# Patient Record
Sex: Female | Born: 1950
Health system: Southern US, Community
[De-identification: ages and names within clinical notes are randomized; demographics above are authoritative.]

## PROBLEM LIST (undated history)

## (undated) DIAGNOSIS — Z973 Presence of spectacles and contact lenses: Secondary | ICD-10-CM

## (undated) DIAGNOSIS — R011 Cardiac murmur, unspecified: Secondary | ICD-10-CM

## (undated) DIAGNOSIS — E669 Obesity, unspecified: Secondary | ICD-10-CM

## (undated) DIAGNOSIS — H905 Unspecified sensorineural hearing loss: Secondary | ICD-10-CM

## (undated) DIAGNOSIS — IMO0001 Reserved for inherently not codable concepts without codable children: Secondary | ICD-10-CM

## (undated) DIAGNOSIS — J45909 Unspecified asthma, uncomplicated: Secondary | ICD-10-CM

## (undated) DIAGNOSIS — G43909 Migraine, unspecified, not intractable, without status migrainosus: Secondary | ICD-10-CM

## (undated) DIAGNOSIS — E039 Hypothyroidism, unspecified: Secondary | ICD-10-CM

## (undated) DIAGNOSIS — M1990 Unspecified osteoarthritis, unspecified site: Secondary | ICD-10-CM

## (undated) DIAGNOSIS — Z972 Presence of dental prosthetic device (complete) (partial): Secondary | ICD-10-CM

## (undated) DIAGNOSIS — T753XXA Motion sickness, initial encounter: Secondary | ICD-10-CM

## (undated) DIAGNOSIS — I10 Essential (primary) hypertension: Secondary | ICD-10-CM

## (undated) DIAGNOSIS — K219 Gastro-esophageal reflux disease without esophagitis: Secondary | ICD-10-CM

## (undated) HISTORY — PX: CERVICAL CONE BIOPSY: SUR198

## (undated) HISTORY — DX: Obesity, unspecified: E66.9

---

## 2007-09-03 ENCOUNTER — Ambulatory Visit: Payer: Self-pay | Admitting: Nurse Practitioner

## 2007-09-04 ENCOUNTER — Ambulatory Visit: Payer: Self-pay | Admitting: Internal Medicine

## 2008-12-14 ENCOUNTER — Ambulatory Visit: Payer: Self-pay | Admitting: Internal Medicine

## 2009-12-15 ENCOUNTER — Ambulatory Visit: Payer: Self-pay | Admitting: Internal Medicine

## 2010-09-18 ENCOUNTER — Ambulatory Visit: Payer: Self-pay | Admitting: Gastroenterology

## 2013-10-29 ENCOUNTER — Ambulatory Visit: Payer: Self-pay | Admitting: Gastroenterology

## 2013-10-29 LAB — BASIC METABOLIC PANEL
Anion Gap: 11 (ref 7–16)
BUN: 15 mg/dL (ref 7–18)
Chloride: 108 mmol/L — ABNORMAL HIGH (ref 98–107)
Co2: 21 mmol/L (ref 21–32)
Osmolality: 281 (ref 275–301)
Potassium: 3.6 mmol/L (ref 3.5–5.1)
Sodium: 140 mmol/L (ref 136–145)

## 2013-10-29 LAB — CBC
HCT: 38.9 % (ref 35.0–47.0)
HGB: 13.3 g/dL (ref 12.0–16.0)
MCH: 29.9 pg (ref 26.0–34.0)
MCHC: 34.1 g/dL (ref 32.0–36.0)
MCV: 88 fL (ref 80–100)
RBC: 4.43 10*6/uL (ref 3.80–5.20)
RDW: 13.2 % (ref 11.5–14.5)
WBC: 11.4 10*3/uL — ABNORMAL HIGH (ref 3.6–11.0)

## 2013-10-29 LAB — CK TOTAL AND CKMB (NOT AT ARMC): CK, Total: 193 U/L (ref 21–215)

## 2013-11-11 ENCOUNTER — Ambulatory Visit: Payer: Self-pay | Admitting: Internal Medicine

## 2014-04-02 HISTORY — PX: THROAT SURGERY: SHX803

## 2014-08-26 IMAGING — MG MM DIGITAL SCREENING BILAT W/ CAD
1 series · 4 of 4 positions shown · non-contrast
Comparison: Previous exam(s).

CLINICAL DATA: Screening.

EXAM:
DIGITAL SCREENING BILATERAL MAMMOGRAM WITH CAD

[R CC · right · 4 of 4 slices shown]
[im 1/4]
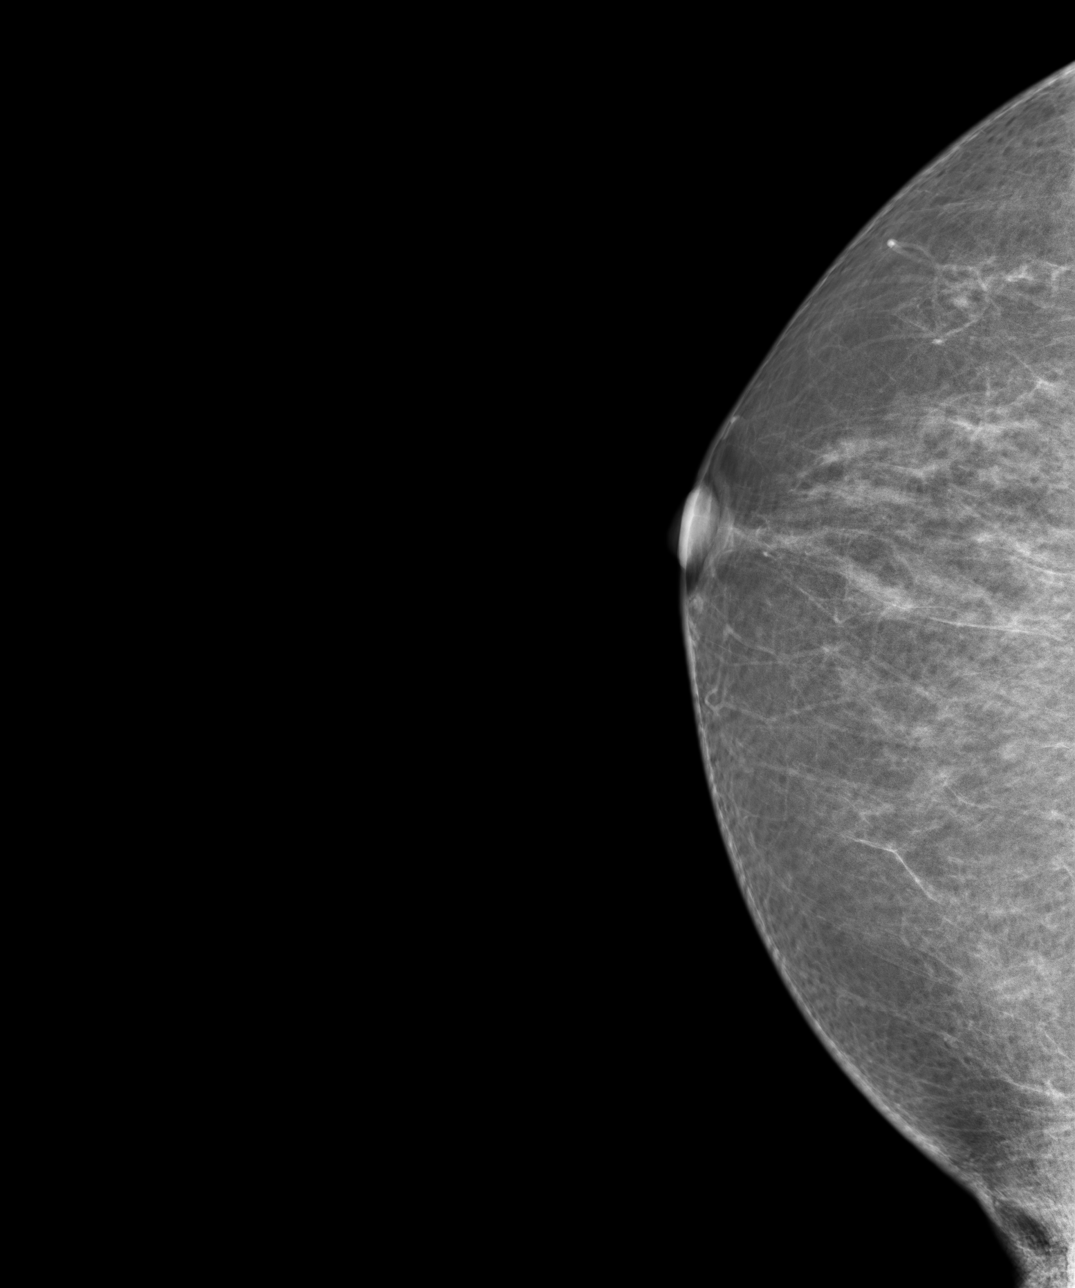
[im 2/4]
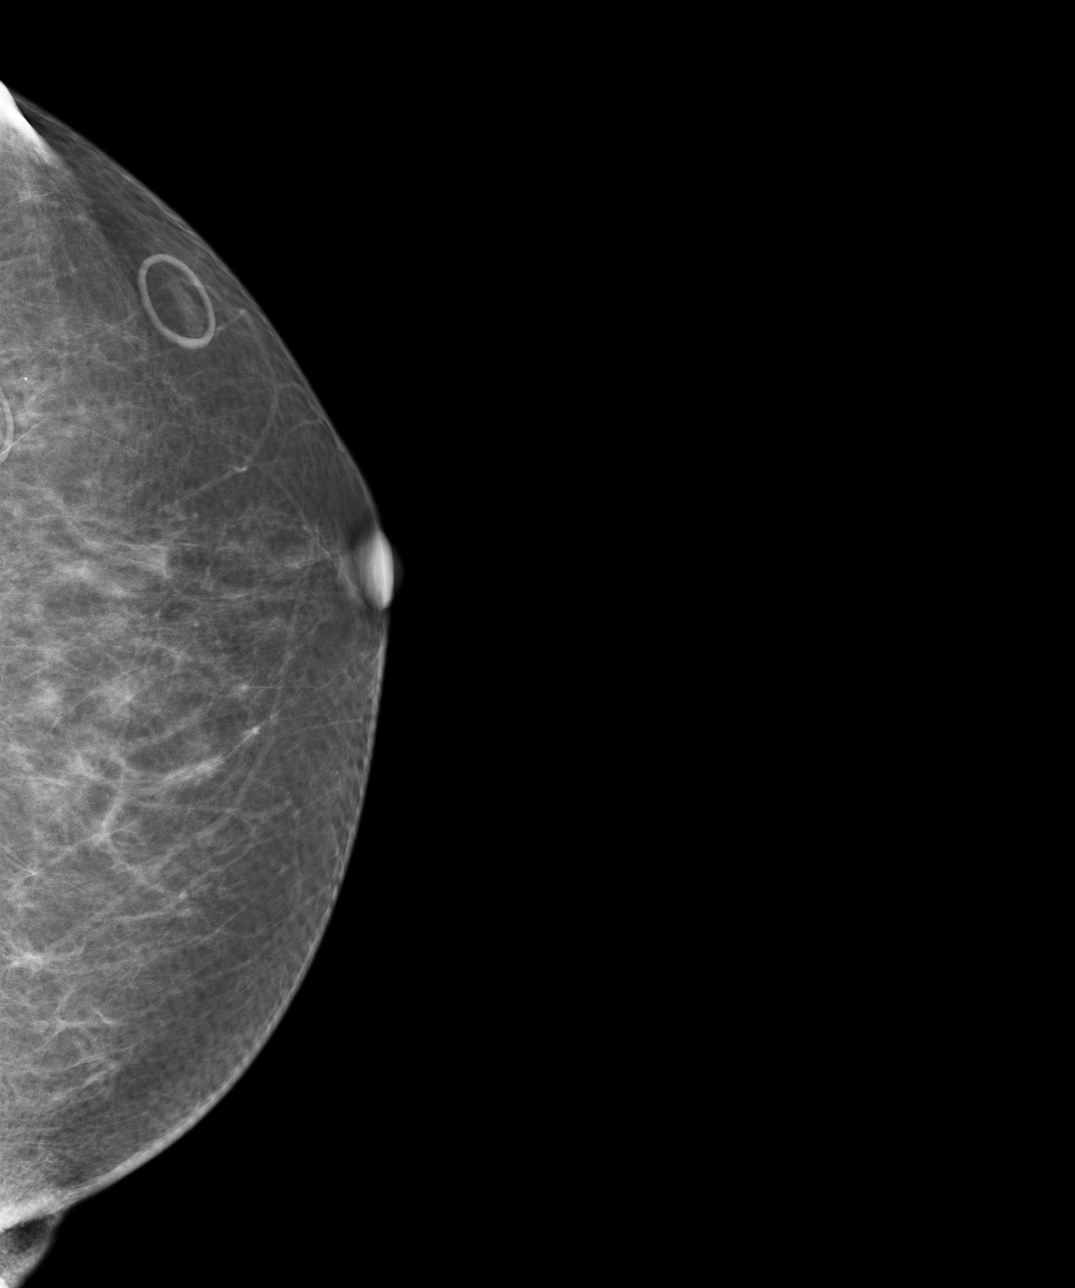
[im 3/4]
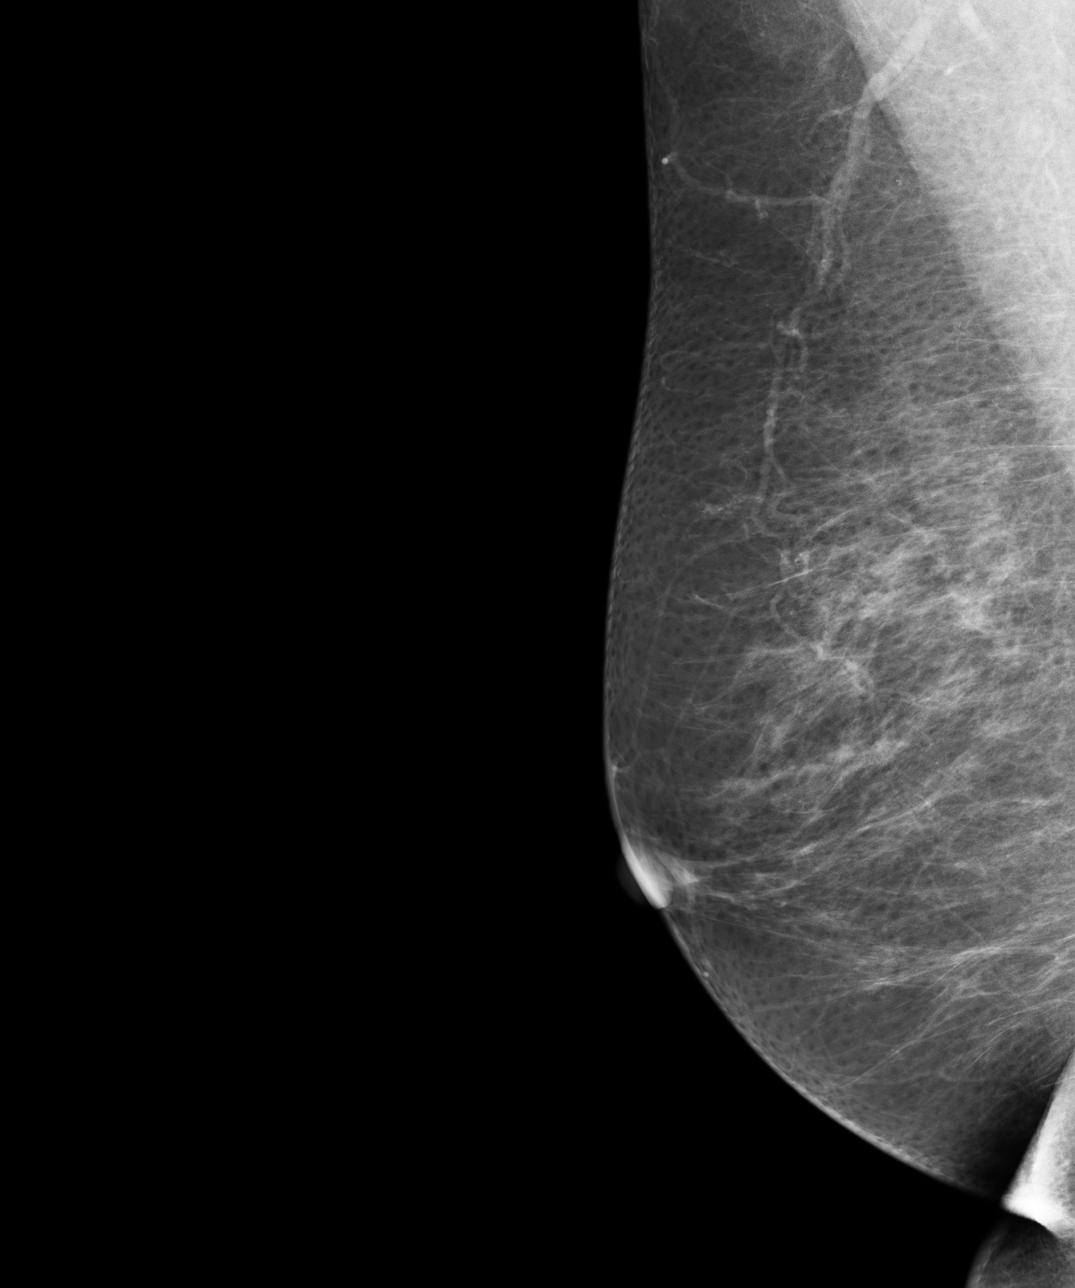
[im 4/4]
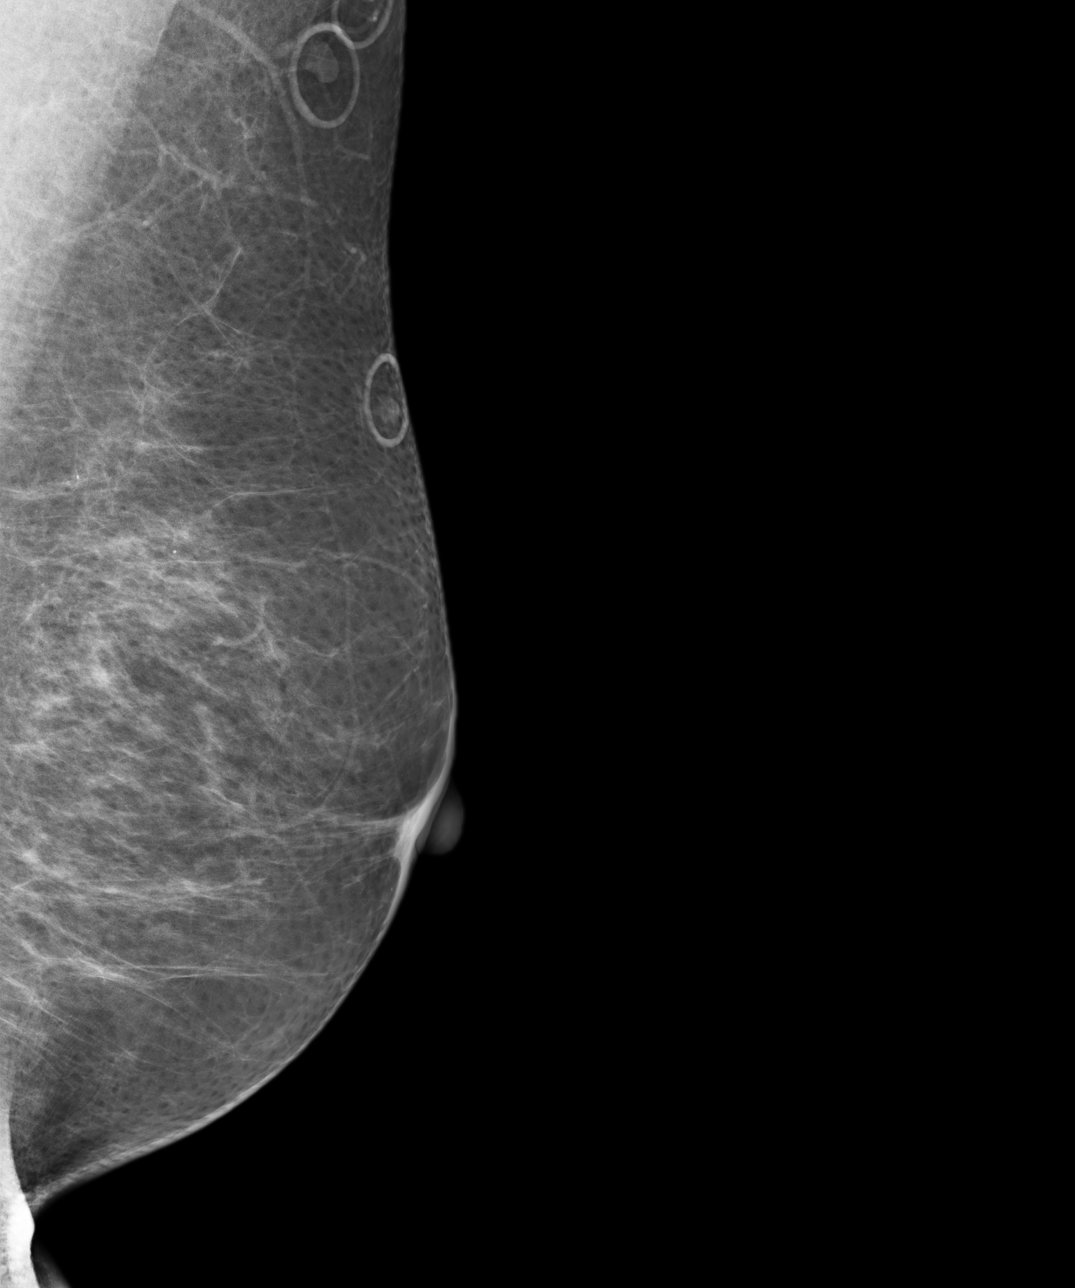

[4 of 4 positions shown; findings below may reference images not displayed]

ACR Breast Density Category b: There are scattered areas of
fibroglandular density.
FINDINGS: There are no findings suspicious for malignancy. Images were
processed with CAD.
IMPRESSION: No mammographic evidence of malignancy. A result letter of this
screening mammogram will be mailed directly to the patient.

RECOMMENDATION:
Screening mammogram in one year. (Code:GW-8-FX7)

BI-RADS CATEGORY  1: Negative

## 2015-03-25 NOTE — Consult Note (Signed)
PATIENT NAME:  Jillian Fritz, Jillian Fritz MR#:  [id number] DATE OF BIRTH:  26-Feb-1951  DATE OF CONSULTATION:  10/29/2013  CONSULTING PHYSICIAN:  Lollie Sails, MD  REPORT OF CONSULTATION: Jillian Fritz is a 64 year old Caucasian female who I had seen several years ago as an outpatient for a screening colonoscopy. At that time, she had a colonoscopy under monitored anesthesia showing diverticulosis and some hyperplastic polyps. She presented to the Emergency Room today with a possible esophageal foreign body/food impaction. She stated that at 1:00 p.m. this afternoon, she was tasting some roast beef to make sure that it was not overcooked, ate this, and it felt like she had an air pocket in her esophagus. She describes it also as a spasm, this being in the retrosternal area. She has had multiple episodes of these over the past 6 months or so. These tend to be fairly painful. She feels like she cannot get a full breath in. She has had only one episode of vomiting associated with it. She has not been having to regurgitate food. Most of the time, the food will slip through within 10 minutes or so. This one is different than others. She continues to have some retrosternal discomfort and a sensation that she describes as an "air pocket." She states that she rarely has heartburn, may take a TUMS once a month. She has not been having to regurgitate food. She has been having no abdominal pain. GI family history is negative for colorectal cancer, liver disease, or ulcers.   PAST MEDICAL HISTORY: History of hypercholesterolemia; however, due to side effects she quit taking the medications a while back. She has a history of migraine headaches. She denies any history of diabetes, thyroid problems, liver or kidney problems, heart problems, lung problems. The patient denies other medical or surgical problems.   The patient has an allergy to PENICILLIN and SULFA DRUGS. She does not take any outpatient medications. She does not  take anything for heartburn, with the exception of the TUMS as indicated.   PHYSICAL EXAMINATION: VITAL SIGNS: Temperature is 98.2, pulse 78, respirations 16, blood pressure 164/77, pulse ox 98%.  GENERAL: She is a well-appearing, 64 year old Caucasian female in no acute distress.  HEENT: Normocephalic, atraumatic. Eyes are anicteric. Nose: Septum midline. No lesions.  Oropharynx: No lesions.  NECK: No JVD. No thyromegaly.  HEART: Regular rate and rhythm.  LUNGS: Bilaterally clear.  ABDOMEN: Soft, nontender, nondistended. Bowel sounds positive, normoactive.  RECTAL: Anorectal exam is deferred.  EXTREMITIES: No clubbing, cyanosis, or edema.  NEUROLOGICAL: Cranial nerves II through XII grossly intact. Muscle strength bilaterally equal and symmetric. DTRs bilaterally equal and symmetric.   LABORATORIES INCLUDE THE FOLLOWING: She had a chemistry panel, including a glucose of 107, BUN 15, creatinine 0.84, sodium 140, potassium 3.6, chloride 108, bicarb 21, osmolality 281, calcium 9.1. Cardiac with a CK total of 193, CPK-MB 1.6, troponin I x 1 less than 0.02. Her hemogram shows a white count of 11.4, Hof 13.3/38.9, platelet count of 188, MCV is 88. There was a PA and lateral chest film done due to complaint of chest pressure, this showing no acute cardiopulmonary disease. She did have an EKG showing ST depression in the inferior leads. A repeat EKG was obtained a couple of hours later that had seemed to improve.   ASSESSMENT:  1. Possible esophageal foreign body versus spasm. The patient currently is spitting up clear fluids. 2.  Abnormal EKG.   RECOMMENDATIONS: The patient was discussed with Dr. Conni Slipper in  the ER. I have recommended that we give her a trial of some glucagon to see if this can be alleviated without sedated procedure. If this is unsuccessful, will need to proceed with EGD under general anesthesia for airway protection. In regards to the abnormal EKG, I have discussed this with  Anesthesia, and we will need opinion from Internal Medicine in regards to this problem. Likely the procedure will need to be done if the apparent blockage is not alleviated by the glucagon injection. I have discussed the risks, benefits, and complications of EGD and sedation and foreign body removal with the patient and her sister, who accompanies her, to include but not limited to bleeding, infection, perforation, and the risk of sedation, and she wishes to proceed.      ____________________________ Lollie Sails, MD mus:mr D: 10/29/2013 18:55:00 ET T: 10/29/2013 19:04:54 ET JOB#: [id number]  cc: Lollie Sails, MD, <Dictator> Lollie Sails MD ELECTRONICALLY SIGNED 11/09/2013 17:19

## 2015-03-25 NOTE — Consult Note (Signed)
Brief Consult Note: Comments: 64 yr old femalew  with no pmh comes in  with piece of meat getting stuck in chest with vomting sensation . pt resports food getting stuck  with associated vomting for the pas 6 months EKG revies had mild stdepresion inlateral leads which resolved on the following EKG at this time she is at low risk to have EGd  needs work up as out pt like stress test.explained to her,no previous EKGS.  Electronic Signatures: Epifanio Lesches (MD)  (Signed [phone] 20:10)  Authored: Brief Consult Note   Last Updated: 27-Nov-14 20:10 by Epifanio Lesches (MD)

## 2015-03-25 NOTE — Consult Note (Signed)
Brief Consult Note: Diagnosis: dysphagia, possible esophageal food bolus impaction, abnormal ekg.   Patient was seen by consultant.   Consult note dictated.   Recommend further assessment or treatment.   Discussed with Attending MD.   Comments: Please see full GI consult [phone].  Patietn presenting with possible esophageal food impaction versus esophageal spasm.  Patient also with abnormal EKG.  Case discussed with Dr Cinda Quest, we will try some glucagon and if ineffective, will proceed with EGD for evaluation and removal of food bolus impaction. I have discussedn the risks benefits and complicatiosn of egd and food bolus removal to include not limited to bleeding infection pperforation and sedation and she wishes to proceed.  Will ask for Internal Medicine opinion on the EKG changes.  Electronic Signatures: Loistine Simas (MD)  (Signed [phone] 19:00)  Authored: Brief Consult Note   Last Updated: 27-Nov-14 19:00 by Loistine Simas (MD)

## 2015-03-25 NOTE — Consult Note (Signed)
PATIENT NAME:  Jillian Fritz, COHENOUR MR#:  [id number] DATE OF BIRTH:  Mar 25, 1951  DATE OF CONSULTATION:  10/29/2013  PRIMARY CARE PHYSICIAN:  None. CONSULTING PHYSICIAN:  Epifanio Lesches, MD EMERGENCY ROOM PHYSICIAN: Conni Slipper, MD Consult REQUESTING PHYSICIAN: Loistine Simas, MD  REASON FOR CONSULT: EKG abnormality. The patient needing endoscopy.   HISTORY OF PRESENT ILLNESS: A 64 year old female with no past medical history came to the ER after she had an episode of choking after she ate a piece of meat. The patient was evaluated in the Emergency Room and referred to EGD for further evaluation of the problem.   The patient says that she feels like she has something stuck in her chest and feels like getting it out and not able to do it. This happens only after she eats food. According to her, it is going on for 6 months, and sometimes she has projectile vomiting because of the food getting stuck in her chest. The patient tried a piece of beef today after she prepared a whole Thanksgiving dinner and tried to see if the beef is cooked or not. She tried a piece. Immediately after that, she  had chest  pain, with a gripping type of pain and felt like her meat is stuck in her chest. The patient still had discomfort and fullness until she came into the Emergency Room. Denies any abdominal pain. No vomiting. No cough. No sweating. No shortness of breath.   PAST MEDICAL HISTORY: No hypertension. No diabetes.   MEDICATIONS: No medications.   ALLERGIES: SULFA AND PENICILLIN. PENICILLIN GIVES ANGIOEDEMA AND SHORTNESS OF BREATH. SULFA GIVES HIVES.   SOCIAL HISTORY: No smoking, no drinking.   FAMILY HISTORY: Significant for aunt had esophageal stricture which was dilated and also aunt has hypertension.   REVIEW OF SYSTEMS: CONSTITUTIONAL: No fever. No fatigue. No weakness.  EYES: No blurred vision. No inflammation.  ENT: No tinnitus. No epistaxis. The patient does have trouble with her food getting  stuck in her chest.  RESPIRATORY: No cough.  CARDIOVASCULAR: Feels like the food getting stuck in the chest and feels heavy in there.  GASTROINTESTINAL: No nausea. The patient feels vomiting sensation and feels like air bubble is stuck in her chest and trying to get it out with feeling of dry heaving. No abdominal pain. No hematemesis.  GENITOURINARY: No dysuria.  HEMATOLOGIC: No anemia.  INTEGUMENTARY: No skin rashes.  MUSCULOSKELETAL: No joint pains.  NEUROLOGIC: No numbness or weakness. No dysarthria.  PSYCHIATRIC: No anxiety or insomnia.   PHYSICAL EXAMINATION:  VITAL SIGNS: Temperature 98.2. Heart rate is 78 and blood pressure 164/77, sats 98%.  GENERAL: A well-appearing 64 year old Caucasian female not in distress, answering questions appropriately.  HEENT: Normocephalic, atraumatic.  EYES: Pupils are equally reacting to light. No conjunctival icterus, no scleral icterus.  NOSE: No turbinate hypertrophy. No drainage.  EARS: No tympanic membrane congestion. No external lesions.  MOUTH: No lesions. No exudates.  NECK: Supple. No JVD. No carotid bruit. Normal range of motion.  RESPIRATORY: Clear to auscultation. No wheeze. No rales. CARDIOVASCULAR: Regular rate and rhythm. No murmurs. Cardiac palpation is within normal limits. Pulses are equal in carotid, femoral and dorsalis pedis regions. No peripheral edema.  ABDOMEN: Nontender, nondistended. Bowel sounds present.  MUSCULOSKELETAL: Normal gait and station.  EXTREMITIES: Moves x4. No tenderness or effusion.  NEUROLOGIC: Cranial nerves II through XII intact. Power 5/5 in upper and lower extremities. Deep tendon reflexes 2+ bilaterally. Sensations are intact.  PSYCHIATRIC: Motor and affect are within normal  limits.   LABORATORY DATA: Electrolytes: Sodium 140, potassium 3.6, chloride 108, bicarb 21, BUN 15, creatinine 0.87, glucose 107. CPK-MB 1.6. Troponin less than 0.2.   Chest x-ray shows no acute cardiopulmonary abnormality.    WBC 7.4, hemoglobin 13.3, hematocrit 38.9, platelets 188.   The patient's fast EKG that was done around 4:00 p.m. shows some ST depression in V4, V5 and V6, which has normalized on following EKG that was done at 5:00 p.m. The patient's rate is 70 and normal sinus rhythm.   ASSESSMENT AND PLAN: The patient is a 64 year old female patient with no past medical history, not seen a primary doctor for years, came in because of feeling of chest discomfort after eating meat. The patient possibly had a foreign body versus esophageal spasm versus esophageal stricture. The patient says that it is going on for 6 months, so the patient will benefit from esophagogastroduodenoscopy and Dr. Gustavo Lah is already on the case, but the patient needed clearance because of her abdominal EKG initially, but the patient's further EKGs are normal. Denies any chest pain. No previous heart problems.   The patient advised that she needs cardiac work-up after esophagogastroduodenoscopy after she goes home, but that she is at low risk to have an esophagogastroduodenoscopy at this time and condition at this time is stable. The patient can definitely have an esophagogastroduodenoscopy, at low risk for esophagogastroduodenoscopy, and discussed this with an endoscopy nurse and discussed with the patient that she needs a cardiac work-up once she is discharged, probably like a stress test, and she needs to establish with a primary doctor.   TIME SPENT: About 60 minutes on this consult.   ____________________________ Epifanio Lesches, MD sk:np D: 10/29/2013 20:16:56 ET T: 10/29/2013 21:30:12 ET JOB#: [id number]  cc: Epifanio Lesches, MD, <Dictator> Epifanio Lesches MD ELECTRONICALLY SIGNED 11/16/2013 13:45

## 2015-12-05 ENCOUNTER — Ambulatory Visit
Admission: EM | Admit: 2015-12-05 | Discharge: 2015-12-05 | Disposition: A | Discharge status: Home / Self Care | Payer: 59 | Attending: Family Medicine | Admitting: Family Medicine

## 2015-12-05 DIAGNOSIS — J0100 Acute maxillary sinusitis, unspecified: Secondary | ICD-10-CM | POA: Diagnosis not present

## 2015-12-05 HISTORY — DX: Unspecified asthma, uncomplicated: J45.909

## 2015-12-05 HISTORY — DX: Cardiac murmur, unspecified: R01.1

## 2015-12-05 LAB — RAPID STREP SCREEN (MED CTR MEBANE ONLY): STREPTOCOCCUS, GROUP A SCREEN (DIRECT): NEGATIVE

## 2015-12-05 MED ORDER — HYDROCOD POLST-CPM POLST ER 10-8 MG/5ML PO SUER: 5.0000 mL | Freq: Two times a day (BID) | ORAL | Status: DC

## 2015-12-05 MED ORDER — FLUTICASONE PROPIONATE 50 MCG/ACT NA SUSP: 2.0000 | Freq: Every day | NASAL | Status: DC

## 2015-12-05 MED ORDER — AZITHROMYCIN 250 MG PO TABS: ORAL_TABLET | ORAL | Status: DC

## 2015-12-05 NOTE — ED Provider Notes (Signed)
CSN: [id number]     Arrival date & time 12/05/15  1126 History   First MD Initiated Contact with Patient 12/05/15 1352     Chief Complaint  Patient presents with  . Sore Throat   (Consider location/radiation/quality/duration/timing/severity/associated sxs/prior Treatment) HPI   65 year old female who presents with a sore throat that she's had for 6 days. States it is difficult to swallow fluids and solids. She was around a niece who also had a sore throat but rapid strep a was negative and she is awaiting the culture results. In addition she's had some green nasal congestion and also coughing green mucus. Her symptoms have definitely been worsening. She denies any fever or chills. She has had interruption of her rest due to the coughing at nighttime.  Past Medical History  Diagnosis Date  . Asthma   . Heart murmur    Past Surgical History  Procedure Laterality Date  . Cervical cone biopsy    . Throat surgery  04/2014   Family History  Problem Relation Age of Onset  . Alzheimer's disease Father   . Hypertension Mother    Social History  Substance Use Topics  . Smoking status: Never Smoker   . Smokeless tobacco: None  . Alcohol Use: No   OB History    No data available     Review of Systems  Constitutional: Positive for activity change. Negative for fever, chills, diaphoresis and fatigue.  HENT: Positive for congestion, ear pain, postnasal drip, rhinorrhea, sinus pressure, sneezing, sore throat and trouble swallowing.   Respiratory: Positive for cough. Negative for choking, shortness of breath, wheezing and stridor.   All other systems reviewed and are negative.   Allergies  Penicillins and Sulfa antibiotics  Home Medications   Prior to Admission medications   Medication Sig Start Date End Date Taking? Authorizing Provider  azithromycin (ZITHROMAX Z-PAK) 250 MG tablet Use as per package instructions 12/05/15   Lorin Picket, PA-C  chlorpheniramine-HYDROcodone  [facility]) 10-8 MG/5ML SUER Take 5 mLs by mouth 2 (two) times daily. 12/05/15   Lorin Picket, PA-C  fluticasone (FLONASE) 50 MCG/ACT nasal spray Place 2 sprays into both nostrils daily. 12/05/15   Lorin Picket, PA-C   Meds Ordered and Administered this Visit  Medications - No data to display  BP 170/85 mmHg  Pulse 79  Temp(Src) 98 F (36.7 C) (Tympanic)  Resp 17  Ht 5' 5.5" (1.664 m)  Wt 180 lb (81.647 kg)  BMI 29.49 kg/m2  SpO2 100% No data found.   Physical Exam  Constitutional: She is oriented to person, place, and time. She appears well-developed and well-nourished. No distress.  HENT:  Head: Normocephalic and atraumatic.  Right TM is very dull. Left appears normal. Tenderness in the throat but there is no adenopathy palpable. The oropharynx does not look very erythematous swollen and there is no petechiae and no exudate seen.  Neck: Normal range of motion. Neck supple. No thyromegaly present.  Pulmonary/Chest: Effort normal and breath sounds normal. No stridor. No respiratory distress. She has no wheezes. She has no rales.  Musculoskeletal: Normal range of motion. She exhibits no edema or tenderness.  Lymphadenopathy:    She has no cervical adenopathy.  Neurological: She is alert and oriented to person, place, and time.  Skin: Skin is warm and dry. No rash noted. She is not diaphoretic. No erythema.  Psychiatric: She has a normal mood and affect. Her behavior is normal. Judgment and thought content normal.  Nursing note and vitals reviewed.   ED Course  Procedures (including critical care time)  Labs Review Labs Reviewed  RAPID STREP SCREEN (NOT AT [facility])  CULTURE, GROUP A STREP ([facility] ONLY)    Imaging Review No results found.   Visual Acuity Review  Right Eye Distance:   Left Eye Distance:   Bilateral Distance:    Right Eye Near:   Left Eye Near:    Bilateral Near:         MDM   1. Acute maxillary sinusitis, recurrence not  specified    Discharge Medication List as of 12/05/2015  2:06 PM    START taking these medications   Details  azithromycin (ZITHROMAX Z-PAK) 250 MG tablet Use as per package instructions, Normal    chlorpheniramine-HYDROcodone (TUSSIONEX PENNKINETIC ER) 10-8 MG/5ML SUER Take 5 mLs by mouth 2 (two) times daily., Starting 12/05/2015, Until Discontinued, Print    fluticasone (FLONASE) 50 MCG/ACT nasal spray Place 2 sprays into both nostrils daily., Starting 12/05/2015, Until Discontinued, Normal      Plan: 1. Test/x-ray results and diagnosis reviewed with patient 2. rx as per orders; risks, benefits, potential side effects reviewed with patient 3. Recommend supportive treatment with fluids and rest. May take Tylenol or ibuprofen for body aches and pain. Recommend use cool mist vaporizer at nighttime. She was cautioned regarding the use of the Tussionex with activities that required judgment or concentration or while driving 4. F/u prn if symptoms worsen or don't improve      Lorin Picket, PA-C 12/05/15 1414

## 2015-12-05 NOTE — Discharge Instructions (Signed)
Sinus Rinse WHAT IS A SINUS RINSE? A sinus rinse is a simple home treatment that is used to rinse your sinuses with a sterile mixture of salt and water (saline solution). Sinuses are air-filled spaces in your skull behind the bones of your face and forehead that open into your nasal cavity. You will use the following:  Saline solution.  Neti pot or spray bottle. This releases the saline solution into your nose and through your sinuses. Neti pots and spray bottles can be purchased at [employer], a health food store, or online. WHEN WOULD I DO A SINUS RINSE? A sinus rinse can help to clear mucus, dirt, dust, or pollen from the nasal cavity. You may do a sinus rinse when you have a cold, a virus, nasal allergy symptoms, a sinus infection, or stuffiness in the nose or sinuses. If you are considering a sinus rinse:  Ask your child's health care provider before performing a sinus rinse on your child.  Do not do a sinus rinse if you have had ear or nasal surgery, ear infection, or blocked ears. HOW DO I DO A SINUS RINSE?  Wash your hands.  Disinfect your device according to the directions provided and then dry it.  Use the solution that comes with your device or one that is sold separately in stores. Follow the mixing directions on the package.  Fill your device with the amount of saline solution as directed by the device instructions.  Stand over a sink and tilt your head sideways over the sink.  Place the spout of the device in your upper nostril (the one closer to the ceiling).  Gently pour or squeeze the saline solution into the nasal cavity. The liquid should drain to the lower nostril if you are not overly congested.  Gently blow your nose. Blowing too hard may cause ear pain.  Repeat in the other nostril.  Clean and rinse your device with clean water and then air-dry it. ARE THERE RISKS OF A SINUS RINSE?  Sinus rinse is generally very safe and effective. However, there  are a few risks, which include:   A burning sensation in the sinuses. This may happen if you do not make the saline solution as directed. Make sure to follow all directions when making the saline solution.  Infection from contaminated water. This is rare, but possible.  Nasal irritation.   This information is not intended to replace advice given to you by your health care provider. Make sure you discuss any questions you have with your health care provider.   Document Released: 06/16/2014 Document Reviewed: 06/16/2014 Elsevier Interactive Patient Education 2016 [employer].  Sinusitis, Adult Sinusitis is redness, soreness, and inflammation of the paranasal sinuses. Paranasal sinuses are air pockets within the bones of your face. They are located beneath your eyes, in the middle of your forehead, and above your eyes. In healthy paranasal sinuses, mucus is able to drain out, and air is able to circulate through them by way of your nose. However, when your paranasal sinuses are inflamed, mucus and air can become trapped. This can allow bacteria and other germs to grow and cause infection. Sinusitis can develop quickly and last only a short time (acute) or continue over a long period (chronic). Sinusitis that lasts for more than 12 weeks is considered chronic. CAUSES Causes of sinusitis include:  Allergies.  Structural abnormalities, such as displacement of the cartilage that separates your nostrils (deviated septum), which can decrease the air flow  through your nose and sinuses and affect sinus drainage.  Functional abnormalities, such as when the small hairs (cilia) that line your sinuses and help remove mucus do not work properly or are not present. SIGNS AND SYMPTOMS Symptoms of acute and chronic sinusitis are the same. The primary symptoms are pain and pressure around the affected sinuses. Other symptoms include:  Upper toothache.  Earache.  Headache.  Bad breath.  Decreased  sense of smell and taste.  A cough, which worsens when you are lying flat.  Fatigue.  Fever.  Thick drainage from your nose, which often is green and may contain pus (purulent).  Swelling and warmth over the affected sinuses. DIAGNOSIS Your health care provider will perform a physical exam. During your exam, your health care provider may perform any of the following to help determine if you have acute sinusitis or chronic sinusitis:  Look in your nose for signs of abnormal growths in your nostrils (nasal polyps).  Tap over the affected sinus to check for signs of infection.  View the inside of your sinuses using an imaging device that has a light attached (endoscope). If your health care provider suspects that you have chronic sinusitis, one or more of the following tests may be recommended:  Allergy tests.  Nasal culture. A sample of mucus is taken from your nose, sent to a lab, and screened for bacteria.  Nasal cytology. A sample of mucus is taken from your nose and examined by your health care provider to determine if your sinusitis is related to an allergy. TREATMENT Most cases of acute sinusitis are related to a viral infection and will resolve on their own within 10 days. Sometimes, medicines are prescribed to help relieve symptoms of both acute and chronic sinusitis. These may include pain medicines, decongestants, nasal steroid sprays, or saline sprays. However, for sinusitis related to a bacterial infection, your health care provider will prescribe antibiotic medicines. These are medicines that will help kill the bacteria causing the infection. Rarely, sinusitis is caused by a fungal infection. In these cases, your health care provider will prescribe antifungal medicine. For some cases of chronic sinusitis, surgery is needed. Generally, these are cases in which sinusitis recurs more than 3 times per year, despite other treatments. HOME CARE INSTRUCTIONS  Drink plenty of  water. Water helps thin the mucus so your sinuses can drain more easily.  Use a humidifier.  Inhale steam 3-4 times a day (for example, sit in the bathroom with the shower running).  Apply a warm, moist washcloth to your face 3-4 times a day, or as directed by your health care provider.  Use saline nasal sprays to help moisten and clean your sinuses.  Take medicines only as directed by your health care provider.  If you were prescribed either an antibiotic or antifungal medicine, finish it all even if you start to feel better. SEEK IMMEDIATE MEDICAL CARE IF:  You have increasing pain or severe headaches.  You have nausea, vomiting, or drowsiness.  You have swelling around your face.  You have vision problems.  You have a stiff neck.  You have difficulty breathing.   This information is not intended to replace advice given to you by your health care provider. Make sure you discuss any questions you have with your health care provider.   Document Released: 11/19/2005 Document Revised: 12/10/2014 Document Reviewed: 12/04/2011 Elsevier Interactive Patient Education [employer].

## 2015-12-05 NOTE — ED Notes (Signed)
Patient complains of sore throat for the last 6 days. Patient states that it hurts to swallow fluids and solids. She states that she was around her niece whom had strep. She states that she has also had some green nasal congestion and swollen glands. She states that she feels like her symptoms have been constant and worsening.

## 2015-12-07 ENCOUNTER — Telehealth: Payer: Self-pay | Admitting: *Deleted

## 2015-12-07 LAB — CULTURE, GROUP A STREP (THRC)

## 2015-12-07 NOTE — ED Notes (Signed)
Called and informed patient that her strep culture came back negative. Patient confirmed understanding of information.

## 2016-01-09 ENCOUNTER — Ambulatory Visit (INDEPENDENT_AMBULATORY_CARE_PROVIDER_SITE_OTHER): Payer: 59 | Admitting: Family Medicine

## 2016-01-09 ENCOUNTER — Encounter: Payer: Self-pay | Admitting: Family Medicine

## 2016-01-09 VITALS — BP 144/88 | HR 68 | Temp 98.0°F | Resp 16 | Ht 65.0 in | Wt 186.0 lb

## 2016-01-09 DIAGNOSIS — K222 Esophageal obstruction: Secondary | ICD-10-CM

## 2016-01-09 DIAGNOSIS — K219 Gastro-esophageal reflux disease without esophagitis: Secondary | ICD-10-CM | POA: Diagnosis not present

## 2016-01-09 DIAGNOSIS — I10 Essential (primary) hypertension: Secondary | ICD-10-CM | POA: Diagnosis not present

## 2016-01-09 DIAGNOSIS — Z1239 Encounter for other screening for malignant neoplasm of breast: Secondary | ICD-10-CM

## 2016-01-09 DIAGNOSIS — R0789 Other chest pain: Secondary | ICD-10-CM

## 2016-01-09 DIAGNOSIS — I129 Hypertensive chronic kidney disease with stage 1 through stage 4 chronic kidney disease, or unspecified chronic kidney disease: Secondary | ICD-10-CM | POA: Insufficient documentation

## 2016-01-09 DIAGNOSIS — E785 Hyperlipidemia, unspecified: Secondary | ICD-10-CM

## 2016-01-09 DIAGNOSIS — N182 Chronic kidney disease, stage 2 (mild): Secondary | ICD-10-CM

## 2016-01-09 DIAGNOSIS — E782 Mixed hyperlipidemia: Secondary | ICD-10-CM | POA: Insufficient documentation

## 2016-01-09 MED ORDER — LISINOPRIL 10 MG PO TABS: 10.0000 mg | ORAL_TABLET | Freq: Every day | ORAL | Status: DC

## 2016-01-09 MED ORDER — PANTOPRAZOLE SODIUM 40 MG PO TBEC
40.0000 mg | DELAYED_RELEASE_TABLET | Freq: Every day | ORAL | Status: DC
Start: 2016-01-09 — End: 2016-05-06

## 2016-01-09 NOTE — Patient Instructions (Signed)
Your goal blood pressure is 140/90 Work on low salt/sodium diet - goal <1.5gm (1,500mg) per day. Eat a diet high in fruits/vegetables and whole grains.  Look into mediterranean and DASH diet. Goal activity is 150min/wk of moderate intensity exercise.  This can be split into 30 minute chunks.  If you are not at this level, you can start with smaller 10-15 min increments and slowly build up activity. Look at www.heart.org for more resources  Please seek immediate medical attention at ER or Urgent Care if you develop: Chest pain, pressure or tightness. Shortness of breath accompanied by nausea or diaphoresis Visual changes Numbness or tingling on one side of the body Facial droop Altered mental status Or any concerning symptoms.   

## 2016-01-09 NOTE — Progress Notes (Signed)
Subjective:    Patient ID: Jillian Fritz, female    DOB: [birth year], 65 y.o.   MRN: [id number]  HPI: Jillian Fritz is a 64 y.o. female presenting on 01/09/2016 for Establish Care   HPI  Pt presents to establish care today. Previous care provider was [facility].  It has been years years since Her last PCP visit. Records from previous provider will be requested and reviewed. Current medical problems include:  Hypertension: Found on health screening at work. Was 160/104. She has been checking at home: 140-150/80 avg. Some chest tightness in the past few months- 1-2 times per month. Starts and goes up the neck. Lasts for a few minutes- does make her feel nauseated.  Has only happened at work. She has never seen a provider about this issue. No associated SOB or nausea.   Cholesterol: Found to be high in normal health screening.  Esophageal spasms- History of GERD with esophageal spams. Had beef stuck there and removed in ER. Was scheduled for endoscopy but she cancelled. Was on Protonix in the past. Occasional dysphagia symptoms. Daily heartburn. No regurg or bloody vomit.   Health maintenance:  Pap: Unsure. Cone biopsy in the past. Mammogram: 3 years ago. Normal.    Past Medical History  Diagnosis Date  . Asthma   . Heart murmur    Social History   Social History  . Marital Status: Single    Spouse Name: N/A  . Number of Children: N/A  . Years of Education: N/A   Occupational History  . Not on file.   Social History Main Topics  . Smoking status: Never Smoker   . Smokeless tobacco: Not on file  . Alcohol Use: No  . Drug Use: No  . Sexual Activity: Not on file   Other Topics Concern  . Not on file   Social History Narrative  . No narrative on file   Family History  Problem Relation Age of Onset  . Alzheimer's disease Father   . Hypertension Mother    No current outpatient prescriptions on file prior to visit.   No current facility-administered  medications on file prior to visit.    Review of Systems  Constitutional: Negative for fever and chills.  HENT: Negative.   Respiratory: Negative for cough, chest tightness and wheezing.   Cardiovascular: Positive for chest pain. Negative for leg swelling.  Gastrointestinal: Positive for abdominal pain (GERD symptpoms). Negative for nausea, vomiting, diarrhea and constipation.  Endocrine: Negative.  Negative for cold intolerance, heat intolerance, polydipsia, polyphagia and polyuria.  Genitourinary: Negative for dysuria and difficulty urinating.  Musculoskeletal: Negative.   Neurological: Negative for dizziness, light-headedness and numbness.  Psychiatric/Behavioral: Negative.    Per HPI unless specifically indicated above     Objective:    BP 144/88 mmHg  Pulse 68  Temp(Src) 98 F (36.7 C) (Oral)  Resp 16  Ht 5\' 5"  (1.651 m)  Wt 186 lb (84.369 kg)  BMI 30.95 kg/m2  Wt Readings from Last 3 Encounters:  01/09/16 186 lb (84.369 kg)  12/05/15 180 lb (81.647 kg)    Physical Exam  Constitutional: She is oriented to person, place, and time. She appears well-developed and well-nourished.  HENT:  Head: Normocephalic and atraumatic.  Neck: Neck supple.  Cardiovascular: Normal rate, regular rhythm and normal heart sounds.  Exam reveals no gallop and no friction rub.   No murmur heard. Pulmonary/Chest: Effort normal and breath sounds normal. She has no wheezes. She exhibits no tenderness.  Abdominal: Soft. Normal appearance and bowel sounds are normal. She exhibits no distension and no mass. There is no tenderness. There is no rebound and no guarding.  Musculoskeletal: Normal range of motion. She exhibits no edema or tenderness.  Lymphadenopathy:    She has no cervical adenopathy.  Neurological: She is alert and oriented to person, place, and time.  Skin: Skin is warm and dry.   Results for orders placed or performed during the hospital encounter of 12/05/15  Rapid strep screen    Result Value Ref Range   Streptococcus, Group A Screen (Direct) NEGATIVE NEGATIVE  Culture, group A strep ([facility] only)  Result Value Ref Range   Specimen Description THROAT    Special Requests NONE Reflexed from [id number]    Culture NO BETA HEMOLYTIC STREPTOCOCCI ISOLATED    Report Status 12/07/2015 FINAL       Assessment & Plan:   Problem List Items Addressed This Visit      Cardiovascular and Mediastinum   Hypertension - Primary    Treat for hypertension. Goal 140/90. Start lisinopril 10 mg daily. Check CMET. Encouraged DASH Diet. Recheck 1 mos.       Relevant Medications   lisinopril (PRINIVIL,ZESTRIL) 10 MG tablet   Other Relevant Orders   Comprehensive Metabolic Panel (CMET)     Digestive   GERD with stricture    Found in ER in the past. Pt was on Protonix but lost to follow-up. Needs UTD endoscopy. Restart protonix today. Alarm symptoms reviewed. Recheck in 1 mos.       Relevant Medications   pantoprazole (PROTONIX) 40 MG tablet   Other Relevant Orders   Ambulatory referral to General Surgery     Other   Hyperlipidemia    Recheck fasting lipid panel.       Relevant Medications   lisinopril (PRINIVIL,ZESTRIL) 10 MG tablet   Other Relevant Orders   Lipid Profile    Other Visit Diagnoses    Chest tightness        ECG unchanged from previous. Might be GERD related. Pt encouraged to seek medical attention if it occurs again. CMP, lipids, TSH, CBC. Cardiology if not improve    Relevant Orders    EKG 12-Lead    CBC with Differential    TSH    Screening for breast cancer        Relevant Orders    MM Digital Screening       Meds ordered this encounter  Medications  . fluticasone (FLONASE) 50 MCG/ACT nasal spray    Sig: PLACE 2 SPRAYS INTO BOTH NOSTRILS DAILY.    Refill:  0  . lisinopril (PRINIVIL,ZESTRIL) 10 MG tablet    Sig: Take 1 tablet (10 mg total) by mouth daily.    Dispense:  30 tablet    Refill:  11    Order Specific Question:  Supervising Provider     Answer:  Arlis Porta [phone]  . pantoprazole (PROTONIX) 40 MG tablet    Sig: Take 1 tablet (40 mg total) by mouth daily.    Dispense:  30 tablet    Refill:  3    Order Specific Question:  Supervising Provider    Answer:  Arlis Porta [id number]      Follow up plan: Return in about 4 weeks (around 02/06/2016) for HTN, GERD. Marland Kitchen

## 2016-01-09 NOTE — Assessment & Plan Note (Signed)
Recheck fasting lipid panel 

## 2016-01-09 NOTE — Assessment & Plan Note (Signed)
Treat for hypertension. Goal 140/90. Start lisinopril 10 mg daily. Check CMET. Encouraged DASH Diet. Recheck 1 mos.

## 2016-01-09 NOTE — Assessment & Plan Note (Signed)
Found in ER in the past. Pt was on Protonix but lost to follow-up. Needs UTD endoscopy. Restart protonix today. Alarm symptoms reviewed. Recheck in 1 mos.

## 2016-01-14 LAB — CBC WITH DIFFERENTIAL/PLATELET
BASOS: 1 %
Basophils Absolute: 0.1 10*3/uL (ref 0.0–0.2)
EOS (ABSOLUTE): 0.3 10*3/uL (ref 0.0–0.4)
EOS: 4 %
HEMATOCRIT: 40 % (ref 34.0–46.6)
HEMOGLOBIN: 12.8 g/dL (ref 11.1–15.9)
IMMATURE GRANS (ABS): 0 10*3/uL (ref 0.0–0.1)
IMMATURE GRANULOCYTES: 0 %
Lymphocytes Absolute: 2.3 10*3/uL (ref 0.7–3.1)
Lymphs: 36 %
MCH: 29.5 pg (ref 26.6–33.0)
MCHC: 32 g/dL (ref 31.5–35.7)
MCV: 92 fL (ref 79–97)
MONOCYTES: 7 %
Monocytes Absolute: 0.5 10*3/uL (ref 0.1–0.9)
NEUTROS PCT: 52 %
Neutrophils Absolute: 3.4 10*3/uL (ref 1.4–7.0)
Platelets: 214 10*3/uL (ref 150–379)
RBC: 4.34 x10E6/uL (ref 3.77–5.28)
RDW: 14.1 % (ref 12.3–15.4)
WBC: 6.6 10*3/uL (ref 3.4–10.8)

## 2016-01-14 LAB — LIPID PANEL
Chol/HDL Ratio: 4.6 ratio units — ABNORMAL HIGH (ref 0.0–4.4)
Cholesterol, Total: 270 mg/dL — ABNORMAL HIGH (ref 100–199)
HDL: 59 mg/dL (ref >39)
LDL CALC: 189 mg/dL — AB (ref 0–99)
TRIGLYCERIDES: 112 mg/dL (ref 0–149)
VLDL CHOLESTEROL CAL: 22 mg/dL (ref 5–40)

## 2016-01-14 LAB — COMPREHENSIVE METABOLIC PANEL
A/G RATIO: 2.4 (ref 1.1–2.5)
ALBUMIN: 4.5 g/dL (ref 3.6–4.8)
ALK PHOS: 95 IU/L (ref 39–117)
ALT: 24 IU/L (ref 0–32)
AST: 18 IU/L (ref 0–40)
BILIRUBIN TOTAL: 0.5 mg/dL (ref 0.0–1.2)
BUN / CREAT RATIO: 13 (ref 11–26)
BUN: 12 mg/dL (ref 8–27)
CHLORIDE: 106 mmol/L (ref 96–106)
CO2: 22 mmol/L (ref 18–29)
Calcium: 9.2 mg/dL (ref 8.7–10.3)
Creatinine, Ser: 0.93 mg/dL (ref 0.57–1.00)
GFR calc non Af Amer: 66 mL/min/{1.73_m2} (ref >59)
GFR, EST AFRICAN AMERICAN: 76 mL/min/{1.73_m2} (ref >59)
GLOBULIN, TOTAL: 1.9 g/dL (ref 1.5–4.5)
Glucose: 91 mg/dL (ref 65–99)
Potassium: 4.5 mmol/L (ref 3.5–5.2)
SODIUM: 148 mmol/L — AB (ref 134–144)
TOTAL PROTEIN: 6.4 g/dL (ref 6.0–8.5)

## 2016-01-14 LAB — TSH: TSH: 14.28 u[IU]/mL — AB (ref 0.450–4.500)

## 2016-01-16 ENCOUNTER — Telehealth: Payer: Self-pay | Admitting: Family Medicine

## 2016-01-16 NOTE — Telephone Encounter (Signed)
Called pt to review lab results. Released labs to [facility] and asked for patient to call back with questions.

## 2016-01-17 ENCOUNTER — Other Ambulatory Visit: Payer: Self-pay | Admitting: Family Medicine

## 2016-01-17 ENCOUNTER — Telehealth: Payer: Self-pay | Admitting: Family Medicine

## 2016-01-17 DIAGNOSIS — E785 Hyperlipidemia, unspecified: Secondary | ICD-10-CM

## 2016-01-17 DIAGNOSIS — E039 Hypothyroidism, unspecified: Secondary | ICD-10-CM

## 2016-01-17 DIAGNOSIS — E038 Other specified hypothyroidism: Secondary | ICD-10-CM

## 2016-01-17 MED ORDER — EZETIMIBE 10 MG PO TABS: 10.0000 mg | ORAL_TABLET | Freq: Every day | ORAL | Status: DC

## 2016-01-17 MED ORDER — LEVOTHYROXINE SODIUM 25 MCG PO TABS: 25.0000 ug | ORAL_TABLET | Freq: Every day | ORAL | Status: DC

## 2016-01-17 NOTE — Telephone Encounter (Signed)
Pt called for results.

## 2016-01-17 NOTE — Telephone Encounter (Signed)
Called pt, LMTCB.  Please review if she calls back:  Her thyroid stimulating hormone levels are high but thyroid hormone is low. She has subclinical hypothyroidism. However, we should start her on a low dose of thyroid replacement because her TSH level is so high.  We will recheck her thyroid levels in 8-12 weeks. I will give her a lab slip at her next appt.

## 2016-01-19 LAB — THYROID PANEL
Free Thyroxine Index: 1.5 (ref 1.2–4.9)
T3 Uptake Ratio: 27 % (ref 24–39)
T4 TOTAL: 5.7 ug/dL (ref 4.5–12.0)

## 2016-01-19 LAB — T3: T3 TOTAL: 86 ng/dL (ref 71–180)

## 2016-01-19 LAB — SPECIMEN STATUS REPORT

## 2016-02-13 ENCOUNTER — Encounter: Payer: Self-pay | Admitting: Family Medicine

## 2016-02-13 ENCOUNTER — Ambulatory Visit (INDEPENDENT_AMBULATORY_CARE_PROVIDER_SITE_OTHER): Payer: 59 | Admitting: Family Medicine

## 2016-02-13 VITALS — BP 132/88 | HR 67 | Temp 98.2°F | Resp 16 | Ht 65.0 in | Wt 187.0 lb

## 2016-02-13 DIAGNOSIS — E034 Atrophy of thyroid (acquired): Secondary | ICD-10-CM

## 2016-02-13 DIAGNOSIS — E038 Other specified hypothyroidism: Secondary | ICD-10-CM | POA: Diagnosis not present

## 2016-02-13 DIAGNOSIS — E039 Hypothyroidism, unspecified: Secondary | ICD-10-CM | POA: Insufficient documentation

## 2016-02-13 DIAGNOSIS — K219 Gastro-esophageal reflux disease without esophagitis: Secondary | ICD-10-CM | POA: Diagnosis not present

## 2016-02-13 DIAGNOSIS — K222 Esophageal obstruction: Secondary | ICD-10-CM

## 2016-02-13 DIAGNOSIS — I10 Essential (primary) hypertension: Secondary | ICD-10-CM

## 2016-02-13 NOTE — Assessment & Plan Note (Signed)
Recheck TSH in 2-4 weeks to determine dose titration.

## 2016-02-13 NOTE — Patient Instructions (Signed)
Your goal blood pressure is 140/90.  Please call me if your BP's are remaining above 140/90 and we will plan to increase your medications.  Work on low salt/sodium diet - goal <1.5gm (1,500mg ) per day. Eat a diet high in fruits/vegetables and whole grains.  Look into mediterranean and DASH diet. Goal activity is 135min/wk of moderate intensity exercise.  This can be split into 30 minute chunks.  If you are not at this level, you can start with smaller 10-15 min increments and slowly build up activity. Look at [place].org for more resources  Please seek immediate medical attention at ER or Urgent Care if you develop: Chest pain, pressure or tightness. Shortness of breath accompanied by nausea or diaphoresis Visual changes Numbness or tingling on one side of the body Facial droop Altered mental status Or any concerning symptoms.

## 2016-02-13 NOTE — Progress Notes (Signed)
Subjective:    Patient ID: Jillian Fritz, female    DOB: [birth year], 65 y.o.   MRN: [id number]  HPI: Jillian Fritz is a 65 y.o. female presenting on 02/13/2016 for Hypertension   HPI  Pt presents for recheck of blood pressure and GERD.  HTN: Doing well on 10mg  of lisinopril daily. No dizziness. No chest pain or SOB. Checking BP at home Avg 120-140/70. Some outliers of 150/90 to 160/90.  Those were taking before bed. Not sure if she was resting.   GERD: Doing well on protonix. No reflux symptoms. Did have one episode where she felt her throat was tightening-felt lump in throat.  Will see GI next week to follow on an esophageal stricture. No blood in stool or emesis. No regurgitation.  Hypothyroid: recently starting levothyroxine due to subclinical hypothyroid with TSH of 14. Taking medication daily. Doing well. No side effects. No cold intolerance, no constipations. No heat intolerance. No jitters.   Past Medical History  Diagnosis Date  . Asthma   . Heart murmur   . Obesity     Current Outpatient Prescriptions on File Prior to Visit  Medication Sig  . ezetimibe (ZETIA) 10 MG tablet Take 1 tablet (10 mg total) by mouth daily.  . fluticasone (FLONASE) 50 MCG/ACT nasal spray PLACE 2 SPRAYS INTO BOTH NOSTRILS DAILY.  Marland Kitchen levothyroxine (SYNTHROID, LEVOTHROID) 25 MCG tablet Take 1 tablet (25 mcg total) by mouth daily before breakfast.  . lisinopril (PRINIVIL,ZESTRIL) 10 MG tablet Take 1 tablet (10 mg total) by mouth daily.  . pantoprazole (PROTONIX) 40 MG tablet Take 1 tablet (40 mg total) by mouth daily.   No current facility-administered medications on file prior to visit.    Review of Systems  Constitutional: Negative for fever and chills.  HENT: Positive for trouble swallowing.   Respiratory: Negative for cough, chest tightness and wheezing.   Cardiovascular: Negative for chest pain and leg swelling.  Gastrointestinal: Negative for nausea, vomiting, abdominal pain, diarrhea  and constipation.  Endocrine: Negative.  Negative for cold intolerance, heat intolerance, polydipsia, polyphagia and polyuria.  Genitourinary: Negative for dysuria and difficulty urinating.  Musculoskeletal: Negative.   Neurological: Negative for dizziness, light-headedness and numbness.  Psychiatric/Behavioral: Negative.    Per HPI unless specifically indicated above     Objective:    BP 132/88 mmHg  Pulse 67  Temp(Src) 98.2 F (36.8 C) (Oral)  Resp 16  Ht 5\' 5"  (1.651 m)  Wt 187 lb (84.823 kg)  BMI 31.12 kg/m2  Wt Readings from Last 3 Encounters:  02/13/16 187 lb (84.823 kg)  01/09/16 186 lb (84.369 kg)  12/05/15 180 lb (81.647 kg)    Physical Exam  Constitutional: She is oriented to person, place, and time. She appears well-developed and well-nourished.  HENT:  Head: Normocephalic and atraumatic.  Neck: Neck supple.  Cardiovascular: Normal rate, regular rhythm and normal heart sounds.  Exam reveals no gallop and no friction rub.   No murmur heard. Pulmonary/Chest: Effort normal and breath sounds normal. She has no wheezes. She exhibits no tenderness.  Abdominal: Soft. Normal appearance and bowel sounds are normal. She exhibits no distension and no mass. There is no tenderness. There is no rebound and no guarding.  Musculoskeletal: Normal range of motion. She exhibits no edema or tenderness.  Lymphadenopathy:    She has no cervical adenopathy.  Neurological: She is alert and oriented to person, place, and time.  Skin: Skin is warm and dry.   Results for orders placed  or performed in visit on 01/09/16  CBC with Differential  Result Value Ref Range   WBC 6.6 3.4 - 10.8 x10E3/uL   RBC 4.34 3.77 - 5.28 x10E6/uL   Hemoglobin 12.8 11.1 - 15.9 g/dL   Hematocrit 40.0 34.0 - 46.6 %   MCV 92 79 - 97 fL   MCH 29.5 26.6 - 33.0 pg   MCHC 32.0 31.5 - 35.7 g/dL   RDW 14.1 12.3 - 15.4 %   Platelets 214 150 - 379 x10E3/uL   Neutrophils 52 %   Lymphs 36 %   Monocytes 7 %   Eos  4 %   Basos 1 %   Neutrophils Absolute 3.4 1.4 - 7.0 x10E3/uL   Lymphocytes Absolute 2.3 0.7 - 3.1 x10E3/uL   Monocytes Absolute 0.5 0.1 - 0.9 x10E3/uL   EOS (ABSOLUTE) 0.3 0.0 - 0.4 x10E3/uL   Basophils Absolute 0.1 0.0 - 0.2 x10E3/uL   Immature Granulocytes 0 %   Immature Grans (Abs) 0.0 0.0 - 0.1 x10E3/uL  Lipid Profile  Result Value Ref Range   Cholesterol, Total 270 (H) 100 - 199 mg/dL   Triglycerides 112 0 - 149 mg/dL   HDL 59 >39 mg/dL   VLDL Cholesterol Cal 22 5 - 40 mg/dL   LDL Calculated 189 (H) 0 - 99 mg/dL   Chol/HDL Ratio 4.6 (H) 0.0 - 4.4 ratio units  Comprehensive Metabolic Panel (CMET)  Result Value Ref Range   Glucose 91 65 - 99 mg/dL   BUN 12 8 - 27 mg/dL   Creatinine, Ser 0.93 0.57 - 1.00 mg/dL   GFR calc non Af Amer 66 >59 mL/min/1.73   GFR calc Af Amer 76 >59 mL/min/1.73   BUN/Creatinine Ratio 13 11 - 26   Sodium 148 (H) 134 - 144 mmol/L   Potassium 4.5 3.5 - 5.2 mmol/L   Chloride 106 96 - 106 mmol/L   CO2 22 18 - 29 mmol/L   Calcium 9.2 8.7 - 10.3 mg/dL   Total Protein 6.4 6.0 - 8.5 g/dL   Albumin 4.5 3.6 - 4.8 g/dL   Globulin, Total 1.9 1.5 - 4.5 g/dL   Albumin/Globulin Ratio 2.4 1.1 - 2.5   Bilirubin Total 0.5 0.0 - 1.2 mg/dL   Alkaline Phosphatase 95 39 - 117 IU/L   AST 18 0 - 40 IU/L   ALT 24 0 - 32 IU/L  TSH  Result Value Ref Range   TSH 14.280 (H) 0.450 - 4.500 uIU/mL  Thyroid Profile  Result Value Ref Range   T4, Total 5.7 4.5 - 12.0 ug/dL   T3 Uptake Ratio 27 24 - 39 %   Free Thyroxine Index 1.5 1.2 - 4.9  T3  Result Value Ref Range   T3, Total 86 71 - 180 ng/dL  Specimen status report  Result Value Ref Range   specimen status report Comment       Assessment & Plan:   Problem List Items Addressed This Visit      Cardiovascular and Mediastinum   Hypertension    Continue current dose of lisinopril. Pt will continue to monitor BP closely. Call if BP is averaging > 140/90. Reviewed how to take most accurate BP. Encouraged DASH diet  and lifestyle changes.  Recheck 3 mos.       Relevant Orders   Basic Metabolic Panel (BMET)     Digestive   GERD with stricture - Primary    Doing well on Protonix. Still  Sensation of lump in throat. Due  to esophageal stricture in the past, have her plan to follow to with GI next week for evaluation for need for EGD.  Alarm symptoms reviewed.  Recheck 3 mos.         Endocrine   Hypothyroidism    Recheck TSH in 2-4 weeks to determine dose titration.       Relevant Orders   TSH      No orders of the defined types were placed in this encounter.      Follow up plan: Return in about 3 months (around 05/15/2016) for HTN.

## 2016-02-13 NOTE — Assessment & Plan Note (Signed)
Continue current dose of lisinopril. Pt will continue to monitor BP closely. Call if BP is averaging > 140/90. Reviewed how to take most accurate BP. Encouraged DASH diet and lifestyle changes.  Recheck 3 mos.

## 2016-02-13 NOTE — Assessment & Plan Note (Signed)
Doing well on Protonix. Still  Sensation of lump in throat. Due to esophageal stricture in the past, have her plan to follow to with GI next week for evaluation for need for EGD.  Alarm symptoms reviewed.  Recheck 3 mos.

## 2016-02-22 ENCOUNTER — Encounter: Payer: Self-pay | Admitting: Gastroenterology

## 2016-02-22 ENCOUNTER — Other Ambulatory Visit: Payer: Self-pay

## 2016-02-22 ENCOUNTER — Ambulatory Visit (INDEPENDENT_AMBULATORY_CARE_PROVIDER_SITE_OTHER): Payer: 59 | Admitting: Gastroenterology

## 2016-02-22 VITALS — BP 142/79 | HR 75 | Temp 98.1°F | Ht 65.0 in | Wt 189.0 lb

## 2016-02-22 DIAGNOSIS — R1310 Dysphagia, unspecified: Secondary | ICD-10-CM | POA: Diagnosis not present

## 2016-02-22 NOTE — Progress Notes (Signed)
Gastroenterology Consultation  Referring Provider:     Katheren Fritz Primary Care Physician:  [facility] Primary Gastroenterologist:  Jillian Fritz     Reason for Consultation:     Dysphagia        HPI:   Jillian Fritz is a 65 y.o. y/o female referred for consultation & management of Dysphagia by [facility].  Patient comes today with a report of dysphagia. The patient reports that the symptoms are worse with solids than they are with liquids. The patient also reports that she has had this in the past with a dilation of her esophagus back in 2014 during the month of November by Dr. Gustavo Fritz. She denies any unexplained weight loss. The patient states that she is able to either vomit the food up or drink liquids to force it down when it happens and she has not been in need of a repeat endoscopy for food impaction since 2014. She states her symptoms have gotten worse when she runs out of her medication. The patient usually takes pantoprazole. She is now on pantoprazole and has been doing well. There is no report of any black stools or bloody stools and the patient reports that she is not due for colonoscopy at this time.  Past Medical History  Diagnosis Date  . Asthma   . Heart murmur   . Obesity     Past Surgical History  Procedure Laterality Date  . Cervical cone biopsy    . Throat surgery  04/2014    Prior to Admission medications   Medication Sig Start Date End Date Taking? Authorizing Provider  ezetimibe (ZETIA) 10 MG tablet Take 1 tablet (10 mg total) by mouth daily. 01/17/16  Yes Jillian Overton Mam, NP  fluticasone (FLONASE) 50 MCG/ACT nasal spray PLACE 2 SPRAYS INTO BOTH NOSTRILS DAILY. 12/05/15  Yes Historical Provider, MD  levothyroxine (SYNTHROID, LEVOTHROID) 25 MCG tablet Take 1 tablet (25 mcg total) by mouth daily before breakfast. 01/17/16  Yes Jillian Overton Mam, NP  lisinopril (PRINIVIL,ZESTRIL) 10 MG tablet Take 1 tablet (10 mg total) by  mouth daily. 01/09/16  Yes Jillian Overton Mam, NP  pantoprazole (PROTONIX) 40 MG tablet Take 1 tablet (40 mg total) by mouth daily. 01/09/16  Yes Jillian Overton Mam, NP    Family History  Problem Relation Age of Onset  . Alzheimer's disease Father   . Hypertension Mother      Social History  Substance Use Topics  . Smoking status: Never Smoker   . Smokeless tobacco: Never Used  . Alcohol Use: No    Allergies as of 02/22/2016 - Review Complete 02/22/2016  Allergen Reaction Noted  . Statins Swelling 01/17/2016  . Penicillins  12/05/2015  . Sulfa antibiotics  12/05/2015    Review of Systems:    All systems reviewed and negative except where noted in HPI.   Physical Exam:  BP 142/79 mmHg  Pulse 75  Temp(Src) 98.1 F (36.7 C) (Oral)  Ht 5\' 5"  (1.651 m)  Wt 189 lb (85.73 kg)  BMI 31.45 kg/m2 No LMP recorded. Patient is postmenopausal. Psych:  Alert and cooperative. Normal mood and affect. General:   Alert,  Well-developed, well-nourished, pleasant and cooperative in NAD Head:  Normocephalic and atraumatic. Eyes:  Sclera clear, no icterus.   Conjunctiva pink. Ears:  Normal auditory acuity. Nose:  No deformity, discharge, or lesions. Mouth:  No deformity or lesions,oropharynx pink & moist. Neck:  Supple; no masses or thyromegaly. Lungs:  Respirations even and unlabored.  Clear throughout to auscultation.   No wheezes, crackles, or rhonchi. No acute distress. Heart:  Regular rate and rhythm; no murmurs, clicks, rubs, or gallops. Abdomen:  Normal bowel sounds.  No bruits.  Soft, non-tender and non-distended without masses, hepatosplenomegaly or hernias noted.  No guarding or rebound tenderness.  Negative Carnett sign.   Rectal:  Deferred.  Msk:  Symmetrical without gross deformities.  Good, equal movement & strength bilaterally. Pulses:  Normal pulses noted. Extremities:  No clubbing or edema.  No cyanosis. Neurologic:  Alert and oriented x3;  grossly normal neurologically. Skin:   Intact without significant lesions or rashes.  No jaundice. Lymph Nodes:  No significant cervical adenopathy. Psych:  Alert and cooperative. Normal mood and affect.  Imaging Studies: No results found.  Assessment and Plan:   [name] is a 65 y.o. y/o female who comes in with dysphagia and a history of esophageal impaction with food back in 2014. The patient was off of her medication which was pantoprazole and her symptoms increase. The patient is now back on her medication and states that she has been doing well. It up for an EGD with possible esophageal dilation.I have discussed risks & benefits which include, but are not limited to, bleeding, infection, perforation & drug reaction.  The patient agrees with this plan & written consent will be obtained.      Note: This dictation was prepared with Dragon dictation along with smaller phrase technology. Any transcriptional errors that result from this process are unintentional.

## 2016-02-25 LAB — BASIC METABOLIC PANEL
BUN / CREAT RATIO: 19 (ref 11–26)
BUN: 14 mg/dL (ref 8–27)
CALCIUM: 9.2 mg/dL (ref 8.7–10.3)
CHLORIDE: 104 mmol/L (ref 96–106)
CO2: 26 mmol/L (ref 18–29)
Creatinine, Ser: 0.75 mg/dL (ref 0.57–1.00)
GFR, EST AFRICAN AMERICAN: 98 mL/min/{1.73_m2} (ref >59)
GFR, EST NON AFRICAN AMERICAN: 85 mL/min/{1.73_m2} (ref >59)
GLUCOSE: 86 mg/dL (ref 65–99)
POTASSIUM: 4 mmol/L (ref 3.5–5.2)
SODIUM: 143 mmol/L (ref 134–144)

## 2016-02-25 LAB — TSH: TSH: 8.43 u[IU]/mL — ABNORMAL HIGH (ref 0.450–4.500)

## 2016-02-27 ENCOUNTER — Other Ambulatory Visit: Payer: Self-pay | Admitting: Family Medicine

## 2016-02-27 MED ORDER — LEVOTHYROXINE SODIUM 50 MCG PO TABS: 50.0000 ug | ORAL_TABLET | Freq: Every day | ORAL | Status: DC

## 2016-03-21 NOTE — Discharge Instructions (Signed)

## 2016-03-23 ENCOUNTER — Ambulatory Visit: Payer: 59 | Admitting: Anesthesiology

## 2016-03-23 ENCOUNTER — Encounter
Admission: RE | Disposition: A | Discharge status: Home / Self Care | Payer: Self-pay | Source: Ambulatory Visit | Attending: Gastroenterology

## 2016-03-23 ENCOUNTER — Ambulatory Visit
Admission: RE | Admit: 2016-03-23 | Discharge: 2016-03-23 | Disposition: A | Discharge status: Home / Self Care | Payer: 59 | Source: Ambulatory Visit | Attending: Gastroenterology | Admitting: Gastroenterology

## 2016-03-23 DIAGNOSIS — E039 Hypothyroidism, unspecified: Secondary | ICD-10-CM | POA: Diagnosis not present

## 2016-03-23 DIAGNOSIS — R0600 Dyspnea, unspecified: Secondary | ICD-10-CM | POA: Insufficient documentation

## 2016-03-23 DIAGNOSIS — R0602 Shortness of breath: Secondary | ICD-10-CM | POA: Diagnosis not present

## 2016-03-23 DIAGNOSIS — Z880 Allergy status to penicillin: Secondary | ICD-10-CM | POA: Diagnosis not present

## 2016-03-23 DIAGNOSIS — J45909 Unspecified asthma, uncomplicated: Secondary | ICD-10-CM | POA: Diagnosis not present

## 2016-03-23 DIAGNOSIS — R1310 Dysphagia, unspecified: Secondary | ICD-10-CM | POA: Diagnosis present

## 2016-03-23 DIAGNOSIS — Z820 Family history of epilepsy and other diseases of the nervous system: Secondary | ICD-10-CM | POA: Diagnosis not present

## 2016-03-23 DIAGNOSIS — Z79899 Other long term (current) drug therapy: Secondary | ICD-10-CM | POA: Insufficient documentation

## 2016-03-23 DIAGNOSIS — I10 Essential (primary) hypertension: Secondary | ICD-10-CM | POA: Diagnosis not present

## 2016-03-23 DIAGNOSIS — K222 Esophageal obstruction: Secondary | ICD-10-CM | POA: Diagnosis not present

## 2016-03-23 DIAGNOSIS — K219 Gastro-esophageal reflux disease without esophagitis: Secondary | ICD-10-CM | POA: Insufficient documentation

## 2016-03-23 DIAGNOSIS — R011 Cardiac murmur, unspecified: Secondary | ICD-10-CM | POA: Diagnosis not present

## 2016-03-23 DIAGNOSIS — Z8249 Family history of ischemic heart disease and other diseases of the circulatory system: Secondary | ICD-10-CM | POA: Insufficient documentation

## 2016-03-23 DIAGNOSIS — Z888 Allergy status to other drugs, medicaments and biological substances status: Secondary | ICD-10-CM | POA: Diagnosis not present

## 2016-03-23 DIAGNOSIS — Z6830 Body mass index (BMI) 30.0-30.9, adult: Secondary | ICD-10-CM | POA: Diagnosis not present

## 2016-03-23 DIAGNOSIS — E669 Obesity, unspecified: Secondary | ICD-10-CM | POA: Diagnosis not present

## 2016-03-23 DIAGNOSIS — Z882 Allergy status to sulfonamides status: Secondary | ICD-10-CM | POA: Diagnosis not present

## 2016-03-23 DIAGNOSIS — G43909 Migraine, unspecified, not intractable, without status migrainosus: Secondary | ICD-10-CM | POA: Diagnosis not present

## 2016-03-23 HISTORY — DX: Reserved for inherently not codable concepts without codable children: IMO0001

## 2016-03-23 HISTORY — DX: Gastro-esophageal reflux disease without esophagitis: K21.9

## 2016-03-23 HISTORY — DX: Essential (primary) hypertension: I10

## 2016-03-23 HISTORY — DX: Migraine, unspecified, not intractable, without status migrainosus: G43.909

## 2016-03-23 HISTORY — PX: ESOPHAGOGASTRODUODENOSCOPY (EGD) WITH PROPOFOL: SHX5813

## 2016-03-23 HISTORY — DX: Hypothyroidism, unspecified: E03.9

## 2016-03-23 SURGERY — ESOPHAGOGASTRODUODENOSCOPY (EGD) WITH PROPOFOL
Anesthesia: Monitor Anesthesia Care | Wound class: Clean Contaminated

## 2016-03-23 MED ORDER — PROPOFOL 10 MG/ML IV BOLUS
INTRAVENOUS | Status: DC | PRN
  Administered 2016-03-23: 40 mg via INTRAVENOUS
  Administered 2016-03-23 (×2): 30 mg via INTRAVENOUS
  Administered 2016-03-23: 100 mg via INTRAVENOUS
  Administered 2016-03-23: 30 mg via INTRAVENOUS

## 2016-03-23 MED ORDER — LIDOCAINE HCL (CARDIAC) 20 MG/ML IV SOLN
INTRAVENOUS | Status: DC | PRN
  Administered 2016-03-23: 20 mg via INTRAVENOUS

## 2016-03-23 MED ORDER — LACTATED RINGERS IV SOLN
INTRAVENOUS | Status: DC
  Administered 2016-03-23 (×2): via INTRAVENOUS

## 2016-03-23 MED ORDER — GLYCOPYRROLATE 0.2 MG/ML IJ SOLN
INTRAMUSCULAR | Status: DC | PRN
  Administered 2016-03-23: 0.1 mg via INTRAVENOUS

## 2016-03-23 SURGICAL SUPPLY — 31 items
BALLN DILATOR 10-12 8 (BALLOONS)
BALLN DILATOR 12-15 8 (BALLOONS)
BALLN DILATOR 15-18 8 (BALLOONS)
BALLN DILATOR CRE 0-12 8 (BALLOONS)
BALLN DILATOR ESOPH 8 10 CRE (MISCELLANEOUS) IMPLANT
BALLOON DILATOR 12-15 8 (BALLOONS) IMPLANT
BALLOON DILATOR 15-18 8 (BALLOONS) IMPLANT
BALLOON DILATOR CRE 0-12 8 (BALLOONS) IMPLANT
BLOCK BITE 60FR ADLT L/F GRN (MISCELLANEOUS) ×2 IMPLANT
CANISTER SUCT 1200ML W/VALVE (MISCELLANEOUS) ×2 IMPLANT
CLIP HMST 235XBRD CATH ROT (MISCELLANEOUS) IMPLANT
CLIP RESOLUTION 360 11X235 (MISCELLANEOUS)
FCP ESCP3.2XJMB 240X2.8X (MISCELLANEOUS)
FORCEPS BIOP RAD 4 LRG CAP 4 (CUTTING FORCEPS) IMPLANT
FORCEPS BIOP RJ4 240 W/NDL (MISCELLANEOUS)
FORCEPS ESCP3.2XJMB 240X2.8X (MISCELLANEOUS) IMPLANT
GOWN CVR UNV OPN BCK APRN NK (MISCELLANEOUS) ×2 IMPLANT
GOWN ISOL THUMB LOOP REG UNIV (MISCELLANEOUS) ×2
INJECTOR VARIJECT VIN23 (MISCELLANEOUS) IMPLANT
KIT DEFENDO VALVE AND CONN (KITS) IMPLANT
KIT ENDO PROCEDURE OLY (KITS) ×2 IMPLANT
MARKER SPOT ENDO TATTOO 5ML (MISCELLANEOUS) IMPLANT
PAD GROUND ADULT SPLIT (MISCELLANEOUS) IMPLANT
SNARE SHORT THROW 13M SML OVAL (MISCELLANEOUS) IMPLANT
SNARE SHORT THROW 30M LRG OVAL (MISCELLANEOUS) IMPLANT
SPOT EX ENDOSCOPIC TATTOO (MISCELLANEOUS)
SYR INFLATION 60ML (SYRINGE) IMPLANT
TRAP ETRAP POLY (MISCELLANEOUS) IMPLANT
VARIJECT INJECTOR VIN23 (MISCELLANEOUS)
WATER STERILE IRR 250ML POUR (IV SOLUTION) ×2 IMPLANT
WIRE CRE 18-20MM 8CM F G (MISCELLANEOUS) IMPLANT

## 2016-03-23 NOTE — H&P (Signed)
  [facility]  [address] [place], [place] [address] Phone: [phone] Fax : [phone]  Primary Care Physician:  [facility] Primary Gastroenterologist:  Dr. Allen Norris  Pre-Procedure History & Physical: HPI:  Jillian Fritz is a 65 y.o. female is here for an endoscopy.   Past Medical History  Diagnosis Date  . Asthma   . Heart murmur   . Obesity   . Hypertension   . Heart murmur     states she has 2 murmurs  . Shortness of breath dyspnea   . Migraines   . Hypothyroidism   . GERD (gastroesophageal reflux disease)     Past Surgical History  Procedure Laterality Date  . Cervical cone biopsy    . Throat surgery  04/2014    Prior to Admission medications   Medication Sig Start Date End Date Taking? Authorizing Provider  ezetimibe (ZETIA) 10 MG tablet Take 1 tablet (10 mg total) by mouth daily. 01/17/16  Yes Amy Overton Mam, NP  fluticasone (FLONASE) 50 MCG/ACT nasal spray PLACE 2 SPRAYS INTO BOTH NOSTRILS DAILY. 12/05/15  Yes Historical Provider, MD  levothyroxine (SYNTHROID, LEVOTHROID) 50 MCG tablet Take 1 tablet (50 mcg total) by mouth daily. 02/27/16  Yes Amy Overton Mam, NP  lisinopril (PRINIVIL,ZESTRIL) 10 MG tablet Take 1 tablet (10 mg total) by mouth daily. 01/09/16  Yes Amy Overton Mam, NP  pantoprazole (PROTONIX) 40 MG tablet Take 1 tablet (40 mg total) by mouth daily. 01/09/16  Yes Amy Overton Mam, NP    Allergies as of 02/22/2016 - Review Complete 02/22/2016  Allergen Reaction Noted  . Statins Swelling 01/17/2016  . Penicillins  12/05/2015  . Sulfa antibiotics  12/05/2015    Family History  Problem Relation Age of Onset  . Alzheimer's disease Father   . Hypertension Mother     Social History   Social History  . Marital Status: Single    Spouse Name: N/A  . Number of Children: N/A  . Years of Education: N/A   Occupational History  . Not on file.   Social History Main Topics  . Smoking status: Never Smoker   .  Smokeless tobacco: Never Used  . Alcohol Use: No  . Drug Use: No  . Sexual Activity: Not on file   Other Topics Concern  . Not on file   Social History Narrative    Review of Systems: See HPI, otherwise negative ROS  Physical Exam: BP 148/83 mmHg  Pulse 73  Temp(Src) 97 F (36.1 C) (Temporal)  Ht 5\' 6"  (1.676 m)  Wt 186 lb (84.369 kg)  BMI 30.04 kg/m2  SpO2 100% General:   Alert,  pleasant and cooperative in NAD Head:  Normocephalic and atraumatic. Neck:  Supple; no masses or thyromegaly. Lungs:  Clear throughout to auscultation.    Heart:  Regular rate and rhythm. Abdomen:  Soft, nontender and nondistended. Normal bowel sounds, without guarding, and without rebound.   Neurologic:  Alert and  oriented x4;  grossly normal neurologically.  Impression/Plan: Jillian Fritz is here for an endoscopy to be performed for dysphagia  Risks, benefits, limitations, and alternatives regarding  endoscopy have been reviewed with the patient.  Questions have been answered.  All parties agreeable.   Ollen Bowl, MD  03/23/2016, 9:32 AM

## 2016-03-23 NOTE — Transfer of Care (Signed)
Immediate Anesthesia Transfer of Care Note  Patient: Jillian Fritz  Procedure(s) Performed: Procedure(s): ESOPHAGOGASTRODUODENOSCOPY (EGD) WITH esophageal dilation.  (N/A)  Patient Location: PACU  Anesthesia Type: MAC  Level of Consciousness: awake, alert  and patient cooperative  Airway and Oxygen Therapy: Patient Spontanous Breathing and Patient connected to supplemental oxygen  Post-op Assessment: Post-op Vital signs reviewed, Patient's Cardiovascular Status Stable, Respiratory Function Stable, Patent Airway and No signs of Nausea or vomiting  Post-op Vital Signs: Reviewed and stable  Complications: No apparent anesthesia complications

## 2016-03-23 NOTE — Anesthesia Procedure Notes (Signed)
Procedure Name: MAC Performed by: Tachina Spoonemore Pre-anesthesia Checklist: Patient identified, Emergency Drugs available, Suction available, Patient being monitored and Timeout performed Patient Re-evaluated:Patient Re-evaluated prior to inductionOxygen Delivery Method: Nasal cannula       

## 2016-03-23 NOTE — Op Note (Signed)
[facility] Patient Name: Jillian Fritz Procedure Date: 03/23/2016 10:13 AM MRN: [id number] Account #: [id number] Date of Birth: 04-Aug-1951 Admit Type: Outpatient Age: 65 Room: [facility] OR ROOM 01 Gender: Female Note Status: Finalized Procedure:            Upper GI endoscopy Indications:          Dysphagia Providers:            [name], MD Referring MD:         Arlis Porta, MD (Referring MD) Medicines:            Propofol per Anesthesia Complications:        No immediate complications. Procedure:            Pre-Anesthesia Assessment:                       - Prior to the procedure, a History and Physical was                        performed, and patient medications and allergies were                        reviewed. The patient's tolerance of previous                        anesthesia was also reviewed. The risks and benefits of                        the procedure and the sedation options and risks were                        discussed with the patient. All questions were                        answered, and informed consent was obtained. Prior                        Anticoagulants: The patient has taken no previous                        anticoagulant or antiplatelet agents. ASA Grade                        Assessment: II - A patient with mild systemic disease.                        After reviewing the risks and benefits, the patient was                        deemed in satisfactory condition to undergo the                        procedure.                       After obtaining informed consent, the endoscope was                        passed under direct vision. Throughout the procedure,  the patient's blood pressure, pulse, and oxygen                        saturations were monitored continuously. The was                        introduced through the mouth, and advanced to the                        second part  of duodenum. The upper GI endoscopy was                        accomplished without difficulty. The patient tolerated                        the procedure well. Findings:      One mild benign-appearing, intrinsic stenosis was found at the       gastroesophageal junction. And was traversed. The scope was withdrawn.       Dilation was performed with a Maloney dilator with no resistance at 86       Fr. The dilation site was examined following endoscope reinsertion and       showed complete resolution of luminal narrowing.      The stomach was normal.      The examined duodenum was normal. Impression:           - Benign-appearing esophageal stenosis. Dilated.                       - Normal stomach.                       - Normal examined duodenum.                       - No specimens collected. Recommendation:       - Repeat upper endoscopy PRN for retreatment. Procedure Code(s):    --- Professional ---                       [phone], Esophagogastroduodenoscopy, flexible, transoral;                        diagnostic, including collection of specimen(s) by                        brushing or washing, when performed (separate procedure)                       43450, Dilation of esophagus, by unguided sound or                        bougie, single or multiple passes Diagnosis Code(s):    --- Professional ---                       R13.10, Dysphagia, unspecified                       K22.2, Esophageal obstruction CPT copyright 2016 American Medical Association. All rights reserved. The codes documented in this report are preliminary and upon coder review may  be revised to meet current compliance requirements. [name], MD 03/23/2016  10:34:55 AM This report has been signed electronically. Number of Addenda: 0 Note Initiated On: 03/23/2016 10:13 AM      [facility]

## 2016-03-23 NOTE — Anesthesia Postprocedure Evaluation (Signed)
Anesthesia Post Note  Patient: Jillian Fritz  Procedure(s) Performed: Procedure(s) (LRB): ESOPHAGOGASTRODUODENOSCOPY (EGD) WITH esophageal dilation.  (N/A)  Patient location during evaluation: PACU Anesthesia Type: MAC Level of consciousness: awake and alert and oriented Pain management: satisfactory to patient Vital Signs Assessment: post-procedure vital signs reviewed and stable Respiratory status: spontaneous breathing, nonlabored ventilation and respiratory function stable Cardiovascular status: blood pressure returned to baseline and stable Postop Assessment: Adequate PO intake and No signs of nausea or vomiting Anesthetic complications: no    Raliegh Ip

## 2016-03-23 NOTE — Anesthesia Preprocedure Evaluation (Signed)
Anesthesia Evaluation  Patient identified by MRN, date of birth, ID band  Reviewed: Allergy & Precautions, H&P , NPO status , Patient's Chart, lab work & pertinent test results  Airway Mallampati: II  TM Distance: >3 FB Neck ROM: full    Dental no notable dental hx.    Pulmonary asthma ,    Pulmonary exam normal        Cardiovascular hypertension,  Rhythm:regular Rate:Normal     Neuro/Psych    GI/Hepatic GERD  ,  Endo/Other  Hypothyroidism   Renal/GU      Musculoskeletal   Abdominal   Peds  Hematology   Anesthesia Other Findings   Reproductive/Obstetrics                             Anesthesia Physical Anesthesia Plan  ASA: II  Anesthesia Plan: MAC   Post-op Pain Management:    Induction:   Airway Management Planned:   Additional Equipment:   Intra-op Plan:   Post-operative Plan:   Informed Consent: I have reviewed the patients History and Physical, chart, labs and discussed the procedure including the risks, benefits and alternatives for the proposed anesthesia with the patient or authorized representative who has indicated his/her understanding and acceptance.     Plan Discussed with: CRNA  Anesthesia Plan Comments:         Anesthesia Quick Evaluation

## 2016-03-27 ENCOUNTER — Encounter: Payer: Self-pay | Admitting: Gastroenterology

## 2016-03-27 ENCOUNTER — Other Ambulatory Visit: Payer: Self-pay | Admitting: Family Medicine

## 2016-03-27 DIAGNOSIS — E785 Hyperlipidemia, unspecified: Secondary | ICD-10-CM

## 2016-03-27 DIAGNOSIS — K222 Esophageal obstruction: Principal | ICD-10-CM

## 2016-03-27 DIAGNOSIS — K219 Gastro-esophageal reflux disease without esophagitis: Secondary | ICD-10-CM

## 2016-03-28 NOTE — Telephone Encounter (Signed)
Medication refill sent to [facility] for refill.[place]

## 2016-04-11 ENCOUNTER — Other Ambulatory Visit: Payer: Self-pay | Admitting: Family Medicine

## 2016-04-11 DIAGNOSIS — K219 Gastro-esophageal reflux disease without esophagitis: Secondary | ICD-10-CM

## 2016-04-11 DIAGNOSIS — K222 Esophageal obstruction: Principal | ICD-10-CM

## 2016-04-11 MED ORDER — PANTOPRAZOLE SODIUM 40 MG PO TBEC: 40.0000 mg | DELAYED_RELEASE_TABLET | Freq: Every day | ORAL | Status: DC

## 2016-05-06 ENCOUNTER — Other Ambulatory Visit: Payer: Self-pay | Admitting: Family Medicine

## 2016-05-22 ENCOUNTER — Ambulatory Visit (INDEPENDENT_AMBULATORY_CARE_PROVIDER_SITE_OTHER): Payer: 59 | Admitting: Family Medicine

## 2016-05-22 ENCOUNTER — Encounter: Payer: Self-pay | Admitting: Family Medicine

## 2016-05-22 VITALS — BP 136/82 | HR 72 | Temp 97.9°F | Resp 16 | Ht 65.0 in | Wt 192.0 lb

## 2016-05-22 DIAGNOSIS — E785 Hyperlipidemia, unspecified: Secondary | ICD-10-CM

## 2016-05-22 DIAGNOSIS — E034 Atrophy of thyroid (acquired): Secondary | ICD-10-CM | POA: Diagnosis not present

## 2016-05-22 DIAGNOSIS — I10 Essential (primary) hypertension: Secondary | ICD-10-CM | POA: Diagnosis not present

## 2016-05-22 DIAGNOSIS — E038 Other specified hypothyroidism: Secondary | ICD-10-CM | POA: Diagnosis not present

## 2016-05-22 DIAGNOSIS — K219 Gastro-esophageal reflux disease without esophagitis: Secondary | ICD-10-CM | POA: Diagnosis not present

## 2016-05-22 DIAGNOSIS — K222 Esophageal obstruction: Secondary | ICD-10-CM | POA: Diagnosis not present

## 2016-05-22 DIAGNOSIS — M25471 Effusion, right ankle: Secondary | ICD-10-CM | POA: Diagnosis not present

## 2016-05-22 MED ORDER — LOSARTAN POTASSIUM 25 MG PO TABS: 25.0000 mg | ORAL_TABLET | Freq: Every day | ORAL | Status: DC

## 2016-05-22 NOTE — Patient Instructions (Signed)
Your goal blood pressure is 140/90 Work on low salt/sodium diet - goal <1.5gm (1,500mg ) per day. Eat a diet high in fruits/vegetables and whole grains.  Look into mediterranean and DASH diet. Goal activity is 157min/wk of moderate intensity exercise.  This can be split into 30 minute chunks.  If you are not at this level, you can start with smaller 10-15 min increments and slowly build up activity. Look at [place] for more resources  We will switch to losartan to hopefully stop your cough.  We will check your thyroid to determine if that is the cause of your ankle swelling.

## 2016-05-22 NOTE — Assessment & Plan Note (Signed)
Controlled but pt having ACE cough. Will change to ARB due to cough. Recheck 1 mos.

## 2016-05-22 NOTE — Assessment & Plan Note (Signed)
Recheck lipid panel to determine if zetia is controlling LDL.

## 2016-05-22 NOTE — Assessment & Plan Note (Signed)
Still some swelling, weight, and low energy. Check TSH today to determine if dosing needs titration.

## 2016-05-22 NOTE — Progress Notes (Signed)
Subjective:    Patient ID: Jillian Fritz, female    DOB: [birth year], 65 y.o.   MRN: [id number]  HPI: Jillian Fritz is a 65 y.o. female presenting on 05/22/2016 for Hypertension   HPI  Pt presents for hypertension and hypothyroid follow-up. Has noticed more energy with the synthroid. BP is well controlled at home. No chest pain. No shortness of breath. No syncope.  Saw Dr. Allen Norris- had EGD. GERD symptoms doing well. No dysphagia. No regurgitation.  Still coughing a little. Dry cough daily.  Ankle swelling- bilateral ankles. R>L. No pain. No redness. Some nights her legs cramp badly. Had a more pronounced reaction with statins.   Past Medical History  Diagnosis Date  . Asthma   . Heart murmur   . Obesity   . Hypertension   . Heart murmur     states she has 2 murmurs  . Shortness of breath dyspnea   . Migraines   . Hypothyroidism   . GERD (gastroesophageal reflux disease)     Current Outpatient Prescriptions on File Prior to Visit  Medication Sig  . ezetimibe (ZETIA) 10 MG tablet Take 1 tablet (10 mg total) by mouth daily.  . fluticasone (FLONASE) 50 MCG/ACT nasal spray PLACE 2 SPRAYS INTO BOTH NOSTRILS DAILY.  Marland Kitchen levothyroxine (SYNTHROID, LEVOTHROID) 50 MCG tablet Take 1 tablet (50 mcg total) by mouth daily.  . pantoprazole (PROTONIX) 40 MG tablet Take 1 tablet (40 mg total) by mouth daily.   No current facility-administered medications on file prior to visit.    Review of Systems  Constitutional: Positive for fatigue. Negative for fever and chills.  HENT: Negative.   Respiratory: Negative for cough, chest tightness and wheezing.   Cardiovascular: Negative for chest pain and leg swelling.  Gastrointestinal: Negative for nausea, vomiting, abdominal pain, diarrhea and constipation.  Endocrine: Negative.  Negative for cold intolerance, heat intolerance, polydipsia, polyphagia and polyuria.  Genitourinary: Negative for dysuria and difficulty urinating.  Musculoskeletal:  Positive for joint swelling (bilateral ankles.).  Neurological: Negative for dizziness, light-headedness and numbness.  Psychiatric/Behavioral: Negative.    Per HPI unless specifically indicated above     Objective:    BP 136/82 mmHg  Pulse 72  Temp(Src) 97.9 F (36.6 C) (Oral)  Resp 16  Ht 5\' 5"  (1.651 m)  Wt 192 lb (87.091 kg)  BMI 31.95 kg/m2  Wt Readings from Last 3 Encounters:  05/22/16 192 lb (87.091 kg)  03/23/16 186 lb (84.369 kg)  02/22/16 189 lb (85.73 kg)    Physical Exam  Constitutional: She is oriented to person, place, and time. She appears well-developed and well-nourished.  HENT:  Head: Normocephalic and atraumatic.  Neck: Neck supple.  Cardiovascular: Normal rate, regular rhythm and normal heart sounds.  Exam reveals no gallop and no friction rub.   No murmur heard. Pulmonary/Chest: Effort normal and breath sounds normal. She has no wheezes. She exhibits no tenderness.  Abdominal: Soft. Normal appearance and bowel sounds are normal. She exhibits no distension and no mass. There is no tenderness. There is no rebound and no guarding.  Musculoskeletal: Normal range of motion. She exhibits no edema or tenderness.       Right ankle: She exhibits swelling. She exhibits normal range of motion, no deformity and normal pulse.       Left ankle: She exhibits swelling. She exhibits normal range of motion, no deformity and normal pulse.  Lymphadenopathy:    She has no cervical adenopathy.  Neurological: She is alert and  oriented to person, place, and time.  Skin: Skin is warm and dry.   Results for orders placed or performed in visit on 02/13/16  TSH  Result Value Ref Range   TSH 8.430 (H) 0.450 - 4.500 uIU/mL  Basic Metabolic Panel (BMET)  Result Value Ref Range   Glucose 86 65 - 99 mg/dL   BUN 14 8 - 27 mg/dL   Creatinine, Ser 0.75 0.57 - 1.00 mg/dL   GFR calc non Af Amer 85 >59 mL/min/1.73   GFR calc Af Amer 98 >59 mL/min/1.73   BUN/Creatinine Ratio 19 11 - 26    Sodium 143 134 - 144 mmol/L   Potassium 4.0 3.5 - 5.2 mmol/L   Chloride 104 96 - 106 mmol/L   CO2 26 18 - 29 mmol/L   Calcium 9.2 8.7 - 10.3 mg/dL      Assessment & Plan:   Problem List Items Addressed This Visit      Cardiovascular and Mediastinum   Hypertension - Primary    Controlled but pt having ACE cough. Will change to ARB due to cough. Recheck 1 mos.       Relevant Medications   losartan (COZAAR) 25 MG tablet   Other Relevant Orders   BASIC METABOLIC PANEL WITH GFR     Digestive   GERD with stricture    Continue protonix daily to help with symptoms. Alarm symptoms reviewed. Recheck 6 mos.         Endocrine   Hypothyroidism    Still some swelling, weight, and low energy. Check TSH today to determine if dosing needs titration.       Relevant Orders   TSH     Other   Hyperlipidemia    Recheck lipid panel to determine if zetia is controlling LDL.       Relevant Medications   losartan (COZAAR) 25 MG tablet   Other Relevant Orders   Lipid panel    Other Visit Diagnoses    Ankle swelling, right        Thyroid related vs. post-trauma. Check TSH today. Encouraged pt to elevated ankles daily at work. Encourage compression stockings.        Meds ordered this encounter  Medications  . losartan (COZAAR) 25 MG tablet    Sig: Take 1 tablet (25 mg total) by mouth daily.    Dispense:  30 tablet    Refill:  11    Order Specific Question:  Supervising Provider    Answer:  Arlis Porta [phone]      Follow up plan: Return in about 1 month (around 06/21/2016) for BP check. Marland Kitchen

## 2016-05-22 NOTE — Assessment & Plan Note (Signed)
Continue protonix daily to help with symptoms. Alarm symptoms reviewed. Recheck 6 mos.

## 2016-05-23 ENCOUNTER — Telehealth: Payer: Self-pay | Admitting: *Deleted

## 2016-05-23 LAB — BASIC METABOLIC PANEL WITH GFR
BUN: 13 mg/dL (ref 7–25)
CALCIUM: 8.4 mg/dL — AB (ref 8.6–10.4)
CO2: 21 mmol/L (ref 20–31)
Chloride: 108 mmol/L (ref 98–110)
Creat: 0.87 mg/dL (ref 0.50–0.99)
GFR, EST NON AFRICAN AMERICAN: 71 mL/min (ref >=60)
GFR, Est African American: 81 mL/min (ref >=60)
GLUCOSE: 94 mg/dL (ref 65–99)
POTASSIUM: 4.3 mmol/L (ref 3.5–5.3)
Sodium: 141 mmol/L (ref 135–146)

## 2016-05-23 LAB — TSH: TSH: 4.96 mIU/L — ABNORMAL HIGH

## 2016-05-23 LAB — LIPID PANEL
CHOL/HDL RATIO: 3.3 ratio (ref <=5.0)
Cholesterol: 207 mg/dL — ABNORMAL HIGH (ref 125–200)
HDL: 63 mg/dL (ref >=46)
LDL CALC: 125 mg/dL (ref <130)
Triglycerides: 97 mg/dL (ref <150)
VLDL: 19 mg/dL (ref <30)

## 2016-05-23 MED ORDER — LEVOTHYROXINE SODIUM 75 MCG PO TABS: 75.0000 ug | ORAL_TABLET | Freq: Every day | ORAL | Status: DC

## 2016-05-23 NOTE — Telephone Encounter (Signed)
New thyroid medication sent to pharmacy.[place]

## 2016-05-23 NOTE — Addendum Note (Signed)
Addended by: Devona Konig on: 05/23/2016 09:42 AM   Modules accepted: Orders

## 2016-06-26 ENCOUNTER — Ambulatory Visit: Payer: 59 | Admitting: Family Medicine

## 2016-06-26 ENCOUNTER — Ambulatory Visit (INDEPENDENT_AMBULATORY_CARE_PROVIDER_SITE_OTHER): Payer: 59 | Admitting: Family Medicine

## 2016-06-26 VITALS — BP 134/72 | HR 72 | Temp 98.0°F | Resp 16 | Ht 65.0 in | Wt 196.0 lb

## 2016-06-26 DIAGNOSIS — I10 Essential (primary) hypertension: Secondary | ICD-10-CM

## 2016-06-26 DIAGNOSIS — E034 Atrophy of thyroid (acquired): Secondary | ICD-10-CM

## 2016-06-26 DIAGNOSIS — E038 Other specified hypothyroidism: Secondary | ICD-10-CM

## 2016-06-26 NOTE — Assessment & Plan Note (Signed)
Controlled. Continue Losartan 25mg  once daily. Recheck 2 mos.

## 2016-06-26 NOTE — Progress Notes (Signed)
Subjective:    Patient ID: Jillian Fritz, female    DOB: [birth year], 65 y.o.   MRN: [id number]  HPI: Jillian Fritz is a 65 y.o. female presenting on 06/26/2016 for Hypertension (pt's B/P monitor 139/70)   HPI  Pt presents for follow-up of BP. Swelling the ankles has resolved. Cough has resolved with switch to losartan. BP seems controlled. No CP, dizziness, SOB.  Ankle swelling resolved with increased dose of levothyroxine.   Past Medical History:  Diagnosis Date  . Asthma   . GERD (gastroesophageal reflux disease)   . Heart murmur   . Heart murmur    states she has 2 murmurs  . Hypertension   . Hypothyroidism   . Migraines   . Obesity   . Shortness of breath dyspnea     Current Outpatient Prescriptions on File Prior to Visit  Medication Sig  . ezetimibe (ZETIA) 10 MG tablet Take 1 tablet (10 mg total) by mouth daily.  . fluticasone (FLONASE) 50 MCG/ACT nasal spray PLACE 2 SPRAYS INTO BOTH NOSTRILS DAILY.  Marland Kitchen levothyroxine (SYNTHROID, LEVOTHROID) 75 MCG tablet Take 1 tablet (75 mcg total) by mouth daily.  Marland Kitchen losartan (COZAAR) 25 MG tablet Take 1 tablet (25 mg total) by mouth daily.  . pantoprazole (PROTONIX) 40 MG tablet Take 1 tablet (40 mg total) by mouth daily.   No current facility-administered medications on file prior to visit.     Review of Systems  Constitutional: Negative for chills and fever.  HENT: Negative.   Respiratory: Negative for cough, chest tightness and wheezing.   Cardiovascular: Negative for chest pain and leg swelling.  Gastrointestinal: Negative for abdominal pain, constipation, diarrhea, nausea and vomiting.  Endocrine: Negative.  Negative for cold intolerance, heat intolerance, polydipsia, polyphagia and polyuria.  Genitourinary: Negative for difficulty urinating and dysuria.  Musculoskeletal: Negative.   Neurological: Negative for dizziness, light-headedness and numbness.  Psychiatric/Behavioral: Negative.    Per HPI unless  specifically indicated above     Objective:    BP 134/72 (BP Location: Right Arm, Patient Position: Sitting, Cuff Size: Normal)   Pulse 72   Temp 98 F (36.7 C) (Oral)   Resp 16   Ht 5\' 5"  (1.651 m)   Wt 196 lb (88.9 kg)   BMI 32.62 kg/m   Wt Readings from Last 3 Encounters:  06/26/16 196 lb (88.9 kg)  05/22/16 192 lb (87.1 kg)  03/23/16 186 lb (84.4 kg)    Physical Exam  Constitutional: She is oriented to person, place, and time. She appears well-developed and well-nourished.  HENT:  Head: Normocephalic and atraumatic.  Neck: Neck supple.  Cardiovascular: Normal rate, regular rhythm and normal heart sounds.  Exam reveals no gallop and no friction rub.   No murmur heard. Pulmonary/Chest: Effort normal and breath sounds normal. She has no wheezes. She exhibits no tenderness.  Abdominal: Soft. Normal appearance and bowel sounds are normal. She exhibits no distension and no mass. There is no tenderness. There is no rebound and no guarding.  Musculoskeletal: Normal range of motion. She exhibits no edema or tenderness.  Lymphadenopathy:    She has no cervical adenopathy.  Neurological: She is alert and oriented to person, place, and time.  Skin: Skin is warm and dry.   Results for orders placed or performed in visit on 05/22/16  TSH  Result Value Ref Range   TSH 4.96 (H) mIU/L  BASIC METABOLIC PANEL WITH GFR  Result Value Ref Range   Sodium 141 135 -  146 mmol/L   Potassium 4.3 3.5 - 5.3 mmol/L   Chloride 108 98 - 110 mmol/L   CO2 21 20 - 31 mmol/L   Glucose, Bld 94 65 - 99 mg/dL   BUN 13 7 - 25 mg/dL   Creat 0.87 0.50 - 0.99 mg/dL   Calcium 8.4 (L) 8.6 - 10.4 mg/dL   GFR, Est African American 81 >=60 mL/min   GFR, Est Non African American 71 >=60 mL/min  Lipid panel  Result Value Ref Range   Cholesterol 207 (H) 125 - 200 mg/dL   Triglycerides 97 <150 mg/dL   HDL 63 >=46 mg/dL   Total CHOL/HDL Ratio 3.3 <=5.0 Ratio   VLDL 19 <30 mg/dL   LDL Cholesterol 125 <130  mg/dL      Assessment & Plan:   Problem List Items Addressed This Visit      Cardiovascular and Mediastinum   Hypertension    Controlled. Continue Losartan 25mg  once daily. Recheck 2 mos.       Relevant Orders   BASIC METABOLIC PANEL WITH GFR     Endocrine   Hypothyroidism - Primary    Dose adjusted last month, recheck 2 mos for dose titrations.       Relevant Orders   TSH    Other Visit Diagnoses   None.     No orders of the defined types were placed in this encounter.     Follow up plan: Return in about 8 weeks (around 08/21/2016) for TSH. BP.

## 2016-06-26 NOTE — Assessment & Plan Note (Signed)
Dose adjusted last month, recheck 2 mos for dose titrations.

## 2016-06-26 NOTE — Patient Instructions (Signed)
Your goal blood pressure is 140/90.  Work on low salt/sodium diet - goal <1.5gm (1,500mg) per day. Eat a diet high in fruits/vegetables and whole grains.  Look into mediterranean and DASH diet. Goal activity is 150min/wk of moderate intensity exercise.  This can be split into 30 minute chunks.  If you are not at this level, you can start with smaller 10-15 min increments and slowly build up activity. Look at www.heart.org for more resources 

## 2016-08-16 ENCOUNTER — Other Ambulatory Visit: Payer: Self-pay

## 2016-08-16 DIAGNOSIS — E034 Atrophy of thyroid (acquired): Secondary | ICD-10-CM

## 2016-08-16 DIAGNOSIS — I10 Essential (primary) hypertension: Secondary | ICD-10-CM

## 2016-08-17 ENCOUNTER — Other Ambulatory Visit: Payer: 59

## 2016-08-18 LAB — BASIC METABOLIC PANEL WITH GFR
BUN: 16 mg/dL (ref 7–25)
CHLORIDE: 106 mmol/L (ref 98–110)
CO2: 23 mmol/L (ref 20–31)
Calcium: 8.9 mg/dL (ref 8.6–10.4)
Creat: 1.13 mg/dL — ABNORMAL HIGH (ref 0.50–0.99)
GFR, EST AFRICAN AMERICAN: 59 mL/min — AB (ref >=60)
GFR, EST NON AFRICAN AMERICAN: 51 mL/min — AB (ref >=60)
GLUCOSE: 82 mg/dL (ref 65–99)
POTASSIUM: 4.3 mmol/L (ref 3.5–5.3)
Sodium: 139 mmol/L (ref 135–146)

## 2016-08-18 LAB — TSH: TSH: 2.93 mIU/L

## 2016-08-22 ENCOUNTER — Ambulatory Visit (INDEPENDENT_AMBULATORY_CARE_PROVIDER_SITE_OTHER): Payer: 59 | Admitting: Family Medicine

## 2016-08-22 ENCOUNTER — Ambulatory Visit
Admission: RE | Admit: 2016-08-22 | Discharge: 2016-08-22 | Disposition: A | Discharge status: Home / Self Care | Payer: 59 | Source: Ambulatory Visit | Attending: Family Medicine | Admitting: Family Medicine

## 2016-08-22 VITALS — BP 143/77 | HR 71 | Temp 97.9°F | Resp 16 | Ht 65.0 in | Wt 196.4 lb

## 2016-08-22 DIAGNOSIS — E034 Atrophy of thyroid (acquired): Secondary | ICD-10-CM | POA: Diagnosis not present

## 2016-08-22 DIAGNOSIS — E038 Other specified hypothyroidism: Secondary | ICD-10-CM | POA: Diagnosis not present

## 2016-08-22 DIAGNOSIS — I10 Essential (primary) hypertension: Secondary | ICD-10-CM

## 2016-08-22 DIAGNOSIS — M25511 Pain in right shoulder: Secondary | ICD-10-CM

## 2016-08-22 MED ORDER — LOSARTAN POTASSIUM 50 MG PO TABS
50.0000 mg | ORAL_TABLET | Freq: Every day | ORAL | 11 refills | Status: DC
Start: 2016-08-22 — End: 2016-09-18

## 2016-08-22 MED ORDER — NAPROXEN 500 MG PO TABS: 500.0000 mg | ORAL_TABLET | Freq: Two times a day (BID) | ORAL | 1 refills | Status: DC

## 2016-08-22 NOTE — Assessment & Plan Note (Signed)
Not controlled at home. Plan to increase losartan to help with better BP control. Recheck 1 mos.  Recommend eye exam for visual changes during headaches- likely typical migraine symptoms.

## 2016-08-22 NOTE — Patient Instructions (Signed)
Blood pressure: Please take 2 pills of Losartan until the bottle is empty and then pick up the new 50mg  pill.  Please let me know if your blood pressure drops below 100/60 or you fill dizzy. I do recommend getting an eye exam.   R shoulder pain: We will get an XR today to determine if it is arthritis. Take naproxen twice daily to with pain.  We will plan on having you see ortho for possible joint injections if the pain does not resolve.   Please seek immediate medical attention at ER or Urgent Care if you develop: Chest pain, pressure or tightness. Shortness of breath accompanied by nausea or diaphoresis Visual changes Numbness or tingling on one side of the body Facial droop Altered mental status Or any concerning symptoms.

## 2016-08-22 NOTE — Progress Notes (Signed)
Subjective:    Patient ID: Jillian Fritz, female    DOB: December 23, 1950, 65 y.o.   MRN: [id number]  HPI: Jillian Fritz is a 65 y.o. female presenting on 08/22/2016 for Hypertension   HPI  Pt presents for blood pressure recheck. It does fluctuate- went as high as 160- does cause headache symptoms. Goes up every other week. Sometimes happens when she drives. Has not had eye exam in sometime. No chest pain. No shortness of breath. No visual changes.  Has history of migraines- does have visual changes with migraines.  R shoulder pain when lifting the arm. Was a long standing issue- previous PCP told her it was arthritis and had her in PT. Imaged at least 5 years ago. Aches like a tooth ache at the level of glenohumeral joint. Pain is worsened when lifting the arm. No numbness. No tingling.   Past Medical History:  Diagnosis Date  . Asthma   . GERD (gastroesophageal reflux disease)   . Heart murmur   . Heart murmur    states she has 2 murmurs  . Hypertension   . Hypothyroidism   . Migraines   . Obesity   . Shortness of breath dyspnea     Current Outpatient Prescriptions on File Prior to Visit  Medication Sig  . ezetimibe (ZETIA) 10 MG tablet Take 1 tablet (10 mg total) by mouth daily.  . fluticasone (FLONASE) 50 MCG/ACT nasal spray PLACE 2 SPRAYS INTO BOTH NOSTRILS DAILY.  Marland Kitchen levothyroxine (SYNTHROID, LEVOTHROID) 75 MCG tablet Take 1 tablet (75 mcg total) by mouth daily.  . pantoprazole (PROTONIX) 40 MG tablet Take 1 tablet (40 mg total) by mouth daily.   No current facility-administered medications on file prior to visit.     Review of Systems  Constitutional: Negative for chills and fever.  Eyes: Positive for visual disturbance (with migraine symptoms).  Respiratory: Negative for cough, chest tightness and wheezing.   Cardiovascular: Negative for chest pain and leg swelling.  Gastrointestinal: Negative for abdominal pain, constipation, diarrhea, nausea and vomiting.    Endocrine: Negative.  Negative for cold intolerance, heat intolerance, polydipsia, polyphagia and polyuria.  Genitourinary: Negative for difficulty urinating and dysuria.  Musculoskeletal: Positive for arthralgias.  Neurological: Positive for headaches. Negative for dizziness, light-headedness and numbness.  Psychiatric/Behavioral: Negative.    Per HPI unless specifically indicated above     Objective:    BP (!) 143/77   Pulse 71   Temp 97.9 F (36.6 C) (Oral)   Resp 16   Ht 5\' 5"  (1.651 m)   Wt 196 lb 6.4 oz (89.1 kg)   BMI 32.68 kg/m   Wt Readings from Last 3 Encounters:  08/22/16 196 lb 6.4 oz (89.1 kg)  06/26/16 196 lb (88.9 kg)  05/22/16 192 lb (87.1 kg)    Physical Exam  Constitutional: She is oriented to person, place, and time. She appears well-developed and well-nourished.  HENT:  Head: Normocephalic and atraumatic.  Eyes: Conjunctivae and EOM are normal. Pupils are equal, round, and reactive to light. Right eye exhibits no nystagmus. Left eye exhibits no nystagmus.  + Red relfex both eyes.   Neck: Neck supple.  Cardiovascular: Normal rate, regular rhythm and normal heart sounds.  Exam reveals no gallop and no friction rub.   No murmur heard. Pulmonary/Chest: Effort normal and breath sounds normal. She has no wheezes. She exhibits no tenderness.  Abdominal: Soft. Normal appearance and bowel sounds are normal. She exhibits no distension and no mass. There is  no tenderness. There is no rebound and no guarding.  Musculoskeletal: Normal range of motion. She exhibits no edema.       Right shoulder: She exhibits tenderness (anterior shoulder near glenohumeral joint.), bony tenderness (AC joint tenderness) and pain. She exhibits no swelling, no crepitus and no deformity.  + painful arc test.  Mild decrease in strength when testing supraspinatus- likely 2/2 pain and guarding,  5/5 strength when testing the infraspinous and teres minor.  Apley scratch test intact.    Lymphadenopathy:    She has no cervical adenopathy.  Neurological: She is alert and oriented to person, place, and time.  Skin: Skin is warm and dry.   Results for orders placed or performed in visit on 08/16/16  TSH  Result Value Ref Range   TSH 2.93 mIU/L  BASIC METABOLIC PANEL WITH GFR  Result Value Ref Range   Sodium 139 135 - 146 mmol/L   Potassium 4.3 3.5 - 5.3 mmol/L   Chloride 106 98 - 110 mmol/L   CO2 23 20 - 31 mmol/L   Glucose, Bld 82 65 - 99 mg/dL   BUN 16 7 - 25 mg/dL   Creat 1.13 (H) 0.50 - 0.99 mg/dL   Calcium 8.9 8.6 - 10.4 mg/dL   GFR, Est African American 59 (L) >=60 mL/min   GFR, Est Non African American 51 (L) >=60 mL/min      Assessment & Plan:   Problem List Items Addressed This Visit      Cardiovascular and Mediastinum   Hypertension - Primary    Not controlled at home. Plan to increase losartan to help with better BP control. Recheck 1 mos.  Recommend eye exam for visual changes during headaches- likely typical migraine symptoms.       Relevant Medications   losartan (COZAAR) 50 MG tablet     Endocrine   Hypothyroidism    Dosing is stable. Recheck 3 mos due to just finding the correct dosing.        Other Visit Diagnoses    Right shoulder pain       Impingement vs arthritis. Will get imaging today. Naproxen BID. Consider ortho and PT if pain persists. Recheck 1 mos.    Relevant Medications   naproxen (NAPROSYN) 500 MG tablet   Other Relevant Orders   DG Shoulder Right (Completed)      Meds ordered this encounter  Medications  . losartan (COZAAR) 50 MG tablet    Sig: Take 1 tablet (50 mg total) by mouth daily.    Dispense:  30 tablet    Refill:  11    Order Specific Question:   Supervising Provider    Answer:   Arlis Porta [phone]  . naproxen (NAPROSYN) 500 MG tablet    Sig: Take 1 tablet (500 mg total) by mouth 2 (two) times daily with a meal.    Dispense:  30 tablet    Refill:  1    Order Specific Question:    Supervising Provider    Answer:   Arlis Porta [id number]      Follow up plan: Return in about 4 weeks (around 09/19/2016), or if symptoms worsen or fail to improve, for BP.

## 2016-08-22 NOTE — Assessment & Plan Note (Signed)
Dosing is stable. Recheck 3 mos due to just finding the correct dosing.

## 2016-09-18 ENCOUNTER — Encounter: Payer: Self-pay | Admitting: Family Medicine

## 2016-09-18 ENCOUNTER — Ambulatory Visit (INDEPENDENT_AMBULATORY_CARE_PROVIDER_SITE_OTHER): Payer: 59 | Admitting: Family Medicine

## 2016-09-18 DIAGNOSIS — G43709 Chronic migraine without aura, not intractable, without status migrainosus: Secondary | ICD-10-CM | POA: Diagnosis not present

## 2016-09-18 DIAGNOSIS — I10 Essential (primary) hypertension: Secondary | ICD-10-CM

## 2016-09-18 MED ORDER — LOSARTAN POTASSIUM 50 MG PO TABS
50.0000 mg | ORAL_TABLET | Freq: Every day | ORAL | 3 refills | Status: DC
Start: 2016-09-18 — End: 2017-04-26

## 2016-09-18 MED ORDER — SUMATRIPTAN-NAPROXEN SODIUM 85-500 MG PO TABS: ORAL_TABLET | ORAL | 5 refills | Status: DC

## 2016-09-18 NOTE — Assessment & Plan Note (Signed)
Headaches appear to be migrainous in nature and track with her previous symptoms. No clinical indication for imaging at this time but recommend opthalmologic exam due to visual changes. Pt has appt scheduled for Friday. Trial of Treximet to help with symptoms since pt had good success in the past. Encouraged to keep HA diary. Return if HA increase in frequency or visual changes last longer. Alarm symptoms reviewed to ER with concerning HA.  Consider adding migraine prophylaxis if symptoms continue.

## 2016-09-18 NOTE — Patient Instructions (Signed)
We will keep your blood pressure medication the same today. Continue to monitor blood pressure at home.   Please continue to keep an eye on the headaches. Take Treximet at onset of headache if needed. Take no more than 5 per month. Please let us know if HA's increase in frequency. Visual changes last for longer periods.  Please go to the ER for a headache with loss of vision, numbness tingling, severe pain, or other concerning symptoms.  Your goal blood pressure is 140/90 Work on low salt/sodium diet - goal <1.5gm (1,500mg ) per day. Eat a diet high in fruits/vegetables and whole grains.  Look into mediterranean and DASH diet. Goal activity is 156min/wk of moderate intensity exercise.  This can be split into 30 minute chunks.  If you are not at this level, you can start with smaller 10-15 min increments and slowly build up activity. Look at [place].org for more resources

## 2016-09-18 NOTE — Progress Notes (Signed)
Subjective:    Patient ID: Jillian Fritz, female    DOB: 1951/06/28, 65 y.o.   MRN: [id number]  HPI: Jillian Fritz is a 65 y.o. female presenting on 09/18/2016 for Hypertension   HPI  Pt presents for follow-up of hypertension. Increased dose of cozaar to 50mg - BP is checkied at home and still fluctuating. At home avg at home is 130/70-80 highs in the 140's. No chest pain or visual changes.  Still having headaches. Checked Bp during headaches and it was not elevated. Visual changes occur with headaches. Headaches similar to previous migraines but not as intense. The visual changes have never occurred as long in the past. Nauseated with headache. No focal neurological changes. No numbness or tingling. Does not wake her up from sleep. Sensitive to light with headache. Had been doing well with change in BP medication but on Sunday she had one again. Prior to medication change pt had not had a migraines for a few years. Started with migraines in the second grade. Previously seen by Dr. Jalene Mullet a headache specialist. Migraines are triggered by light. Plans to get an eye exam this week.   Past Medical History:  Diagnosis Date  . Asthma   . GERD (gastroesophageal reflux disease)   . Heart murmur   . Heart murmur    states she has 2 murmurs  . Hypertension   . Hypothyroidism   . Migraines   . Obesity   . Shortness of breath dyspnea     Current Outpatient Prescriptions on File Prior to Visit  Medication Sig  . ezetimibe (ZETIA) 10 MG tablet Take 1 tablet (10 mg total) by mouth daily.  . fluticasone (FLONASE) 50 MCG/ACT nasal spray PLACE 2 SPRAYS INTO BOTH NOSTRILS DAILY.  Marland Kitchen levothyroxine (SYNTHROID, LEVOTHROID) 75 MCG tablet Take 1 tablet (75 mcg total) by mouth daily.  . pantoprazole (PROTONIX) 40 MG tablet Take 1 tablet (40 mg total) by mouth daily.   No current facility-administered medications on file prior to visit.     Review of Systems  Constitutional: Negative for chills  and fever.  Eyes: Positive for photophobia and visual disturbance.  Respiratory: Negative for cough, chest tightness, shortness of breath and wheezing.   Cardiovascular: Negative for chest pain, palpitations and leg swelling.  Gastrointestinal: Negative for abdominal pain, constipation, diarrhea, nausea and vomiting.  Endocrine: Negative.  Negative for cold intolerance, heat intolerance, polydipsia, polyphagia and polyuria.  Genitourinary: Negative for difficulty urinating and dysuria.  Musculoskeletal: Negative.   Neurological: Positive for headaches. Negative for dizziness, syncope, weakness, light-headedness and numbness.  Psychiatric/Behavioral: Negative.  Negative for agitation. The patient is not nervous/anxious.    Per HPI unless specifically indicated above     Objective:    BP 130/72   Pulse 67   Temp 98.3 F (36.8 C) (Oral)   Resp 16   Ht 5\' 5"  (1.651 m)   Wt 198 lb (89.8 kg)   BMI 32.95 kg/m   Wt Readings from Last 3 Encounters:  09/18/16 198 lb (89.8 kg)  08/22/16 196 lb 6.4 oz (89.1 kg)  06/26/16 196 lb (88.9 kg)    Physical Exam  Constitutional: She is oriented to person, place, and time. She appears well-developed and well-nourished.  HENT:  Head: Normocephalic and atraumatic.  Eyes: Conjunctivae, EOM and lids are normal. Pupils are equal, round, and reactive to light. Right eye exhibits no nystagmus. Left eye exhibits no nystagmus.  Fundoscopic exam:      The right eye shows  red reflex.       The left eye shows red reflex.  Neck: Neck supple.  Cardiovascular: Normal rate, regular rhythm and normal heart sounds.  Exam reveals no gallop and no friction rub.   No murmur heard. Pulmonary/Chest: Effort normal and breath sounds normal. She has no wheezes. She exhibits no tenderness.  Abdominal: Soft. Normal appearance and bowel sounds are normal. She exhibits no distension and no mass. There is no tenderness. There is no rebound and no guarding.  Musculoskeletal:  Normal range of motion. She exhibits no edema or tenderness.  Lymphadenopathy:    She has no cervical adenopathy.  Neurological: She is alert and oriented to person, place, and time. She has normal strength. No cranial nerve deficit or sensory deficit. She displays a negative Romberg sign. Coordination and gait normal. GCS eye subscore is 4. GCS verbal subscore is 5. GCS motor subscore is 6.  Reflex Scores:      Patellar reflexes are 2+ on the right side and 2+ on the left side. Finger to nose intact. 5/5 strength UE and LE. Heel to shin intact.   Skin: Skin is warm and dry.   Results for orders placed or performed in visit on 08/16/16  TSH  Result Value Ref Range   TSH 2.93 mIU/L  BASIC METABOLIC PANEL WITH GFR  Result Value Ref Range   Sodium 139 135 - 146 mmol/L   Potassium 4.3 3.5 - 5.3 mmol/L   Chloride 106 98 - 110 mmol/L   CO2 23 20 - 31 mmol/L   Glucose, Bld 82 65 - 99 mg/dL   BUN 16 7 - 25 mg/dL   Creat 1.13 (H) 0.50 - 0.99 mg/dL   Calcium 8.9 8.6 - 10.4 mg/dL   GFR, Est African American 59 (L) >=60 mL/min   GFR, Est Non African American 51 (L) >=60 mL/min      Assessment & Plan:   Problem List Items Addressed This Visit      Cardiovascular and Mediastinum   Hypertension    Controlled with Losartan 50mg  once daily. Continue current regimen. Encouraged pt to continue to check at home. Reviewed DASH diet. Alarm symptoms reviewed. Recheck 3 mos.       Relevant Medications   losartan (COZAAR) 50 MG tablet   Chronic migraine without aura, not intractable    Headaches appear to be migrainous in nature and track with her previous symptoms. No clinical indication for imaging at this time but recommend opthalmologic exam due to visual changes. Pt has appt scheduled for Friday. Trial of Treximet to help with symptoms since pt had good success in the past. Encouraged to keep HA diary. Return if HA increase in frequency or visual changes last longer. Alarm symptoms reviewed to ER  with concerning HA.  Consider adding migraine prophylaxis if symptoms continue.       Relevant Medications   losartan (COZAAR) 50 MG tablet   SUMAtriptan-naproxen (TREXIMET) 85-500 MG tablet    Other Visit Diagnoses   None.     Meds ordered this encounter  Medications  . losartan (COZAAR) 50 MG tablet    Sig: Take 1 tablet (50 mg total) by mouth daily.    Dispense:  90 tablet    Refill:  3    Order Specific Question:   Supervising Provider    Answer:   Arlis Porta [phone]  . SUMAtriptan-naproxen (TREXIMET) 85-500 MG tablet    Sig: Take 1 tablet at the onset of headache.  May repeat in 2 hours if needed. Take no more than 5 tablets per month.    Dispense:  10 tablet    Refill:  5    Order Specific Question:   Supervising Provider    Answer:   Arlis Porta [id number]      Follow up plan: Return in about 3 months (around 12/19/2016), or if symptoms worsen or fail to improve.

## 2016-09-18 NOTE — Assessment & Plan Note (Signed)
Controlled with Losartan 50mg  once daily. Continue current regimen. Encouraged pt to continue to check at home. Reviewed DASH diet. Alarm symptoms reviewed. Recheck 3 mos.

## 2016-12-31 ENCOUNTER — Ambulatory Visit (INDEPENDENT_AMBULATORY_CARE_PROVIDER_SITE_OTHER): Payer: 59 | Admitting: Family Medicine

## 2016-12-31 ENCOUNTER — Encounter: Payer: Self-pay | Admitting: Family Medicine

## 2016-12-31 VITALS — BP 154/87 | HR 87 | Resp 16 | Ht 65.0 in | Wt 194.0 lb

## 2016-12-31 DIAGNOSIS — J209 Acute bronchitis, unspecified: Secondary | ICD-10-CM | POA: Diagnosis not present

## 2016-12-31 DIAGNOSIS — J4521 Mild intermittent asthma with (acute) exacerbation: Secondary | ICD-10-CM | POA: Insufficient documentation

## 2016-12-31 DIAGNOSIS — I10 Essential (primary) hypertension: Secondary | ICD-10-CM | POA: Diagnosis not present

## 2016-12-31 MED ORDER — BENZONATATE 100 MG PO CAPS: 100.0000 mg | ORAL_CAPSULE | Freq: Three times a day (TID) | ORAL | 0 refills | Status: DC | PRN

## 2016-12-31 MED ORDER — ALBUTEROL SULFATE HFA 108 (90 BASE) MCG/ACT IN AERS: 2.0000 | INHALATION_SPRAY | RESPIRATORY_TRACT | 1 refills | Status: DC | PRN

## 2016-12-31 MED ORDER — PREDNISONE 50 MG PO TABS: 50.0000 mg | ORAL_TABLET | Freq: Every day | ORAL | 0 refills | Status: DC

## 2016-12-31 NOTE — Patient Instructions (Signed)
Thank you for coming in to clinic today.  1. It sounds like you had an Upper Respiratory Virus that has settled into a Bronchitis, lower respiratory tract infection. I don't have concerns for pneumonia today, and think that this should gradually improve. Once you are feeling better, the cough may take a few weeks to fully resolve. I do hear wheezing and coarse breath sounds, this may be due to the virus, likely due to history of Asthma. - Start Prednisone 50mg  daily for next 5 days - this will open up lungs allow you to breath better and treat that wheezing or bronchospasm - Use Albuterol inhaler 2 puffs every 4-6 hours around the clock for next 2-3 days, max up to 5 days then use as needed (ask pharmacist to demonstrate proper inhaler use) - Start Tessalon perls cough medicine one every 8 hours or 3 times a day as needed for cough - IF not improving after 24-48 hours, or develop worsening fever, productive cough then SEND MY CHART MESSAGE OR CALL us LEAVE MESSAGE TO REQUEST ANTIBIOTIC, start Azithromycin Z pak (antibiotic) 2 tabs day 1, then 1 tab x 4 days, complete entire course even if improved - If THICKER chest congestion difficulty coughing it up, then try OTC Mucinex for 1 week or less, to help clear the mucus - In about 1 week or so, it is fine to go ahead and start Loratadine (Claritin) 10mg  daily and Flonase 2 sprays in each nostril daily for next 4-6 weeks, then you may stop and use seasonally or as needed - Drink plenty of fluids to improve congestion  If your symptoms seem to worsen instead of improve over next several days, including significant fever / chills, worsening shortness of breath, worsening wheezing, or nausea / vomiting and can't take medicines - return sooner or go to hospital Emergency Department for more immediate treatment.  Please schedule a follow-up appointment with Dr. Parks Ranger in 1-2 weeks as needed for bronchitis, otherwise plan routine follow-up for Blood Pressure  in 1-2 months, bring BP log to visit  Then next plan is for Annual Physical in 05/2017 with blood work  If you have any other questions or concerns, please feel free to call the clinic or send a message through [facility]. You may also schedule an earlier appointment if necessary.  [name], DO [place]

## 2016-12-31 NOTE — Progress Notes (Signed)
Subjective:    Patient ID: Jillian Fritz, female    DOB: 1951-08-18, 66 y.o.   MRN: [id number]  Jillian Fritz is a 66 y.o. female presenting on 12/31/2016 for Cough (chest congestion HA no chills onset cough is week but getting worst now )  Patient presents for a same day appointment.  HPI  ACUTE BRONCHITIS Reports symptoms started about 1 week ago with URI symptoms congestion and cough, without improvement, now worsening and more constant cough, worse laying down at night will feel more chest congestion, some productive in morning. - Has history of Childhood Asthma and Exercise-Induced Asthma, however has mostly outgrown this and has very rare flare up. - Tried OTC NyQuil and Robitussin only mild relief - Did receive Flu vaccine this season 08/2016 - PMH HTN however has not taken her blood pressure pill today - Admits associated headaches intermittently - Admits occasional nausea associated with persistent coughing when tries to eat, will get choked at times - Denies any fevers/chills, sweats, vomiting, diarrhea, abdominal pain  CHRONIC HTN: Reports relatively new diagnosis within past 1 year with HTN. She has a BP cuff at home, no longer checking regularly. Current Meds - Losartan 50mg  daily (was previously on 25mg  daily back 05/2016, and has gradually increased), no other BP meds. 0  Reports good compliance otherwise did NOT take med today. Tolerating well, w/o complaints. Denies CP, dyspnea, edema, dizziness / lightheadedness  Social History  Substance Use Topics  . Smoking status: Never Smoker  . Smokeless tobacco: Never Used  . Alcohol use No    Review of Systems Per HPI unless specifically indicated above     Objective:    BP (!) 154/87 (BP Location: Right Arm, Patient Position: Sitting, Cuff Size: Normal)   Pulse 87   Resp 16   Ht 5\' 5"  (1.651 m)   Wt 194 lb (88 kg)   SpO2 96%   BMI 32.28 kg/m   Wt Readings from Last 3 Encounters:  12/31/16 194 lb (88  kg)  09/18/16 198 lb (89.8 kg)  08/22/16 196 lb 6.4 oz (89.1 kg)    Physical Exam  Constitutional: She is oriented to person, place, and time. She appears well-developed and well-nourished. No distress.  Well-appearing, comfortable, cooperative  HENT:  Head: Normocephalic and atraumatic.  Mouth/Throat: Oropharynx is clear and moist.  Frontal / maxillary sinuses non-tender. Nares patent without purulence or edema. Bilateral TMs clear without erythema, effusion or bulging. Oropharynx clear without erythema, exudates, edema or asymmetry.  Eyes: Conjunctivae are normal. Right eye exhibits no discharge. Left eye exhibits no discharge.  Neck: Normal range of motion. Neck supple.  Some anterior neck fullness with mild discomfort R anterior cervical region without focal LAD  Cardiovascular: Normal rate, regular rhythm, normal heart sounds and intact distal pulses.   No murmur heard. Pulmonary/Chest: Effort normal. No respiratory distress. She has wheezes (Diffuse coarse sounds with some exp wheezes). She has no rales.  Good air movement still, but diffuse mild coarse sounds with some upper airway transmitted sounds, and mild exp wheezing. Speaks full sentences. No regular coughing during visit.  Musculoskeletal: She exhibits no edema.  Lymphadenopathy:    She has no cervical adenopathy.  Neurological: She is alert and oriented to person, place, and time.  Skin: Skin is warm and dry. No rash noted. She is not diaphoretic. No erythema.  Psychiatric: Her behavior is normal.  Nursing note and vitals reviewed.   I have personally reviewed the following lab results  from 08/2016.  Results for orders placed or performed in visit on 08/16/16  TSH  Result Value Ref Range   TSH 2.93 mIU/L  BASIC METABOLIC PANEL WITH GFR  Result Value Ref Range   Sodium 139 135 - 146 mmol/L   Potassium 4.3 3.5 - 5.3 mmol/L   Chloride 106 98 - 110 mmol/L   CO2 23 20 - 31 mmol/L   Glucose, Bld 82 65 - 99 mg/dL   BUN  16 7 - 25 mg/dL   Creat 1.13 (H) 0.50 - 0.99 mg/dL   Calcium 8.9 8.6 - 10.4 mg/dL   GFR, Est African American 59 (L) >=60 mL/min   GFR, Est Non African American 51 (L) >=60 mL/min      Assessment & Plan:   Problem List Items Addressed This Visit    Mild intermittent asthma with acute exacerbation    Consistent with mild acute asthma exacerbation in setting of very well controlled mild intermittent asthma with history only of some exercise induced symptoms, otherwise virtually no problem with asthma routinely except with certain URI triggers. - Current flare triggered by viral URI / bronchitis. No recent asthma flares. No recent prednisone / hospitalization - Not on controller meds. Does not have rescue albuterol inhaler - Mild wheezing on exam without focal lung crackles, no evidence of PNA, not consistent with sinusitis, no CP or SOB.  Plan: 1. Start Prednisone burst 50mg  daily x 5 days 2. Ordered new Albuterol 2 puffs q 4-6 hour PRN wheezing/cough/SOB  3. Tessalon PRN cough, advised continue supportive care for bronchitis 4. Return criteria given to follow-up vs when to go to ED. If worsening bronchitis 24-48 hr can send Z-pak       Relevant Medications   predniSONE (DELTASONE) 50 MG tablet   benzonatate (TESSALON) 100 MG capsule   albuterol (PROVENTIL HFA;VENTOLIN HFA) 108 (90 Base) MCG/ACT inhaler   Hypertension    Elevated BP today with acute bronchitis and illness, has had prior abnormal BPs. Only on Losartan 50mg  daily, no prior other meds, and only treated within past 1 year. No known complications, has had mild elevated Cr to 1.13 last checked 08/2016  Plan: 1. Monitor BP at home, given log, record readings, bring to next visit 2. Continue Losartan 50mg  daily for now, no change given acute illness 3. Follow-up 1-2 months for BP, then anticipate annual physical after 05/22/17 for all labs including chemistry Cr trend       Other Visit Diagnoses    Acute bronchitis,  unspecified organism    -  Primary  Consistent with worsening bronchitis in setting of likely viral URI over past 1 week, now with more productive cough, worsening night-time symptoms, see above consistent with mild acute asthma exac  Plan: 1. Start Prednisone 50mg  daily x 5 days burst treatment given bronchospasm -  If not improving 24-48 hours, can notify office to request antibiotic, will send Azithromycin Z-pak x 5 days - Trial on Albuterol inhaler 2 puffs q 4-6 hour x 3-5 days regularly (discussed proper use, advised to ask pharmacist for demonstration) 2. - May start OTC Loratadine (Claritin) 10mg  daily and Flonase 2 sprays in each nostril daily for next 4-6 weeks, then may stop and use seasonally or as needed 3. Rx Tessalon Perls for cough PRN 4. ecommend trial OTC - Mucinex, Tylenol/Ibuprofen PRN, Nasal saline, lozenges, tea with honey/lemon 5. Return criteria reviewed, follow-up within 1 week if not improved    Relevant Medications   benzonatate (TESSALON) 100  MG capsule      Meds ordered this encounter  Medications  . predniSONE (DELTASONE) 50 MG tablet    Sig: Take 1 tablet (50 mg total) by mouth daily with breakfast.    Dispense:  5 tablet    Refill:  0  . benzonatate (TESSALON) 100 MG capsule    Sig: Take 1 capsule (100 mg total) by mouth 3 (three) times daily as needed for cough.    Dispense:  30 capsule    Refill:  0  . albuterol (PROVENTIL HFA;VENTOLIN HFA) 108 (90 Base) MCG/ACT inhaler    Sig: Inhale 2 puffs into the lungs every 4 (four) hours as needed for wheezing or shortness of breath (cough).    Dispense:  1 Inhaler    Refill:  1      Follow up plan: Return in about 1 month (around 01/30/2017) for blood pressure.  [name], [facility] 12/31/2016, 1:23 PM

## 2016-12-31 NOTE — Assessment & Plan Note (Signed)
Elevated BP today with acute bronchitis and illness, has had prior abnormal BPs. Only on Losartan 50mg  daily, no prior other meds, and only treated within past 1 year. No known complications, has had mild elevated Cr to 1.13 last checked 08/2016  Plan: 1. Monitor BP at home, given log, record readings, bring to next visit 2. Continue Losartan 50mg  daily for now, no change given acute illness 3. Follow-up 1-2 months for BP, then anticipate annual physical after 05/22/17 for all labs including chemistry Cr trend

## 2016-12-31 NOTE — Assessment & Plan Note (Signed)
Consistent with mild acute asthma exacerbation in setting of very well controlled mild intermittent asthma with history only of some exercise induced symptoms, otherwise virtually no problem with asthma routinely except with certain URI triggers. - Current flare triggered by viral URI / bronchitis. No recent asthma flares. No recent prednisone / hospitalization - Not on controller meds. Does not have rescue albuterol inhaler - Mild wheezing on exam without focal lung crackles, no evidence of PNA, not consistent with sinusitis, no CP or SOB.  Plan: 1. Start Prednisone burst 50mg  daily x 5 days 2. Ordered new Albuterol 2 puffs q 4-6 hour PRN wheezing/cough/SOB  3. Tessalon PRN cough, advised continue supportive care for bronchitis 4. Return criteria given to follow-up vs when to go to ED. If worsening bronchitis 24-48 hr can send Z-pak

## 2017-01-02 ENCOUNTER — Telehealth: Payer: Self-pay | Admitting: Family Medicine

## 2017-01-02 DIAGNOSIS — J209 Acute bronchitis, unspecified: Secondary | ICD-10-CM

## 2017-01-02 MED ORDER — AZITHROMYCIN 250 MG PO TABS: ORAL_TABLET | ORAL | 0 refills | Status: DC

## 2017-01-02 NOTE — Telephone Encounter (Signed)
Last seen 12/31/16 for acute bronchitis, see note for details. Treated with Prednisone, and advised patient to notify office if not improving after 48 hours, then would add Azithromycin Z-pak. Still with productive cough despite therapy. [facility].  [name], [facility] 01/02/2017, 4:00 PM

## 2017-01-02 NOTE — Telephone Encounter (Signed)
Pt. Called stating that she was not getting better she was requesting a zpac. Pt call back  # [phone]

## 2017-01-11 ENCOUNTER — Ambulatory Visit (INDEPENDENT_AMBULATORY_CARE_PROVIDER_SITE_OTHER): Payer: 59 | Admitting: Family Medicine

## 2017-01-11 ENCOUNTER — Encounter: Payer: Self-pay | Admitting: Family Medicine

## 2017-01-11 VITALS — BP 145/80 | HR 74 | Temp 98.3°F | Resp 16 | Ht 65.0 in | Wt 196.0 lb

## 2017-01-11 DIAGNOSIS — J4521 Mild intermittent asthma with (acute) exacerbation: Secondary | ICD-10-CM

## 2017-01-11 MED ORDER — SINGULAIR 10 MG PO TABS: 10.0000 mg | ORAL_TABLET | Freq: Every day | ORAL | 5 refills | Status: DC

## 2017-01-11 MED ORDER — BENZONATATE 100 MG PO CAPS: 100.0000 mg | ORAL_CAPSULE | Freq: Three times a day (TID) | ORAL | 0 refills | Status: DC | PRN

## 2017-01-11 NOTE — Progress Notes (Signed)
Subjective:    Patient ID: Jillian Fritz, female    DOB: 1951-10-21, 66 y.o.   MRN: [id number]  Jillian Fritz is a 66 y.o. female presenting on 01/11/2017 for Nasal Congestion (cough onset 01/29 follow up has improved some but still has cough)  Patient presents for a same day appointment.  HPI  FOLLOW-UP BRONCHITIS / ASTHMA EXACERBATION: - Last seen by me in office 12/31/16 for acute bronchitis with mild asthma exac, treated with prednisone 50mg  x 5 day burst, loratadine, flonase, tessalon, follow-up by phone on 1/31 with no significant improvement, treated with Azithromycin Z pak - Today reports overall she is much improved, about 75% better since Z-pak, finished about 5 days ago. Still concern with some cough, occasionally productive worse in morning, but no significant wheezing, using occasional albuterol with mild relief. Still taking Loratadine, Flonase (for past 2 weeks) only minor relief. Did not start Mucinex - She is concerned due to friends and co-workers saying that she is still coughing a lot and should go back to doctor, she does not have any specific concerns, is attributing it to her asthma, has history of flares few times a year, usually more in Spring due to allergies, never on maintenance therapy, no recent hospitalization for asthma, no night-time awakenings - Also admits her uncle passed away recently in [place], leaving tomorrow will be back within 3-4 days, and wanted to get checked out today before leaving - Denies any fevers/chills, sweats, vomiting, diarrhea, abdominal pain, sinus pain or pressure, ear pain, sore throat, chest pain or shortness of breath   Social History  Substance Use Topics  . Smoking status: Never Smoker  . Smokeless tobacco: Never Used  . Alcohol use No    Review of Systems Per HPI unless specifically indicated above     Objective:    BP (!) 145/80   Pulse 74   Temp 98.3 F (36.8 C) (Oral)   Resp 16   Ht 5\' 5"  (1.651 m)   Wt 196  lb (88.9 kg)   SpO2 99%   BMI 32.62 kg/m   Wt Readings from Last 3 Encounters:  01/11/17 196 lb (88.9 kg)  12/31/16 194 lb (88 kg)  09/18/16 198 lb (89.8 kg)    Physical Exam  Constitutional: She appears well-developed and well-nourished. No distress.  Well-appearing, comfortable, cooperative  HENT:  Head: Normocephalic and atraumatic.  Mouth/Throat: Oropharynx is clear and moist.  Frontal / maxillary sinuses non-tender. Nares patent without purulence or edema. Bilateral TMs clear without erythema, effusion or bulging. Oropharynx clear without erythema, exudates, edema or asymmetry.  Eyes: Conjunctivae are normal. Right eye exhibits no discharge. Left eye exhibits no discharge.  Neck: Normal range of motion. Neck supple.  Cardiovascular: Normal rate.   No murmur heard. Pulmonary/Chest: Effort normal and breath sounds normal. No respiratory distress. She has no wheezes (Resolved). She has no rales.  Good air movement.Improved breath sounds. Resolved wheezing. Speaks full sentences. No coughing.  Neurological: She is alert.  Skin: Skin is warm and dry. She is not diaphoretic.  Nursing note and vitals reviewed.      Assessment & Plan:    Problem List Items Addressed This Visit    Mild intermittent asthma with acute exacerbation - Primary    Significantly improved recent acute exac, now without wheezing, but still lingering cough likely secondary to her asthma in setting of recent bronchitis URI - Normally asthma is mild intermittent well controlled, not even on maintenance therapy, Spring allergy  and URI most common triggers, approx 2+ flares yearly - No recent improvement on Prednisone x 5 day - SpO2 99%  Plan: 1. Start Singulair 10mg  nightly, brand name ([employer] employee) #30 with refills, advised take nightly for at least 3 months, consider up to 6 months through Summer to see if reducing asthma symptoms 2. Add Mucinex-DM 5-7 days 3. Continue Loratadine, Flonase for up to 6 weeks,  about 4 more weeks then use PRN 4. Continue Albuterol PRN 5. Refilled Tessalon for cough 6. No antibiotic needed, reassurance today, follow-up as needed       Relevant Medications   benzonatate (TESSALON) 100 MG capsule   SINGULAIR 10 MG tablet         Meds ordered this encounter  Medications  . benzonatate (TESSALON) 100 MG capsule    Sig: Take 1 capsule (100 mg total) by mouth 3 (three) times daily as needed for cough.    Dispense:  15 capsule    Refill:  0  . SINGULAIR 10 MG tablet    Sig: Take 1 tablet (10 mg total) by mouth at bedtime.    Dispense:  30 tablet    Refill:  5    Fill Brand name Singulair      Follow up plan: Return in about 2 weeks (around 01/25/2017), or if symptoms worsen or fail to improve, for bronchitis / asthma.  [name], [facility] 01/11/2017, 2:16 PM

## 2017-01-11 NOTE — Patient Instructions (Signed)
Thank you for coming in to clinic today.  1. I'm encouraged you are improving after the treatments, but now the lingering cough is most likely related to asthma - Refilled Tessalon take as needed for cough - For short term relief, start Mucinex-DM 2-3 times a day 5-7 days - Continue Loratadine, Flonase for up to 6 weeks total - May continue Albuterol if helping - Added new Singulair 10mg  nightly for prevention of asthma flare, may take several weeks for benefit, plan to take this for 3-6 months - Drink plenty of fluids to improve congestion  Follow-up as needed if not improved within 1-2 weeks  If you have any other questions or concerns, please feel free to call the clinic or send a message through [place]. You may also schedule an earlier appointment if necessary.  [name], DO [place]

## 2017-01-11 NOTE — Assessment & Plan Note (Addendum)
Significantly improved recent acute exac, now without wheezing, but still lingering cough likely secondary to her asthma in setting of recent bronchitis URI - Normally asthma is mild intermittent well controlled, not even on maintenance therapy, Spring allergy and URI most common triggers, approx 2+ flares yearly - No recent improvement on Prednisone x 5 day - SpO2 99%  Plan: 1. Start Singulair 10mg  nightly, brand name ([employer] employee) #30 with refills, advised take nightly for at least 3 months, consider up to 6 months through Summer to see if reducing asthma symptoms 2. Add Mucinex-DM 5-7 days 3. Continue Loratadine, Flonase for up to 6 weeks, about 4 more weeks then use PRN 4. Continue Albuterol PRN 5. Refilled Tessalon for cough 6. No antibiotic needed, reassurance today, follow-up as needed

## 2017-02-18 ENCOUNTER — Other Ambulatory Visit: Payer: Self-pay | Admitting: *Deleted

## 2017-02-18 ENCOUNTER — Other Ambulatory Visit: Payer: Self-pay | Admitting: Family Medicine

## 2017-02-18 DIAGNOSIS — E785 Hyperlipidemia, unspecified: Secondary | ICD-10-CM

## 2017-02-18 MED ORDER — EZETIMIBE 10 MG PO TABS: 10.0000 mg | ORAL_TABLET | Freq: Every day | ORAL | 3 refills | Status: DC

## 2017-03-06 ENCOUNTER — Other Ambulatory Visit: Payer: Self-pay | Admitting: Family Medicine

## 2017-03-06 DIAGNOSIS — E785 Hyperlipidemia, unspecified: Secondary | ICD-10-CM

## 2017-03-06 MED ORDER — EZETIMIBE 10 MG PO TABS: 10.0000 mg | ORAL_TABLET | Freq: Every day | ORAL | 3 refills | Status: DC

## 2017-03-13 ENCOUNTER — Encounter: Payer: Self-pay | Admitting: *Deleted

## 2017-04-11 ENCOUNTER — Other Ambulatory Visit: Payer: Self-pay

## 2017-04-11 DIAGNOSIS — K222 Esophageal obstruction: Principal | ICD-10-CM

## 2017-04-11 DIAGNOSIS — K219 Gastro-esophageal reflux disease without esophagitis: Secondary | ICD-10-CM

## 2017-04-11 MED ORDER — PANTOPRAZOLE SODIUM 40 MG PO TBEC: 40.0000 mg | DELAYED_RELEASE_TABLET | Freq: Every day | ORAL | 3 refills | Status: DC

## 2017-04-11 NOTE — Telephone Encounter (Signed)
Last ov  01/11/17 Last filled 04/11/16

## 2017-04-26 ENCOUNTER — Other Ambulatory Visit: Payer: Self-pay | Admitting: Family Medicine

## 2017-04-26 ENCOUNTER — Ambulatory Visit (INDEPENDENT_AMBULATORY_CARE_PROVIDER_SITE_OTHER): Payer: 59 | Admitting: Family Medicine

## 2017-04-26 ENCOUNTER — Encounter: Payer: Self-pay | Admitting: Family Medicine

## 2017-04-26 VITALS — BP 150/80 | HR 67 | Temp 98.3°F | Resp 16 | Ht 65.0 in | Wt 197.0 lb

## 2017-04-26 DIAGNOSIS — Z0000 Encounter for general adult medical examination without abnormal findings: Secondary | ICD-10-CM

## 2017-04-26 DIAGNOSIS — G43709 Chronic migraine without aura, not intractable, without status migrainosus: Secondary | ICD-10-CM

## 2017-04-26 DIAGNOSIS — E034 Atrophy of thyroid (acquired): Secondary | ICD-10-CM

## 2017-04-26 DIAGNOSIS — R7309 Other abnormal glucose: Secondary | ICD-10-CM

## 2017-04-26 DIAGNOSIS — I10 Essential (primary) hypertension: Secondary | ICD-10-CM | POA: Diagnosis not present

## 2017-04-26 DIAGNOSIS — Z23 Encounter for immunization: Secondary | ICD-10-CM | POA: Diagnosis not present

## 2017-04-26 DIAGNOSIS — E782 Mixed hyperlipidemia: Secondary | ICD-10-CM

## 2017-04-26 MED ORDER — LOSARTAN POTASSIUM 50 MG PO TABS: 50.0000 mg | ORAL_TABLET | Freq: Every day | ORAL | 3 refills | Status: DC

## 2017-04-26 MED ORDER — HYDROCHLOROTHIAZIDE 12.5 MG PO CAPS: 12.5000 mg | ORAL_CAPSULE | Freq: Every day | ORAL | 3 refills | Status: DC

## 2017-04-26 NOTE — Assessment & Plan Note (Addendum)
Stable chronic hypothyroidism Check TSH 4 weeks with labs Continue current Levothyroxine 64mcg daily, not due for refill yet, will order after TSH review

## 2017-04-26 NOTE — Patient Instructions (Signed)
Thank you for coming to the clinic today.  1.  [facility] Start new rx HCTZ 12.5mg  daily in morning - this is a mild fluid pill, may help swelling as well  Keep checking BP, WRITE DOWN readings, bring paper to next visit  You will be due for FASTING BLOOD WORK (no food or drink after midnight before, only water or coffee without cream/sugar on the morning of)  [facility] - get this done about < 1 week before your physical so I can get results  Please schedule a follow-up appointment with Dr. Parks Ranger in 4-6 weeks Annual Physical (after 6/20, after [facility])  If you have any other questions or concerns, please feel free to call the clinic or send a message through [place]. You may also schedule an earlier appointment if necessary.  [name], DO [place]

## 2017-04-26 NOTE — Assessment & Plan Note (Signed)
Still elevated BP today, even on manual re-check, home readings elevated. Out of Losartan for 2 weeks. Regardless, inadequately controlled No known complications, has had mild elevated Cr to 1.13 last checked 08/2016  Plan: 1. Refilled Losartan 50mg  daily 2. Add 2nd agent HCTZ 12.5mg  daily - counseled on side effects, benefits, may need to titrate up to 25mg  daily 3. Monitor BP at home, given log, record readings, bring to next visit 4. Follow-up 4 weeks Annual Physical, HTN review labs - monitor chemistry after start HCTZ

## 2017-04-26 NOTE — Progress Notes (Signed)
Subjective:    Patient ID: Jillian Fritz, female    DOB: 05/04/1951, 66 y.o.   MRN: [id number]  [name] is a 66 y.o. female presenting on 04/26/2017 for Hypertension   HPI   CHRONIC HTN: Reports initial dx new diagnosis within past 1 year with HTN. She has a BP cuff at home, checking BP since last visit with some abnormal readings still SBP >140-150s even on medication, also checking it at work. - Recently ran out of Losartan 50mg , not taken in 2 weeks due to "rx ran out through Mail Order" and needed it renewed Current Meds - Losartan 50mg  daily (out currently), no other BP meds Tolerating well, w/o complaints. - Does admit to some edema by end of day (intermittent worsening and improvement) Denies CP, dyspnea, edema, dizziness / lightheadedness, fatigue  Hypothyroidism: - Chronic problem, reports has been stable on current dose levothyroxine 53mcg daily, she is not sure if has any refills left. But thinks she has nearly a full bottle #90. - Due for next TSH in 4 weeks with Annual Physical, previously has been normal last 2 checks - Denies fatigue, hair changes, temperature instability  Chronic Migraine without aura: - Reports has been doing well without recent problems. She has occasional migraines, not always keeping track of these on a headache log. Usually seems to have migraine while at work, and they have excedrin migraine at work, and she does not bring her Sumatriptan to work, so rarely takes this, does not need new rx, only uses if emergency - Denies recent or active headache  Social History  Substance Use Topics  . Smoking status: Never Smoker  . Smokeless tobacco: Never Used  . Alcohol use No    Review of Systems Per HPI unless specifically indicated above     Objective:    BP (!) 150/80 (BP Location: Left Arm, Cuff Size: Normal)   Pulse 67   Temp 98.3 F (36.8 C) (Oral)   Resp 16   Ht 5\' 5"  (1.651 m)   Wt 197 lb (89.4 kg)   BMI 32.78 kg/m     Wt Readings from Last 3 Encounters:  04/26/17 197 lb (89.4 kg)  01/11/17 196 lb (88.9 kg)  12/31/16 194 lb (88 kg)    Physical Exam  Constitutional: She is oriented to person, place, and time. She appears well-developed and well-nourished. No distress.  Well-appearing, comfortable, cooperative  HENT:  Head: Normocephalic and atraumatic.  Mouth/Throat: Oropharynx is clear and moist.  Eyes: Conjunctivae are normal.  Neck: Normal range of motion. Neck supple.  Cardiovascular: Normal rate, regular rhythm, normal heart sounds and intact distal pulses.   No murmur heard. Pulmonary/Chest: Effort normal.  Musculoskeletal: Normal range of motion. She exhibits no edema.  Neurological: She is alert and oriented to person, place, and time.  Skin: Skin is warm and dry. No rash noted. She is not diaphoretic. No erythema.  Lower ankle / extremity varicose veins and spider veins  Psychiatric: She has a normal mood and affect. Her behavior is normal.  Nursing note and vitals reviewed.  Results for orders placed or performed in visit on 08/16/16  TSH  Result Value Ref Range   TSH 2.93 mIU/L  BASIC METABOLIC PANEL WITH GFR  Result Value Ref Range   Sodium 139 135 - 146 mmol/L   Potassium 4.3 3.5 - 5.3 mmol/L   Chloride 106 98 - 110 mmol/L   CO2 23 20 - 31 mmol/L   Glucose, Bld  82 65 - 99 mg/dL   BUN 16 7 - 25 mg/dL   Creat 1.13 (H) 0.50 - 0.99 mg/dL   Calcium 8.9 8.6 - 10.4 mg/dL   GFR, Est African American 59 (L) >=60 mL/min   GFR, Est Non African American 51 (L) >=60 mL/min      Assessment & Plan:   Problem List Items Addressed This Visit    Hypothyroidism    Stable chronic hypothyroidism Check TSH 4 weeks with labs Continue current Levothyroxine 21mcg daily, not due for refill yet, will order after TSH review      Hypertension - Primary    Still elevated BP today, even on manual re-check, home readings elevated. Out of Losartan for 2 weeks. Regardless, inadequately controlled No  known complications, has had mild elevated Cr to 1.13 last checked 08/2016  Plan: 1. Refilled Losartan 50mg  daily 2. Add 2nd agent HCTZ 12.5mg  daily - counseled on side effects, benefits, may need to titrate up to 25mg  daily 3. Monitor BP at home, given log, record readings, bring to next visit 4. Follow-up 4 weeks Annual Physical, HTN review labs - monitor chemistry after start HCTZ      Relevant Medications   hydrochlorothiazide (MICROZIDE) 12.5 MG capsule   losartan (COZAAR) 50 MG tablet   Chronic migraine without aura, not intractable    Stable chronic migraines Without prophylaxis. Continues PRN abortive therapy excedrin migraine vs rx Sumatriptan (rarely uses Follow-up PRN if worsening      Relevant Medications   hydrochlorothiazide (MICROZIDE) 12.5 MG capsule   losartan (COZAAR) 50 MG tablet    Other Visit Diagnoses    Need for vaccination with 13-polyvalent pneumococcal conjugate vaccine       Age 19 due for prevnar-13, next due 1 year pneumovax-23   Relevant Orders   Pneumococcal conjugate vaccine 13-valent IM (Completed)      Meds ordered this encounter  Medications  . hydrochlorothiazide (MICROZIDE) 12.5 MG capsule    Sig: Take 1 capsule (12.5 mg total) by mouth daily.    Dispense:  90 capsule    Refill:  3  . losartan (COZAAR) 50 MG tablet    Sig: Take 1 tablet (50 mg total) by mouth daily.    Dispense:  90 tablet    Refill:  3      Follow up plan: Return in about 5 weeks (around 05/31/2017) for Annual Physical.  Jillian Putnam, DO [facility] 04/26/2017, 10:44 PM

## 2017-04-26 NOTE — Assessment & Plan Note (Signed)
Stable chronic migraines Without prophylaxis. Continues PRN abortive therapy excedrin migraine vs rx Sumatriptan (rarely uses Follow-up PRN if worsening

## 2017-05-25 LAB — CBC WITH DIFFERENTIAL/PLATELET
BASOS ABS: 0.1 10*3/uL (ref 0.0–0.2)
Basos: 1 %
EOS (ABSOLUTE): 0.4 10*3/uL (ref 0.0–0.4)
Eos: 5 %
Hematocrit: 40.8 % (ref 34.0–46.6)
Hemoglobin: 13.4 g/dL (ref 11.1–15.9)
IMMATURE GRANS (ABS): 0 10*3/uL (ref 0.0–0.1)
IMMATURE GRANULOCYTES: 0 %
LYMPHS: 32 %
Lymphocytes Absolute: 2.6 10*3/uL (ref 0.7–3.1)
MCH: 29.6 pg (ref 26.6–33.0)
MCHC: 32.8 g/dL (ref 31.5–35.7)
MCV: 90 fL (ref 79–97)
Monocytes Absolute: 0.6 10*3/uL (ref 0.1–0.9)
Monocytes: 8 %
NEUTROS PCT: 54 %
Neutrophils Absolute: 4.4 10*3/uL (ref 1.4–7.0)
PLATELETS: 261 10*3/uL (ref 150–379)
RBC: 4.52 x10E6/uL (ref 3.77–5.28)
RDW: 13.7 % (ref 12.3–15.4)
WBC: 8 10*3/uL (ref 3.4–10.8)

## 2017-05-25 LAB — COMPREHENSIVE METABOLIC PANEL
ALBUMIN: 4.3 g/dL (ref 3.6–4.8)
ALK PHOS: 100 IU/L (ref 39–117)
ALT: 33 IU/L — ABNORMAL HIGH (ref 0–32)
AST: 24 IU/L (ref 0–40)
Albumin/Globulin Ratio: 2 (ref 1.2–2.2)
BILIRUBIN TOTAL: 0.5 mg/dL (ref 0.0–1.2)
BUN / CREAT RATIO: 19 (ref 12–28)
BUN: 18 mg/dL (ref 8–27)
CHLORIDE: 97 mmol/L (ref 96–106)
CO2: 25 mmol/L (ref 20–29)
Calcium: 9.2 mg/dL (ref 8.7–10.3)
Creatinine, Ser: 0.97 mg/dL (ref 0.57–1.00)
GFR calc Af Amer: 71 mL/min/{1.73_m2} (ref >59)
GFR calc non Af Amer: 61 mL/min/{1.73_m2} (ref >59)
GLOBULIN, TOTAL: 2.2 g/dL (ref 1.5–4.5)
GLUCOSE: 78 mg/dL (ref 65–99)
Potassium: 3.8 mmol/L (ref 3.5–5.2)
SODIUM: 141 mmol/L (ref 134–144)
Total Protein: 6.5 g/dL (ref 6.0–8.5)

## 2017-05-25 LAB — LIPID PANEL
CHOLESTEROL TOTAL: 230 mg/dL — AB (ref 100–199)
Chol/HDL Ratio: 3.7 ratio (ref 0.0–4.4)
HDL: 62 mg/dL (ref >39)
LDL Calculated: 145 mg/dL — ABNORMAL HIGH (ref 0–99)
Triglycerides: 114 mg/dL (ref 0–149)
VLDL Cholesterol Cal: 23 mg/dL (ref 5–40)

## 2017-05-25 LAB — TSH: TSH: 7.89 u[IU]/mL — AB (ref 0.450–4.500)

## 2017-05-25 LAB — HEMOGLOBIN A1C
ESTIMATED AVERAGE GLUCOSE: 114 mg/dL
Hgb A1c MFr Bld: 5.6 % (ref 4.8–5.6)

## 2017-05-27 ENCOUNTER — Other Ambulatory Visit: Payer: Self-pay | Admitting: Family Medicine

## 2017-05-27 DIAGNOSIS — E034 Atrophy of thyroid (acquired): Secondary | ICD-10-CM

## 2017-05-27 MED ORDER — LEVOTHYROXINE SODIUM 88 MCG PO TABS: 88.0000 ug | ORAL_TABLET | Freq: Every day | ORAL | 0 refills | Status: DC

## 2017-05-31 ENCOUNTER — Encounter: Payer: Self-pay | Admitting: Family Medicine

## 2017-05-31 ENCOUNTER — Ambulatory Visit (INDEPENDENT_AMBULATORY_CARE_PROVIDER_SITE_OTHER): Payer: 59 | Admitting: Family Medicine

## 2017-05-31 ENCOUNTER — Telehealth: Payer: Self-pay

## 2017-05-31 VITALS — BP 108/69 | HR 68 | Temp 98.2°F | Ht 66.0 in | Wt 196.0 lb

## 2017-05-31 DIAGNOSIS — Z23 Encounter for immunization: Secondary | ICD-10-CM

## 2017-05-31 DIAGNOSIS — I10 Essential (primary) hypertension: Secondary | ICD-10-CM

## 2017-05-31 DIAGNOSIS — Z8742 Personal history of other diseases of the female genital tract: Secondary | ICD-10-CM | POA: Insufficient documentation

## 2017-05-31 DIAGNOSIS — E034 Atrophy of thyroid (acquired): Secondary | ICD-10-CM | POA: Diagnosis not present

## 2017-05-31 DIAGNOSIS — E559 Vitamin D deficiency, unspecified: Secondary | ICD-10-CM | POA: Diagnosis not present

## 2017-05-31 DIAGNOSIS — Z780 Asymptomatic menopausal state: Secondary | ICD-10-CM

## 2017-05-31 DIAGNOSIS — Z1231 Encounter for screening mammogram for malignant neoplasm of breast: Secondary | ICD-10-CM | POA: Diagnosis not present

## 2017-05-31 DIAGNOSIS — E782 Mixed hyperlipidemia: Secondary | ICD-10-CM

## 2017-05-31 DIAGNOSIS — Z1239 Encounter for other screening for malignant neoplasm of breast: Secondary | ICD-10-CM

## 2017-05-31 DIAGNOSIS — Z13820 Encounter for screening for osteoporosis: Secondary | ICD-10-CM

## 2017-05-31 MED ORDER — ASPIRIN EC 81 MG PO TBEC: 81.0000 mg | DELAYED_RELEASE_TABLET | Freq: Every day | ORAL | Status: AC

## 2017-05-31 MED ORDER — ZOSTER VAC RECOMB ADJUVANTED 50 MCG/0.5ML IM SUSR: 0.5000 mL | INTRAMUSCULAR | 1 refills | Status: DC

## 2017-05-31 NOTE — Telephone Encounter (Signed)
Referral placed to [facility], for pap smear and routine GYN care, history of abnormal pap smear with colposcopy years ago, do not have available records on this result.  Jillian Fritz, [facility] 05/31/2017, 3:04 PM

## 2017-05-31 NOTE — Assessment & Plan Note (Signed)
Much improved control HTN on thiazide now - Home BP readings normal, did not bring sheet  No known complications    Plan:  1. Continue current BP regimen - Losartan 50mg  daily, HCTZ 12.5mg  daily 2. Encourage improved lifestyle - low sodium diet, start improving regular exercise 3. Less frequently but continue monitor BP outside office, bring readings to next visit, if persistently >140/90 or new symptoms notify office sooner 4. Follow-up 6 months - sooner 3 months if uncontrol BP

## 2017-05-31 NOTE — Assessment & Plan Note (Signed)
Uncontrolled cholesterol with abnormal LDL >140 otherwise normal, some poor lifestyle Last lipid panel 05/2017 Calculated ASCVD 10 yr risk score 9.1% with uncontrolled BP > now improved BP risk is about 6.7%  Plan: 1. Discussion on ASCVD risk and prevention - agree to start ASA 81mg  daily for primary prevention 2. Offered statin therapy - concern with prior myalgia, consider for future to do low dose rosuvastatin intermittent dosing only 3. Encourage improved lifestyle - low carb/cholesterol, reduce portion size, continue improving regular exercise 4. Follow-up 6 months - future lipids yearly

## 2017-05-31 NOTE — Assessment & Plan Note (Signed)
Clinically remains stable, recent elevated TSH >7 previously controlled, some fluctuating results Increased Levothyroxine before visit from 75 to 9mcg daily, new rx sent Re-check future lab ([facility]) TSH and Free T4 6 week to 3 months, follow-up

## 2017-05-31 NOTE — Patient Instructions (Addendum)
Thank you for coming to the clinic today.  1. Keep up the great work with BP - Continue to monitor BP - Continue current medications, no changes today  Start baby Aspirin 81mg  daily  In future consider adding Statin cholesterol med, rosuvastatin low dose intermittent dosing not every day  Continue thyroid inc dose as discussed 43mcg daily  Call [place] imaging for Mammogram Screening and DEXA Scan for osteoporosis, orders are in.  2.  Let me know which you prefer for referral for Pap Smear  [facility] [address], [place], [place] [address] Hours: [place] Main: [place]   Address: [address], [place], [place], [place], [place] [address] Hours: 8AM-5PM Phone: [phone]  You will be due for NON FASTING BLOOD WORK (no food or drink after midnight before, only water or coffee without cream/sugar on the morning of)  - Please go ahead and schedule a "Lab Only" visit in the morning at the clinic for lab draw in 6 weeks to 3 months   For Lab Results, once available within 2-3 days of blood draw, you can can log in to MyChart online to view your results and a brief explanation. Also, we can discuss results at next follow-up visit.   Please schedule a Follow-up Appointment to: Return in about 6 months (around 11/30/2017) for HTN, Thyroid, Cholesterol.  If you have any other questions or concerns, please feel free to call the clinic or send a message through [place]. You may also schedule an earlier appointment if necessary.  Additionally, you may be receiving a survey about your experience at our clinic within a few days to 1 week by e-mail or mail. We value your feedback.  [name], DO [place]

## 2017-05-31 NOTE — Progress Notes (Signed)
Subjective:    Patient ID: Jillian Fritz, female    DOB: 1951-01-10, 66 y.o.   MRN: [id number]  Jillian Fritz is a 66 y.o. female presenting on 05/31/2017 for Annual Exam   HPI   CHRONIC HTN: - Last visit with me 04/2017, for same problem, treated with added HCTZ 12.5mg , home BP monitoring, see prior notes for background information. - Today patient reports overall doing well with BP, home readings 110-120s and controlled on new medicine, previously 130-140s. Current Meds - Losartan 50mg  daily, HCTZ 12.5mg  daily Tolerating well, w/o complaints. - Improved edema Lifestyle: - Limited regular exercise Denies CP, dyspnea, edema, dizziness / lightheadedness, fatigue  HYPERLIPIDEMIA: - Reports no concerns. Last lipid panel 05/2017, abnormal LDL 145, otherwise within normal range - No longer on statin therapy. In years past she was on either 1 or 2 different ones before, cannot recall name, had side effect with muscle aches and myalgia, not interested to resume at this time - Used to take ASA 81mg  in past, stopped this, interested to resume  Hypothyroidism: - Chronic problem, previous dose Levothyroxine 44mcg daily - last labs with TSH elevated now >7, interval update with changed dose from 75 to 7mcg daily, new rx was sent in, she just started new one 2 days ago. No new concerns. - Agrees for re-check labs in future  Health Maintenance: - Due for Mammogram, last done 2017 prior no abnormal, has been done at [facility] - requests to return there - Due for DEXA osteoporosis screening - no prior imaging, has family history of osteoporosis, not currently on Vit D or Ca supplement - Due for pap smear, last done >3+ years ago, not established with GYN, has had prior abnormal pap requiring colposcopy, she will consider options for GYN referral for future pap - Due for Shingles vaccine - would like Shingrix if covered, requesting rx - UTD colonoscopy  Past Medical History:    Diagnosis Date  . Asthma   . GERD (gastroesophageal reflux disease)   . Heart murmur   . Heart murmur    states she has 2 murmurs  . Hypertension   . Hypothyroidism   . Migraines   . Obesity   . Shortness of breath dyspnea    Past Surgical History:  Procedure Laterality Date  . CERVICAL CONE BIOPSY    . ESOPHAGOGASTRODUODENOSCOPY (EGD) WITH PROPOFOL N/A 03/23/2016   Procedure: ESOPHAGOGASTRODUODENOSCOPY (EGD) WITH esophageal dilation. ;  Surgeon: [name], MD;  Location: [facility];  Service: Endoscopy;  Laterality: N/A;  . THROAT SURGERY  04/2014   Social History   Social History  . Marital status: Single    Spouse name: N/A  . Number of children: N/A  . Years of education: N/A   Occupational History  . [employer]    Social History Main Topics  . Smoking status: Never Smoker  . Smokeless tobacco: Never Used  . Alcohol use No  . Drug use: No  . Sexual activity: Not on file   Other Topics Concern  . Not on file   Social History Narrative  . No narrative on file   Family History  Problem Relation Age of Onset  . Alzheimer's disease Father   . Hypertension Mother    Current Outpatient Prescriptions on File Prior to Visit  Medication Sig  . albuterol (PROVENTIL HFA;VENTOLIN HFA) 108 (90 Base) MCG/ACT inhaler Inhale 2 puffs into the lungs every 4 (four) hours as needed for wheezing or shortness of  breath (cough).  . ezetimibe (ZETIA) 10 MG tablet Take 1 tablet (10 mg total) by mouth daily.  . fluticasone (FLONASE) 50 MCG/ACT nasal spray PLACE 2 SPRAYS INTO BOTH NOSTRILS DAILY.  . hydrochlorothiazide (MICROZIDE) 12.5 MG capsule Take 1 capsule (12.5 mg total) by mouth daily.  Marland Kitchen levothyroxine (SYNTHROID, LEVOTHROID) 88 MCG tablet Take 1 tablet (88 mcg total) by mouth daily before breakfast.  . losartan (COZAAR) 50 MG tablet Take 1 tablet (50 mg total) by mouth daily.  . pantoprazole (PROTONIX) 40 MG tablet Take 1 tablet (40 mg total) by mouth  daily.  Marland Kitchen SINGULAIR 10 MG tablet Take 1 tablet (10 mg total) by mouth at bedtime. (Patient not taking: Reported on 05/31/2017)  . SUMAtriptan-naproxen (TREXIMET) 85-500 MG tablet Take 1 tablet at the onset of headache. May repeat in 2 hours if needed. Take no more than 5 tablets per month. (Patient not taking: Reported on 05/31/2017)   No current facility-administered medications on file prior to visit.     Review of Systems  Constitutional: Negative for activity change, appetite change, chills, diaphoresis, fatigue, fever and unexpected weight change.  HENT: Negative for congestion and hearing loss.   Eyes: Negative for visual disturbance.  Respiratory: Negative for cough, chest tightness, shortness of breath and wheezing.   Cardiovascular: Negative for chest pain, palpitations and leg swelling.  Gastrointestinal: Negative for abdominal pain, constipation, diarrhea, nausea and vomiting.  Endocrine: Negative for cold intolerance and polyuria.  Genitourinary: Negative for decreased urine volume, difficulty urinating, dysuria, frequency and hematuria.  Musculoskeletal: Negative for arthralgias and neck pain.  Skin: Negative for rash.  Allergic/Immunologic: Negative for environmental allergies.  Neurological: Negative for dizziness, weakness, light-headedness, numbness and headaches.  Hematological: Negative for adenopathy.  Psychiatric/Behavioral: Negative for behavioral problems, dysphoric mood and sleep disturbance. The patient is not nervous/anxious.    Per HPI unless specifically indicated above     Objective:    BP 108/69 (BP Location: Left Arm, Patient Position: Sitting, Cuff Size: Normal)   Pulse 68   Temp 98.2 F (36.8 C) (Oral)   Ht 5\' 6"  (1.676 m)   Wt 196 lb (88.9 kg)   BMI 31.64 kg/m   Wt Readings from Last 3 Encounters:  05/31/17 196 lb (88.9 kg)  04/26/17 197 lb (89.4 kg)  01/11/17 196 lb (88.9 kg)    Physical Exam  Constitutional: She is oriented to person, place,  and time. She appears well-developed and well-nourished. No distress.  Well-appearing, comfortable, cooperative, overweight  HENT:  Head: Normocephalic and atraumatic.  Mouth/Throat: Oropharynx is clear and moist.  Eyes: Conjunctivae and EOM are normal. Pupils are equal, round, and reactive to light. Right eye exhibits no discharge. Left eye exhibits no discharge.  Neck: Normal range of motion. Neck supple. No thyromegaly present.  Cardiovascular: Normal rate, regular rhythm, normal heart sounds and intact distal pulses.   No murmur heard. Pulmonary/Chest: Effort normal and breath sounds normal. No respiratory distress. She has no wheezes. She has no rales.  Abdominal: Soft. Bowel sounds are normal. She exhibits no distension and no mass. There is no tenderness.  Musculoskeletal: Normal range of motion. She exhibits no edema or tenderness.  Upper / Lower Extremities: - Normal muscle tone, strength bilateral upper extremities 5/5, lower extremities 5/5  Lymphadenopathy:    She has no cervical adenopathy.  Neurological: She is alert and oriented to person, place, and time.  Distal sensation intact to light touch all extremities  Skin: Skin is warm and dry. No rash  noted. She is not diaphoretic. No erythema.  Psychiatric: She has a normal mood and affect. Her behavior is normal.  Well groomed, good eye contact, normal speech and thoughts  Nursing note and vitals reviewed.    Results for orders placed or performed in visit on 04/26/17  Comprehensive metabolic panel  Result Value Ref Range   Glucose 78 65 - 99 mg/dL   BUN 18 8 - 27 mg/dL   Creatinine, Ser 0.97 0.57 - 1.00 mg/dL   GFR calc non Af Amer 61 >59 mL/min/1.73   GFR calc Af Amer 71 >59 mL/min/1.73   BUN/Creatinine Ratio 19 12 - 28   Sodium 141 134 - 144 mmol/L   Potassium 3.8 3.5 - 5.2 mmol/L   Chloride 97 96 - 106 mmol/L   CO2 25 20 - 29 mmol/L   Calcium 9.2 8.7 - 10.3 mg/dL   Total Protein 6.5 6.0 - 8.5 g/dL   Albumin 4.3  3.6 - 4.8 g/dL   Globulin, Total 2.2 1.5 - 4.5 g/dL   Albumin/Globulin Ratio 2.0 1.2 - 2.2   Bilirubin Total 0.5 0.0 - 1.2 mg/dL   Alkaline Phosphatase 100 39 - 117 IU/L   AST 24 0 - 40 IU/L   ALT 33 (H) 0 - 32 IU/L  Lipid panel  Result Value Ref Range   Cholesterol, Total 230 (H) 100 - 199 mg/dL   Triglycerides 114 0 - 149 mg/dL   HDL 62 >39 mg/dL   VLDL Cholesterol Cal 23 5 - 40 mg/dL   LDL Calculated 145 (H) 0 - 99 mg/dL   Chol/HDL Ratio 3.7 0.0 - 4.4 ratio  Hemoglobin A1c  Result Value Ref Range   Hgb A1c MFr Bld 5.6 4.8 - 5.6 %   Est. average glucose Bld gHb Est-mCnc 114 mg/dL  TSH  Result Value Ref Range   TSH 7.890 (H) 0.450 - 4.500 uIU/mL  CBC with Differential/Platelet  Result Value Ref Range   WBC 8.0 3.4 - 10.8 x10E3/uL   RBC 4.52 3.77 - 5.28 x10E6/uL   Hemoglobin 13.4 11.1 - 15.9 g/dL   Hematocrit 40.8 34.0 - 46.6 %   MCV 90 79 - 97 fL   MCH 29.6 26.6 - 33.0 pg   MCHC 32.8 31.5 - 35.7 g/dL   RDW 13.7 12.3 - 15.4 %   Platelets 261 150 - 379 x10E3/uL   Neutrophils 54 Not Estab. %   Lymphs 32 Not Estab. %   Monocytes 8 Not Estab. %   Eos 5 Not Estab. %   Basos 1 Not Estab. %   Neutrophils Absolute 4.4 1.4 - 7.0 x10E3/uL   Lymphocytes Absolute 2.6 0.7 - 3.1 x10E3/uL   Monocytes Absolute 0.6 0.1 - 0.9 x10E3/uL   EOS (ABSOLUTE) 0.4 0.0 - 0.4 x10E3/uL   Basophils Absolute 0.1 0.0 - 0.2 x10E3/uL   Immature Granulocytes 0 Not Estab. %   Immature Grans (Abs) 0.0 0.0 - 0.1 x10E3/uL      Assessment & Plan:   Problem List Items Addressed This Visit    Hypothyroidism    Clinically remains stable, recent elevated TSH >7 previously controlled, some fluctuating results Increased Levothyroxine before visit from 75 to 3mcg daily, new rx sent Re-check future lab ([facility]) TSH and Free T4 6 week to 3 months, follow-up      Relevant Orders   TSH   T4, free   Hypertension - Primary    Much improved control HTN on thiazide now - Home BP readings normal,  did not bring  sheet  No known complications    Plan:  1. Continue current BP regimen - Losartan 50mg  daily, HCTZ 12.5mg  daily 2. Encourage improved lifestyle - low sodium diet, start improving regular exercise 3. Less frequently but continue monitor BP outside office, bring readings to next visit, if persistently >140/90 or new symptoms notify office sooner 4. Follow-up 6 months - sooner 3 months if uncontrol BP      Relevant Medications   aspirin EC 81 MG tablet   Hyperlipidemia    Uncontrolled cholesterol with abnormal LDL >140 otherwise normal, some poor lifestyle Last lipid panel 05/2017 Calculated ASCVD 10 yr risk score 9.1% with uncontrolled BP > now improved BP risk is about 6.7%  Plan: 1. Discussion on ASCVD risk and prevention - agree to start ASA 81mg  daily for primary prevention 2. Offered statin therapy - concern with prior myalgia, consider for future to do low dose rosuvastatin intermittent dosing only 3. Encourage improved lifestyle - low carb/cholesterol, reduce portion size, continue improving regular exercise 4. Follow-up 6 months - future lipids yearly      Relevant Medications   aspirin EC 81 MG tablet    Other Visit Diagnoses    Screening for breast cancer       patient to call / schedule at [facility] imaging, ordered   Relevant Orders   MM DIGITAL SCREENING BILATERAL   Screening for osteoporosis       Relevant Orders   DG BONE DENSITY (DXA)   Need for shingles vaccine       Ordered Shingrix, never vaccinated before, series x 2 injections q 6 months, printed order   Relevant Medications   Zoster Vac Recomb Adjuvanted [facility]) injection   Postmenopausal estrogen deficiency       SCreening DEXA and Vit D future orders   Relevant Orders   DG BONE DENSITY (DXA)   VITAMIN D 25 Hydroxy (Vit-D Deficiency, Fractures)   Vitamin D deficiency       Relevant Orders   VITAMIN D 25 Hydroxy (Vit-D Deficiency, Fractures)      Meds ordered this encounter  Medications  .  DISCONTD: levothyroxine (SYNTHROID, LEVOTHROID) 75 MCG tablet    Sig: TAKE 1 TABLET (75 MCG TOTAL) BY MOUTH DAILY.    Refill:  3  . aspirin-acetaminophen-caffeine (EXCEDRIN MIGRAINE) 250-250-65 MG tablet    Sig: Take 2 tablets by mouth every 6 (six) hours as needed for headache.  Marland Kitchen aspirin EC 81 MG tablet    Sig: Take 1 tablet (81 mg total) by mouth daily.  Marland Kitchen Zoster Vac Recomb Adjuvanted [facility]) injection    Sig: Inject 0.5 mLs into the muscle every 6 (six) months. For series of 2 injections    Dispense:  0.5 mL    Refill:  1    Follow up plan: Return in about 6 months (around 11/30/2017) for HTN, Thyroid, Cholesterol.  Future [facility] orders printed and given to patient.  [name], [facility] 05/31/2017, 1:36 PM

## 2017-05-31 NOTE — Telephone Encounter (Signed)
Patient called back and would like to be referred to [facility].

## 2017-08-14 ENCOUNTER — Other Ambulatory Visit: Payer: Self-pay

## 2017-08-14 ENCOUNTER — Telehealth: Payer: Self-pay | Admitting: Family Medicine

## 2017-08-14 DIAGNOSIS — E785 Hyperlipidemia, unspecified: Secondary | ICD-10-CM

## 2017-08-14 MED ORDER — EZETIMIBE 10 MG PO TABS: 10.0000 mg | ORAL_TABLET | Freq: Every day | ORAL | 3 refills | Status: DC

## 2017-08-14 NOTE — Telephone Encounter (Signed)
Pt needs a refill on ezetimibe sent to [facility] in [place]

## 2017-08-14 NOTE — Telephone Encounter (Signed)
Refilled

## 2017-08-31 LAB — T4, FREE: FREE T4: 1.48 ng/dL (ref 0.82–1.77)

## 2017-08-31 LAB — TSH: TSH: 3.64 u[IU]/mL (ref 0.450–4.500)

## 2017-08-31 LAB — VITAMIN D 25 HYDROXY (VIT D DEFICIENCY, FRACTURES): VIT D 25 HYDROXY: 25.8 ng/mL — AB (ref 30.0–100.0)

## 2017-09-19 ENCOUNTER — Other Ambulatory Visit: Payer: Self-pay | Admitting: Family Medicine

## 2017-09-19 DIAGNOSIS — E034 Atrophy of thyroid (acquired): Secondary | ICD-10-CM

## 2017-10-15 ENCOUNTER — Ambulatory Visit
Admission: RE | Admit: 2017-10-15 | Discharge: 2017-10-15 | Disposition: A | Discharge status: Home / Self Care | Payer: 59 | Source: Ambulatory Visit | Attending: Family Medicine | Admitting: Family Medicine

## 2017-10-15 ENCOUNTER — Encounter: Payer: Self-pay | Admitting: Family Medicine

## 2017-10-15 DIAGNOSIS — Z13820 Encounter for screening for osteoporosis: Secondary | ICD-10-CM | POA: Insufficient documentation

## 2017-10-15 DIAGNOSIS — M8588 Other specified disorders of bone density and structure, other site: Secondary | ICD-10-CM | POA: Insufficient documentation

## 2017-10-15 DIAGNOSIS — J45909 Unspecified asthma, uncomplicated: Secondary | ICD-10-CM | POA: Diagnosis not present

## 2017-10-15 DIAGNOSIS — Z1231 Encounter for screening mammogram for malignant neoplasm of breast: Secondary | ICD-10-CM | POA: Diagnosis not present

## 2017-10-15 DIAGNOSIS — Z780 Asymptomatic menopausal state: Secondary | ICD-10-CM | POA: Insufficient documentation

## 2017-10-15 DIAGNOSIS — M8580 Other specified disorders of bone density and structure, unspecified site: Secondary | ICD-10-CM | POA: Insufficient documentation

## 2017-10-15 DIAGNOSIS — Z1239 Encounter for other screening for malignant neoplasm of breast: Secondary | ICD-10-CM

## 2017-10-15 DIAGNOSIS — M85851 Other specified disorders of bone density and structure, right thigh: Secondary | ICD-10-CM | POA: Diagnosis not present

## 2017-11-28 ENCOUNTER — Ambulatory Visit: Payer: Medicare Other | Admitting: Family Medicine

## 2017-12-20 ENCOUNTER — Encounter: Payer: Self-pay | Admitting: Family Medicine

## 2017-12-20 ENCOUNTER — Other Ambulatory Visit: Payer: Self-pay | Admitting: Family Medicine

## 2017-12-20 ENCOUNTER — Ambulatory Visit: Payer: Medicare Other | Admitting: Family Medicine

## 2017-12-20 VITALS — BP 119/77 | HR 67 | Temp 98.1°F | Resp 16 | Ht 66.0 in | Wt 196.0 lb

## 2017-12-20 DIAGNOSIS — Z124 Encounter for screening for malignant neoplasm of cervix: Secondary | ICD-10-CM | POA: Diagnosis not present

## 2017-12-20 DIAGNOSIS — E034 Atrophy of thyroid (acquired): Secondary | ICD-10-CM

## 2017-12-20 DIAGNOSIS — I10 Essential (primary) hypertension: Secondary | ICD-10-CM

## 2017-12-20 DIAGNOSIS — E559 Vitamin D deficiency, unspecified: Secondary | ICD-10-CM | POA: Diagnosis not present

## 2017-12-20 DIAGNOSIS — L989 Disorder of the skin and subcutaneous tissue, unspecified: Secondary | ICD-10-CM | POA: Diagnosis not present

## 2017-12-20 DIAGNOSIS — M8588 Other specified disorders of bone density and structure, other site: Secondary | ICD-10-CM

## 2017-12-20 DIAGNOSIS — R7309 Other abnormal glucose: Secondary | ICD-10-CM

## 2017-12-20 DIAGNOSIS — E782 Mixed hyperlipidemia: Secondary | ICD-10-CM

## 2017-12-20 MED ORDER — MUPIROCIN 2 % EX OINT: 1.0000 "application " | TOPICAL_OINTMENT | Freq: Two times a day (BID) | CUTANEOUS | 0 refills | Status: DC

## 2017-12-20 NOTE — Progress Notes (Signed)
Subjective:    Patient ID: Jillian Fritz, female    DOB: Apr 13, 1951, 67 y.o.   MRN: [id number]  Jillian Fritz is a 67 y.o. female presenting on 12/20/2017 for Hypertension; Hypothyroidism; and Hyperlipidemia   HPI   CHRONIC HTN: - Last visit with me 05/31/17, see prior notes for background information. - Today patient reports still overall doing well with BP, home readings 120s Current Meds - Losartan 50mg  daily, HCTZ 12.5mg  daily Tolerating well, w/o complaints. - Still improved edema Lifestyle: - Diet: No changes, not following particular diet - Exercise: No regular exercise, she plans to start some this year Denies CP, dyspnea, edema, dizziness / lightheadedness, fatigue  Hypothyroidism: - Last visit 05/2017, increased dose from 75 to 63mcg - Today doing well stable on higher dose now Levothyroxine 33mcg daily, no change since that time - Last lab was 08/2017 - Admits unable to lose much weight, but has not aggressively tried lifestyle changes - Denies temperature changes, hair or nail changes  Osteopenia, spine / Vitamin D Deficiency Recent routine DEXA screening 10/2017, showed T score -2.0 in spine, new diagnosis at that time - No history of fracture. She is taking Vitamin D and Calcium OTC supplement - She had history of vit d def in past, was treated with vitamin D 5k daily but has continued this longer than 3 months - Denies fall injury or fracture  Left Middle Finger - Reports new problem onset about 8 months ago with small 1 cm raised swollen nodule or cyst like spot on lateral edge of Left middle finger DIP, had recurrent opening and drainage with clear fluid and continued scab-like - Previous Dermatologist, back in [place], she has not been in several years, but would consider going back. No known history of skin cancer or abnormal lesions. She had been checked for moles in past. - Denies fever chills redness drainage of pus other lesion  Health  Maintenance: Due routine pap smear testing - she attempted to schedule with [facility] locally, but was unable to get this arranged. She is requesting referral now to proceed with this to establish care with them for pap screening.  Breast CA Screening: UTD. Last mammogram result negative bi-rads 1 10/15/17 done at [facility]. No prior history abnormal mammogram. Known family history of breast cancer, paternal aunt x 3. Currently asymptomatic.     Depression screen [facility] 2/9 04/26/2017 01/09/2016  Decreased Interest 0 0  Down, Depressed, Hopeless 0 0  PHQ - 2 Score 0 0    Social History   Tobacco Use  . Smoking status: Never Smoker  . Smokeless tobacco: Never Used  Substance Use Topics  . Alcohol use: No    Alcohol/week: 0.0 oz  . Drug use: No    Review of Systems Per HPI unless specifically indicated above     Objective:    BP 119/77   Pulse 67   Temp 98.1 F (36.7 C) (Oral)   Resp 16   Ht 5\' 6"  (1.676 m)   Wt 196 lb (88.9 kg)   BMI 31.64 kg/m   Wt Readings from Last 3 Encounters:  12/20/17 196 lb (88.9 kg)  05/31/17 196 lb (88.9 kg)  04/26/17 197 lb (89.4 kg)    Physical Exam  Constitutional: She is oriented to person, place, and time. She appears well-developed and well-nourished. No distress.  Well-appearing, comfortable, cooperative, overweight  HENT:  Head: Normocephalic and atraumatic.  Mouth/Throat: Oropharynx is clear and moist.  Eyes:  Conjunctivae are normal. Right eye exhibits no discharge. Left eye exhibits no discharge.  Neck: Normal range of motion. Neck supple. No thyromegaly present.  Cardiovascular: Normal rate, regular rhythm, normal heart sounds and intact distal pulses.  No murmur heard. Pulmonary/Chest: Effort normal and breath sounds normal. No respiratory distress. She has no wheezes. She has no rales.  Musculoskeletal: Normal range of motion. She exhibits no edema.  Lymphadenopathy:    She has no cervical adenopathy.  Neurological: She is  alert and oriented to person, place, and time.  Skin: Skin is warm and dry. No rash noted. She is not diaphoretic. No erythema.  Left middle finger DIP - Cystlike 1 cm lesion with superficial ulcerated healing scab on top, no erythema, non tender, slightly firm but fluctuant, no drainage, non mobile  Psychiatric: She has a normal mood and affect. Her behavior is normal.  Well groomed, good eye contact, normal speech and thoughts  Nursing note and vitals reviewed.    Left Middle Finger   I have personally reviewed the radiology report from DEXA 10/15/17.  EXAM: DUAL X-RAY ABSORPTIOMETRY (DXA) FOR BONE MINERAL DENSITY  IMPRESSION: Dear Dr [name],  Your patient Jillian Fritz completed a BMD test on 10/15/2017 using the [place] (analysis version: 14.10) manufactured by [employer]. The following summarizes the results of our evaluation. PATIENT BIOGRAPHICAL: Name: Fritz, Jillian Patient ID: [id number] Birth Date: 05-26-1951 Height: 64.5 in. Gender: Female Exam Date: 10/15/2017 Weight: 198.0 lbs. Indications: Advanced Age, arthritis, asthma, Caucasian, Height Loss, POSTmenopausal, Glucocorticoids (Chronic) Fractures: Treatments: 81 MG ASPIRIN, levothyroxine, singulair, Vitamin D  ASSESSMENT: The BMD measured at AP Spine L1-L4 (L3) is 0.935 g/cm2 with a T-score of -2.0. This patient is considered osteopenic according to [facility]) criteria. L3 was excluded due to degenerative changes.  Site Region Measured Measured WHO Young Adult BMD Date       Age      Classification T-score AP Spine L1-L4 (L3) 10/15/2017 65.5 Osteopenia -2.0 0.935 g/cm2  DualFemur Total Right 10/15/2017 65.5 Osteopenia -1.3 0.841 g/cm2  World Health Organization [facility]) criteria for post-menopausal, Caucasian Women: Normal:       T-score at or above -1 SD Osteopenia:   T-score between -1 and -2.5 SD Osteoporosis: T-score at or below  -2.5 SD RECOMMENDATIONS:  [place] recommends that FDA-approved medical therapies be considered in postemenopausal women and men age 65 or older with a:  1. Hip or vertebral (clinical or morphometric) fracture. 2. T-score of < -2.5at the spine or hip. 3. Ten-year fracture probability by FRAX of 3% or greater for hip fracture or 20% or greater for major osteoporotic fracture.  All treatment decisions require clinical judgment and consideration of individual patient factors, including patient preferences, co-morbidities, previous drug use, risk factors not captured in the FRAX model (e.g. falls, vitamin D deficiency, increased bone turnover, interval significant decline in bone density) and possible under - or over-estimation of fracture risk by FRAX.  All patients should ensure an adequate intake of dietary calcium (1200 mg/d) and vitamin D (800 IU daily) unless contraindicated. FOLLOW-UP: People with diagnosed cases of osteoporosis or at high risk for fracture should have regular bone mineral density tests. For patients eligible for Medicare, routine testing is allowed once every 2 years. The testing frequency can be increased to one year for patients who have rapidly progressing disease, those who are receiving or discontinuing medical therapy to restore bone mass, or have additional risk factors.  I have reviewed this report, and agree with the above findings.  [facility] Radiology Dear Dr [name],  Your patient CORINN STOLTZFUS completed a FRAX assessment on 10/15/2017 using the [place] (analysis version: 14.10) manufactured by [employer]. The following summarizes the results of our evaluation.  PATIENT BIOGRAPHICAL: Name: Tiare, Rohlman Patient ID: [id number] Birth Date: [birth year] Height:    64.5 in. Gender:     Female    [age]       Weight:    198.0 lbs. Ethnicity:  White                             Exam Date: 10/15/2017  FRAX* RESULTS:  (version: 3.5) 10-year Probability of Fracture1 Major Osteoporotic Fracture2 Hip Fracture 11.0% 0.6% Population: [place] (Caucasian) Risk Factors: Glucocorticoids (Chronic)  Based on Femur (Left) Neck BMD  1 -The 10-year probability of fracture may be lower than reported if the patient has received treatment. 2 -Major Osteoporotic Fracture: Clinical Spine, Forearm, Hip or Shoulder  *FRAX is a Materials engineer of the [employer] for Metabolic Bone Disease, a [place] (WHO) [facility].  ASSESSMENT: The probability of a major osteoporotic fracture is 11.0% within the next ten years.  The probability of a hip fracture is 0.6% within the next ten years.   Electronically Signed   By: Rolm Baptise M.D.   On: 10/15/2017 09:47  Results for orders placed or performed in visit on 05/31/17  TSH  Result Value Ref Range   TSH 3.640 0.450 - 4.500 uIU/mL  T4, free  Result Value Ref Range   Free T4 1.48 0.82 - 1.77 ng/dL  VITAMIN D 25 Hydroxy (Vit-D Deficiency, Fractures)  Result Value Ref Range   Vit D, 25-Hydroxy 25.8 (L) 30.0 - 100.0 ng/mL      Assessment & Plan:   Problem List Items Addressed This Visit    Hypertension - Primary    Well-controlled HTN now on thiazide - Home BP readings improved control  No known complications     Plan:  1. Continue current BP regimen - Losartan 50mg  daily and HCTZ 12.5mg  daily 2. Encourage improved lifestyle - low sodium diet, regular exercise 3. Continue monitor BP outside office, bring readings to next visit, if persistently >140/90 or new symptoms notify office sooner 4. Follow-up 5 months, labs / physical      Hypothyroidism    Improved control clinically Last TSH trend 7.8 > 3.6 (08/2017) improved on Levothyroxine 80mcg Continue levothyroxine 39mcg daily for now Due for repeat labs 5 months with annual,  [facility] orders      Osteopenia    New diagnosis on DEXA 10/2017, T-2.0 in spine/femur No prior history osteopenia, OP or any fractures Reviewed diagnosis and risks with higher fracture risk Recommend continue current Calcium / Vitamin D (now reduce to 2k daily maintenance, off 5k) Start more weight training exercise if possible Consider Alendronate bisphosphonate at lower preventative dosing, handout given to consider Follow-up 5 months review med options Next DEXA due 2-3 years      Vitamin D deficiency    Low Vitamin D previously 08/2017 On Vitamin D3 5k daily since that time, completed therapy Reduce to OTC Vitamin D3 2,000 iu daily for maintenance        Other Visit Diagnoses    Non-healing skin lesion  Finger, seems to be cyst vs callus like, but non healing, ulceration Concern for possible precancerous lesion, seems to have unusual behavior and location Trial on topical antibiotic mupirocin avoid 2ndary infection and help heal Advised that she should schedule with her existing Dermatologist for re-evaluation    Relevant Medications   mupirocin ointment (BACTROBAN) 2 %   Screening for cervical cancer     Referral to [facility] locally for routine pap smear and establish care     Relevant Orders   Ambulatory referral to Obstetrics / Gynecology      Meds ordered this encounter  Medications  . mupirocin ointment (BACTROBAN) 2 %    Sig: Apply 1 application topically 2 (two) times daily. For 1-2 weeks, then stop.    Dispense:  22 g    Refill:  0   Orders Placed This Encounter  Procedures  . Ambulatory referral to Obstetrics / Gynecology    Referral Priority:   Routine    Referral Type:   Consultation    Referral Reason:   Specialty Services Required    Requested Specialty:   Obstetrics and Gynecology    Number of Visits Requested:   1    Follow up plan: Return in about 5 months (around 05/20/2018) for Annual Physical.  Future [facility] labs  - will release when she notifies [place] 05/2018  [name], [facility] 12/20/2017, 1:02 PM

## 2017-12-20 NOTE — Patient Instructions (Addendum)
Thank you for coming to the office today.  1. Keep working on lifestyle changes and regular exercise as discussed  2. BP is well controlled, no med changes, meds will run out in May or June, let me know when ready for refill  3. Osteopenia on DEXA bone mineral density - consider Alendronate medicine low dose preventative, next repeat DEXA in 2-3 years to re-check  Vitamin D reduce to 2,000 iu daily for maintenance  Continue Calcium  Consider weight bearing exercise to strength bones  ---------------------------------------  Referral to [facility] for routine pap smear and establish care   3. For finger skin spot - I am concerned that it does not heal and keeps opening and scabbing. I would have it checked to rule out a pre cancerous or skin cancer  [place]   [place], [place] [address] Hours: 8AM-5PM Phone: [phone]  [place] - Dr. Ree Edman   [address] [place], [place] [address] Phone: [phone]  DUE for FASTING BLOOD WORK (no food or drink after midnight before the lab appointment, only water or coffee without cream/sugar on the morning of)  ORDERS PLACED FOR [facility] - CONTACT us 1 week before apt in approximately 5 to 5.5 MONTHS   - Make sure Lab Only appointment is at about 1 week before your next appointment, so that results will be available  For Lab Results, once available within 2-3 days of blood draw, you can can log in to MyChart online to view your results and a brief explanation. Also, we can discuss results at next follow-up visit.  Please schedule a Follow-up Appointment to: Return in about 5 months (around 05/20/2018) for Annual Physical.    If you have any other questions or concerns, please feel free to call the office or send a message through [place]. You may also schedule an earlier appointment if necessary.  Additionally, you may be receiving a  survey about your experience at our office within a few days to 1 week by e-mail or mail. We value your feedback.  [name], DO [place]

## 2017-12-20 NOTE — Assessment & Plan Note (Signed)
Well-controlled HTN now on thiazide - Home BP readings improved control  No known complications     Plan:  1. Continue current BP regimen - Losartan 50mg  daily and HCTZ 12.5mg  daily 2. Encourage improved lifestyle - low sodium diet, regular exercise 3. Continue monitor BP outside office, bring readings to next visit, if persistently >140/90 or new symptoms notify office sooner 4. Follow-up 5 months, labs / physical

## 2017-12-20 NOTE — Assessment & Plan Note (Signed)
Improved control clinically Last TSH trend 7.8 > 3.6 (08/2017) improved on Levothyroxine 52mcg Continue levothyroxine 11mcg daily for now Due for repeat labs 5 months with annual, [facility] orders

## 2017-12-20 NOTE — Assessment & Plan Note (Signed)
New diagnosis on DEXA 10/2017, T-2.0 in spine/femur No prior history osteopenia, OP or any fractures Reviewed diagnosis and risks with higher fracture risk Recommend continue current Calcium / Vitamin D (now reduce to 2k daily maintenance, off 5k) Start more weight training exercise if possible Consider Alendronate bisphosphonate at lower preventative dosing, handout given to consider Follow-up 5 months review med options Next DEXA due 2-3 years

## 2017-12-20 NOTE — Assessment & Plan Note (Signed)
Low Vitamin D previously 08/2017 On Vitamin D3 5k daily since that time, completed therapy Reduce to OTC Vitamin D3 2,000 iu daily for maintenance

## 2018-01-16 ENCOUNTER — Other Ambulatory Visit: Payer: Self-pay

## 2018-01-16 DIAGNOSIS — K219 Gastro-esophageal reflux disease without esophagitis: Secondary | ICD-10-CM

## 2018-01-16 DIAGNOSIS — K222 Esophageal obstruction: Principal | ICD-10-CM

## 2018-01-16 MED ORDER — PANTOPRAZOLE SODIUM 40 MG PO TBEC: 40.0000 mg | DELAYED_RELEASE_TABLET | Freq: Every day | ORAL | 3 refills | Status: DC

## 2018-05-23 ENCOUNTER — Encounter: Payer: Medicare Other | Admitting: Family Medicine

## 2018-07-03 ENCOUNTER — Other Ambulatory Visit: Payer: Self-pay | Admitting: Family Medicine

## 2018-07-03 DIAGNOSIS — I10 Essential (primary) hypertension: Secondary | ICD-10-CM

## 2018-07-16 ENCOUNTER — Other Ambulatory Visit: Payer: Self-pay | Admitting: Family Medicine

## 2018-07-16 DIAGNOSIS — K219 Gastro-esophageal reflux disease without esophagitis: Secondary | ICD-10-CM

## 2018-07-16 DIAGNOSIS — K222 Esophageal obstruction: Principal | ICD-10-CM

## 2018-07-22 ENCOUNTER — Other Ambulatory Visit: Payer: Self-pay

## 2018-07-22 DIAGNOSIS — E559 Vitamin D deficiency, unspecified: Secondary | ICD-10-CM

## 2018-07-22 DIAGNOSIS — I10 Essential (primary) hypertension: Secondary | ICD-10-CM

## 2018-07-22 DIAGNOSIS — E034 Atrophy of thyroid (acquired): Secondary | ICD-10-CM

## 2018-07-22 DIAGNOSIS — M8588 Other specified disorders of bone density and structure, other site: Secondary | ICD-10-CM

## 2018-07-22 DIAGNOSIS — R7309 Other abnormal glucose: Secondary | ICD-10-CM

## 2018-07-22 DIAGNOSIS — E782 Mixed hyperlipidemia: Secondary | ICD-10-CM

## 2018-07-24 LAB — COMPREHENSIVE METABOLIC PANEL
ALBUMIN: 4.4 g/dL (ref 3.6–4.8)
ALT: 20 IU/L (ref 0–32)
AST: 18 IU/L (ref 0–40)
Albumin/Globulin Ratio: 2.1 (ref 1.2–2.2)
Alkaline Phosphatase: 72 IU/L (ref 39–117)
BUN / CREAT RATIO: 14 (ref 12–28)
BUN: 15 mg/dL (ref 8–27)
Bilirubin Total: 0.6 mg/dL (ref 0.0–1.2)
CO2: 23 mmol/L (ref 20–29)
CREATININE: 1.05 mg/dL — AB (ref 0.57–1.00)
Calcium: 9.3 mg/dL (ref 8.7–10.3)
Chloride: 102 mmol/L (ref 96–106)
GFR calc non Af Amer: 55 mL/min/{1.73_m2} — ABNORMAL LOW (ref >59)
GFR, EST AFRICAN AMERICAN: 64 mL/min/{1.73_m2} (ref >59)
GLUCOSE: 92 mg/dL (ref 65–99)
Globulin, Total: 2.1 g/dL (ref 1.5–4.5)
Potassium: 4 mmol/L (ref 3.5–5.2)
Sodium: 142 mmol/L (ref 134–144)
TOTAL PROTEIN: 6.5 g/dL (ref 6.0–8.5)

## 2018-07-24 LAB — LIPID PANEL
CHOL/HDL RATIO: 4 ratio (ref 0.0–4.4)
Cholesterol, Total: 258 mg/dL — ABNORMAL HIGH (ref 100–199)
HDL: 64 mg/dL (ref >39)
LDL Calculated: 172 mg/dL — ABNORMAL HIGH (ref 0–99)
Triglycerides: 111 mg/dL (ref 0–149)
VLDL Cholesterol Cal: 22 mg/dL (ref 5–40)

## 2018-07-24 LAB — CBC WITH DIFFERENTIAL/PLATELET
BASOS: 1 %
Basophils Absolute: 0.1 10*3/uL (ref 0.0–0.2)
EOS (ABSOLUTE): 0.4 10*3/uL (ref 0.0–0.4)
Eos: 7 %
Hematocrit: 38.8 % (ref 34.0–46.6)
Hemoglobin: 12.4 g/dL (ref 11.1–15.9)
Immature Grans (Abs): 0 10*3/uL (ref 0.0–0.1)
Immature Granulocytes: 0 %
LYMPHS ABS: 2.7 10*3/uL (ref 0.7–3.1)
Lymphs: 40 %
MCH: 28.8 pg (ref 26.6–33.0)
MCHC: 32 g/dL (ref 31.5–35.7)
MCV: 90 fL (ref 79–97)
MONOS ABS: 0.5 10*3/uL (ref 0.1–0.9)
Monocytes: 7 %
NEUTROS ABS: 3.1 10*3/uL (ref 1.4–7.0)
NEUTROS PCT: 45 %
Platelets: 226 10*3/uL (ref 150–450)
RBC: 4.3 x10E6/uL (ref 3.77–5.28)
RDW: 14.7 % (ref 12.3–15.4)
WBC: 6.8 10*3/uL (ref 3.4–10.8)

## 2018-07-24 LAB — VITAMIN D 25 HYDROXY (VIT D DEFICIENCY, FRACTURES): VIT D 25 HYDROXY: 43.2 ng/mL (ref 30.0–100.0)

## 2018-07-24 LAB — T4, FREE: Free T4: 1.19 ng/dL (ref 0.82–1.77)

## 2018-07-24 LAB — HEMOGLOBIN A1C
ESTIMATED AVERAGE GLUCOSE: 114 mg/dL
Hgb A1c MFr Bld: 5.6 % (ref 4.8–5.6)

## 2018-07-24 LAB — TSH: TSH: 5.29 u[IU]/mL — ABNORMAL HIGH (ref 0.450–4.500)

## 2018-07-25 ENCOUNTER — Encounter: Payer: Self-pay | Admitting: Family Medicine

## 2018-07-25 DIAGNOSIS — N182 Chronic kidney disease, stage 2 (mild): Secondary | ICD-10-CM | POA: Insufficient documentation

## 2018-07-25 DIAGNOSIS — R7309 Other abnormal glucose: Secondary | ICD-10-CM | POA: Insufficient documentation

## 2018-07-30 ENCOUNTER — Encounter: Payer: 59 | Admitting: Family Medicine

## 2018-07-30 ENCOUNTER — Other Ambulatory Visit: Payer: Self-pay | Admitting: Nurse Practitioner

## 2018-07-30 ENCOUNTER — Encounter: Payer: Self-pay | Admitting: Family Medicine

## 2018-07-30 ENCOUNTER — Ambulatory Visit (INDEPENDENT_AMBULATORY_CARE_PROVIDER_SITE_OTHER): Payer: 59 | Admitting: Family Medicine

## 2018-07-30 ENCOUNTER — Other Ambulatory Visit: Payer: Self-pay | Admitting: Family Medicine

## 2018-07-30 VITALS — BP 135/77 | HR 61 | Temp 97.9°F | Resp 16 | Ht 66.0 in | Wt 192.6 lb

## 2018-07-30 DIAGNOSIS — E034 Atrophy of thyroid (acquired): Secondary | ICD-10-CM

## 2018-07-30 DIAGNOSIS — Z23 Encounter for immunization: Secondary | ICD-10-CM | POA: Diagnosis not present

## 2018-07-30 DIAGNOSIS — I129 Hypertensive chronic kidney disease with stage 1 through stage 4 chronic kidney disease, or unspecified chronic kidney disease: Secondary | ICD-10-CM

## 2018-07-30 DIAGNOSIS — N183 Chronic kidney disease, stage 3 unspecified: Secondary | ICD-10-CM

## 2018-07-30 DIAGNOSIS — E559 Vitamin D deficiency, unspecified: Secondary | ICD-10-CM

## 2018-07-30 DIAGNOSIS — E782 Mixed hyperlipidemia: Secondary | ICD-10-CM

## 2018-07-30 DIAGNOSIS — R7309 Other abnormal glucose: Secondary | ICD-10-CM

## 2018-07-30 DIAGNOSIS — I10 Essential (primary) hypertension: Secondary | ICD-10-CM

## 2018-07-30 DIAGNOSIS — R001 Bradycardia, unspecified: Secondary | ICD-10-CM

## 2018-07-30 DIAGNOSIS — Z0000 Encounter for general adult medical examination without abnormal findings: Secondary | ICD-10-CM | POA: Diagnosis not present

## 2018-07-30 MED ORDER — LOSARTAN POTASSIUM 50 MG PO TABS: 50.0000 mg | ORAL_TABLET | Freq: Every day | ORAL | 3 refills | Status: DC

## 2018-07-30 MED ORDER — ROSUVASTATIN CALCIUM 5 MG PO TABS: 5.0000 mg | ORAL_TABLET | ORAL | 1 refills | Status: DC

## 2018-07-30 MED ORDER — LEVOTHYROXINE SODIUM 100 MCG PO TABS: 100.0000 ug | ORAL_TABLET | Freq: Every day | ORAL | 3 refills | Status: DC

## 2018-07-30 NOTE — Assessment & Plan Note (Signed)
Well-controlled HTN - Home BP readings improved control  No known complications     Plan:  1. Continue current BP regimen - Losartan 50mg  daily and HCTZ 12.5mg  daily 2. Encourage improved lifestyle - low sodium diet, regular exercise 3. Continue monitor BP outside office, bring readings to next visit, if persistently >140/90 or new symptoms notify office sooner 4. Follow-up 6 months

## 2018-07-30 NOTE — Telephone Encounter (Signed)
Pt needs a refill on lorsartan.

## 2018-07-30 NOTE — Assessment & Plan Note (Signed)
Improved Vitamin D Continue supplement

## 2018-07-30 NOTE — Assessment & Plan Note (Addendum)
Mild elevated A1c 5.6, unchanged. Not dx PreDM Concern with obesity, HTN, HLD  Plan:  1. Not on any therapy currently  2. Encourage improved lifestyle - low carb, low sugar diet, reduce portion size, continue improving regular exercise - handout given low glycemic diet 3. Follow-up q 6 mo

## 2018-07-30 NOTE — Assessment & Plan Note (Signed)
Clinically with some generalized non specific symptoms, question if actually related to reduced heart rate bradycardia. Not induced by medication it seems. - Suspect may be due to hypothyroidism or other factors such as dietary/lifestyle reduced caffeine  Plan Monitor HR and vitals, and symptoms for now Follow-up sooner if needed or return criteria given, if not improve on adjusted dose levothyroxine can consider future cardiology referral

## 2018-07-30 NOTE — Assessment & Plan Note (Signed)
Stable CKD-III with GFR < 60 At baseline Cr Not on NSAIDs Improve hydration Continue current med regimen, control HTN Follow-up q 6 mo

## 2018-07-30 NOTE — Assessment & Plan Note (Signed)
Elevated TSH, mildly >5 Clinically some concerns for fatigue reduced energy  Plan Increase Levothyroxine from 88 up to 163mcg daily - sent new rx Follow-up repeat labs in 6 months as planned

## 2018-07-30 NOTE — Patient Instructions (Addendum)
Thank you for coming to the office today.  Approximately around October or November 2019 You get a copy of record from work - fax or drop it off at our office FLU SHOT  Today will get Pneumovax-23 2nd and final pneumonia vaccine  Increase Levothyroxine from 70 up to 158mcg - sent new rx to [facility]  You are at increased risk of future Cardiovascular complications such as Heart Attack or Stroke from an artery blockage due to abnormal cholesterol and/or risk factors. - As discussed, Statin Cholesterol pills both can both LOWER cholesterol and REDUCE this future risk of heart attack and stroke - Start Rosuvastatin (generic Crestor) 5mg  pill once at bedtime TWICE A WEEK  If you develop mild aches or pains in muscle or joint that does NOT improve or go away after first 3-4 weeks then this may require [place] to adjust the dose. First I would recommend STOPPING the medication for a few weeks until your ache and pain symptoms completely RESOLVE. Then you can restart at LESS OFTEN such as one pill a week only  Lastly, sometimes we need to try other versions of this medicine to find one that works for you and does not cause side effects.  Leg cramps - Try spoonful of yellow mustard to relieve leg cramps or try daily to prevent the problem  - OTC natural option is Hyland's Leg Cramps (Dissolving tablet) take as needed for muscle cramps   DUE for FASTING BLOOD WORK (no food or drink after midnight before the lab appointment, only water or coffee without cream/sugar on the morning of)  [facility] - call us to pick up or have [place] send them the orders - in 6 months - February / March 2020  - Make sure Lab Only appointment is at about 1 week before your next appointment, so that results will be available  For Lab Results, once available within 2-3 days of blood draw, you can can log in to MyChart online to view your results and a brief explanation. Also, we can discuss results at next follow-up  visit.   Please schedule a Follow-up Appointment to: Return in about 6 months (around 01/30/2019) for F/u Lab results - sugar, Thyroid, Cholesterol - med adjust.  If you have any other questions or concerns, please feel free to call the office or send a message through [place]. You may also schedule an earlier appointment if necessary.  Additionally, you may be receiving a survey about your experience at our office within a few days to 1 week by e-mail or mail. We value your feedback.  [name], DO [place]

## 2018-07-30 NOTE — Progress Notes (Signed)
Subjective:    Patient ID: Jillian Fritz, female    DOB: Nov 23, 1951, 67 y.o.   MRN: [id number]  Jillian Fritz is a 67 y.o. female presenting on 07/30/2018 for Annual Exam   HPI   Here for Annual Physical and Lab Review.  CHRONIC HTN with CKD-III Last lab showed mildly elevated Cr to 1.05, but still similar to prior readings. - Today patient reportsstill overall doing well with home BP readings controlled Current Meds - Losartan 50mg  daily, HCTZ 12.5mg  daily Tolerating well, w/o complaints. - Still improved edema Not taking NSAIDs Lifestyle: - Diet: No changes, not following particular diet - Exercise: No regular exercise still but stays active  Elevated A1c Mild elevated A1c 5.6, unchanged in 1 year. Meds: not on meds Currently on ARB  HYPERLIPIDEMIA: - Reports concerns with still elevated cholesterol, prior statin intolerance, failed lipitor and simvastatin in past due to swelling reaction. Last lipid panel 07/2018, elevated LDL. - Currently taking Zetia 10mg , tolerating well without side effects or myalgias  Bradycardia Reports persistent problem for while, has symptoms of tired and fatigue, has monitored pulse at home states on meter reads avg 50s and high of low 60s - Diet she has reduced coffee / caffeine thinks this may be part of it - Denies any syncope, presyncope, dizziness, lightheadedness  Hypothyroidism: Previously inc dose from 75 up to 88 previously. Now last lab shows elevated TSH again to 5.290. She admits fatigue as above and feeling reduced energy - Weight down 3-5 lbs  Osteopenia, spine / Vitamin D Deficiency Improved Vitamin D on last lab. Taking supplement.   Health Maintenance: Due for Flu vaccine, will return or send [place] copy of report.  Due for 2nd pneumonia vaccine >1 year after previous vaccine, now to receive Pneumovax-23 today  Breast CA Screening: UTD. Last mammogram result negative bi-rads 1 10/15/17 done at [facility]. No  prior history abnormal mammogram. Known family history of breast cancer, paternal aunt x 3. Currently asymptomatic.   Depression screen [facility] 2/9 07/30/2018 04/26/2017 01/09/2016  Decreased Interest 0 0 0  Down, Depressed, Hopeless 0 0 0  PHQ - 2 Score 0 0 0    Past Medical History:  Diagnosis Date  . Asthma   . GERD (gastroesophageal reflux disease)   . Heart murmur   . Heart murmur    states she has 2 murmurs  . Hypertension   . Hypothyroidism   . Migraines   . Obesity   . Shortness of breath dyspnea    Past Surgical History:  Procedure Laterality Date  . CERVICAL CONE BIOPSY    . ESOPHAGOGASTRODUODENOSCOPY (EGD) WITH PROPOFOL N/A 03/23/2016   Procedure: ESOPHAGOGASTRODUODENOSCOPY (EGD) WITH esophageal dilation. ;  Surgeon: [name], MD;  Location: [place];  Service: Endoscopy;  Laterality: N/A;  . THROAT SURGERY  04/2014   Social History   Socioeconomic History  . Marital status: Single    Spouse name: Not on file  . Number of children: Not on file  . Years of education: Not on file  . Highest education level: Not on file  Occupational History  . Occupation: Air cabin crew  Social Needs  . Financial resource strain: Not on file  . Food insecurity:    Worry: Not on file    Inability: Not on file  . Transportation needs:    Medical: Not on file    Non-medical: Not on file  Tobacco Use  . Smoking status: Never Smoker  . Smokeless tobacco:  Never Used  Substance and Sexual Activity  . Alcohol use: No    Alcohol/week: 0.0 standard drinks  . Drug use: No  . Sexual activity: Not on file  Lifestyle  . Physical activity:    Days per week: Not on file    Minutes per session: Not on file  . Stress: Not on file  Relationships  . Social connections:    Talks on phone: Not on file    Gets together: Not on file    Attends religious service: Not on file    Active member of club or organization: Not on file    Attends meetings of clubs or  organizations: Not on file    Relationship status: Not on file  . Intimate partner violence:    Fear of current or ex partner: Not on file    Emotionally abused: Not on file    Physically abused: Not on file    Forced sexual activity: Not on file  Other Topics Concern  . Not on file  Social History Narrative  . Not on file   Family History  Problem Relation Age of Onset  . Alzheimer's disease Father   . Hypertension Mother   . Breast cancer Paternal Aunt        3 pat aunts   Current Outpatient Medications on File Prior to Visit  Medication Sig  . albuterol (PROVENTIL HFA;VENTOLIN HFA) 108 (90 Base) MCG/ACT inhaler Inhale 2 puffs into the lungs every 4 (four) hours as needed for wheezing or shortness of breath (cough).  Marland Kitchen aspirin EC 81 MG tablet Take 1 tablet (81 mg total) by mouth daily.  Marland Kitchen aspirin-acetaminophen-caffeine (EXCEDRIN MIGRAINE) 250-250-65 MG tablet Take 2 tablets by mouth every 6 (six) hours as needed for headache.  . ezetimibe (ZETIA) 10 MG tablet Take 1 tablet (10 mg total) by mouth daily.  . fluticasone (FLONASE) 50 MCG/ACT nasal spray PLACE 2 SPRAYS INTO BOTH NOSTRILS DAILY.  . hydrochlorothiazide (MICROZIDE) 12.5 MG capsule TAKE 1 CAPSULE BY MOUTH EVERY DAY  . mupirocin ointment (BACTROBAN) 2 % Apply 1 application topically 2 (two) times daily. For 1-2 weeks, then stop.  . pantoprazole (PROTONIX) 40 MG tablet TAKE 1 TABLET DAILY  . SINGULAIR 10 MG tablet Take 1 tablet (10 mg total) by mouth at bedtime.  Marland Kitchen Zoster Vac Recomb Adjuvanted [facility] injection Inject 0.5 mLs into the muscle every 6 (six) months. For series of 2 injections   No current facility-administered medications on file prior to visit.     Review of Systems  Constitutional: Positive for fatigue. Negative for activity change, appetite change, chills, diaphoresis and fever.  HENT: Negative for congestion and hearing loss.   Eyes: Negative for visual disturbance.  Respiratory: Negative for apnea,  cough, chest tightness, shortness of breath and wheezing.   Cardiovascular: Negative for chest pain, palpitations and leg swelling.  Gastrointestinal: Negative for abdominal pain, anal bleeding, blood in stool, constipation, diarrhea, nausea and vomiting.  Endocrine: Negative for cold intolerance.  Genitourinary: Negative for difficulty urinating, dysuria, frequency and hematuria.  Musculoskeletal: Negative for arthralgias, back pain and neck pain.  Skin: Negative for rash.  Allergic/Immunologic: Negative for environmental allergies.  Neurological: Negative for dizziness, weakness, light-headedness, numbness and headaches.  Hematological: Negative for adenopathy.  Psychiatric/Behavioral: Negative for behavioral problems, dysphoric mood and sleep disturbance. The patient is not nervous/anxious.    Per HPI unless specifically indicated above     Objective:    BP 135/77   Pulse 61  Temp 97.9 F (36.6 C) (Oral)   Resp 16   Ht 5\' 6"  (1.676 m)   Wt 192 lb 9.6 oz (87.4 kg)   BMI 31.09 kg/m   Wt Readings from Last 3 Encounters:  07/30/18 192 lb 9.6 oz (87.4 kg)  12/20/17 196 lb (88.9 kg)  05/31/17 196 lb (88.9 kg)    Physical Exam  Constitutional: She is oriented to person, place, and time. She appears well-developed and well-nourished. No distress.  Well-appearing, comfortable, cooperative  HENT:  Head: Normocephalic and atraumatic.  Mouth/Throat: Oropharynx is clear and moist.  Frontal / maxillary sinuses non-tender. Nares patent without purulence or edema. Bilateral TMs clear without erythema, effusion or bulging. Oropharynx clear without erythema, exudates, edema or asymmetry.  Eyes: Pupils are equal, round, and reactive to light. Conjunctivae and EOM are normal. Right eye exhibits no discharge. Left eye exhibits no discharge.  Neck: Normal range of motion. Neck supple. No thyromegaly present.  Cardiovascular: Normal rate, regular rhythm, normal heart sounds and intact distal  pulses.  No murmur heard. Pulmonary/Chest: Effort normal and breath sounds normal. No respiratory distress. She has no wheezes. She has no rales.  Abdominal: Soft. Bowel sounds are normal. She exhibits no distension and no mass. There is no tenderness.  Musculoskeletal: Normal range of motion. She exhibits no edema or tenderness.  Upper / Lower Extremities: - Normal muscle tone, strength bilateral upper extremities 5/5, lower extremities 5/5  Lymphadenopathy:    She has no cervical adenopathy.  Neurological: She is alert and oriented to person, place, and time.  Distal sensation intact to light touch all extremities  Skin: Skin is warm and dry. No rash noted. She is not diaphoretic. No erythema.  Psychiatric: She has a normal mood and affect. Her behavior is normal.  Well groomed, good eye contact, normal speech and thoughts  Nursing note and vitals reviewed.  Results for orders placed or performed in visit on 07/22/18  T4, free  Result Value Ref Range   Free T4 1.19 0.82 - 1.77 ng/dL  TSH  Result Value Ref Range   TSH 5.290 (H) 0.450 - 4.500 uIU/mL  Hemoglobin A1c  Result Value Ref Range   Hgb A1c MFr Bld 5.6 4.8 - 5.6 %   Est. average glucose Bld gHb Est-mCnc 114 mg/dL  CBC with Differential/Platelet  Result Value Ref Range   WBC 6.8 3.4 - 10.8 x10E3/uL   RBC 4.30 3.77 - 5.28 x10E6/uL   Hemoglobin 12.4 11.1 - 15.9 g/dL   Hematocrit 38.8 34.0 - 46.6 %   MCV 90 79 - 97 fL   MCH 28.8 26.6 - 33.0 pg   MCHC 32.0 31.5 - 35.7 g/dL   RDW 14.7 12.3 - 15.4 %   Platelets 226 150 - 450 x10E3/uL   Neutrophils 45 Not Estab. %   Lymphs 40 Not Estab. %   Monocytes 7 Not Estab. %   Eos 7 Not Estab. %   Basos 1 Not Estab. %   Neutrophils Absolute 3.1 1.4 - 7.0 x10E3/uL   Lymphocytes Absolute 2.7 0.7 - 3.1 x10E3/uL   Monocytes Absolute 0.5 0.1 - 0.9 x10E3/uL   EOS (ABSOLUTE) 0.4 0.0 - 0.4 x10E3/uL   Basophils Absolute 0.1 0.0 - 0.2 x10E3/uL   Immature Granulocytes 0 Not Estab. %    Immature Grans (Abs) 0.0 0.0 - 0.1 x10E3/uL  Lipid panel  Result Value Ref Range   Cholesterol, Total 258 (H) 100 - 199 mg/dL   Triglycerides 111 0 - 149  mg/dL   HDL 64 >39 mg/dL   VLDL Cholesterol Cal 22 5 - 40 mg/dL   LDL Calculated 172 (H) 0 - 99 mg/dL   Chol/HDL Ratio 4.0 0.0 - 4.4 ratio  Comprehensive metabolic panel  Result Value Ref Range   Glucose 92 65 - 99 mg/dL   BUN 15 8 - 27 mg/dL   Creatinine, Ser 1.05 (H) 0.57 - 1.00 mg/dL   GFR calc non Af Amer 55 (L) >59 mL/min/1.73   GFR calc Af Amer 64 >59 mL/min/1.73   BUN/Creatinine Ratio 14 12 - 28   Sodium 142 134 - 144 mmol/L   Potassium 4.0 3.5 - 5.2 mmol/L   Chloride 102 96 - 106 mmol/L   CO2 23 20 - 29 mmol/L   Calcium 9.3 8.7 - 10.3 mg/dL   Total Protein 6.5 6.0 - 8.5 g/dL   Albumin 4.4 3.6 - 4.8 g/dL   Globulin, Total 2.1 1.5 - 4.5 g/dL   Albumin/Globulin Ratio 2.1 1.2 - 2.2   Bilirubin Total 0.6 0.0 - 1.2 mg/dL   Alkaline Phosphatase 72 39 - 117 IU/L   AST 18 0 - 40 IU/L   ALT 20 0 - 32 IU/L  VITAMIN D 25 Hydroxy (Vit-D Deficiency, Fractures)  Result Value Ref Range   Vit D, 25-Hydroxy 43.2 30.0 - 100.0 ng/mL      Assessment & Plan:   Problem List Items Addressed This Visit    Bradycardia    Clinically with some generalized non specific symptoms, question if actually related to reduced heart rate bradycardia. Not induced by medication it seems. - Suspect may be due to hypothyroidism or other factors such as dietary/lifestyle reduced caffeine  Plan Monitor HR and vitals, and symptoms for now Follow-up sooner if needed or return criteria given, if not improve on adjusted dose levothyroxine can consider future cardiology referral      CKD (chronic kidney disease), stage III (HCC)    Stable CKD-III with GFR < 60 At baseline Cr Not on NSAIDs Improve hydration Continue current med regimen, control HTN Follow-up q 6 mo      Elevated hemoglobin A1c    Mild elevated A1c 5.6, unchanged. Not dx  PreDM Concern with obesity, HTN, HLD  Plan:  1. Not on any therapy currently  2. Encourage improved lifestyle - low carb, low sugar diet, reduce portion size, continue improving regular exercise - handout given low glycemic diet 3. Follow-up q 6 mo      Hyperlipidemia    Persistent uncontrolled cholesterol LDL now >170 Last lipid panel 07/2018 Calculated ASCVD 10 yr risk score elevated >7.5%  Plan: 1. Discussion on ASCVD risk and prevention -again - ultimately she did not start prior ASA, today we agree that Zetia is not controlling LDL, offer to repeat trial of statin can do low dose and intermittent trial - Start Rosuavstatin 5mg  twice weekly at night for now - sent 90 day supply to her mail order pharm, may adjust dose in future up to TIW or 1x weekly if needed - Continue Zetia 3. Encourage improved lifestyle - low carb/cholesterol, reduce portion size, continue improving regular exercise 4. Follow-up 6 months - repeat lipids and chemistry      Relevant Medications   rosuvastatin (CRESTOR) 5 MG tablet (Start on 07/31/2018)   Hypertension    Well-controlled HTN - Home BP readings improved control  No known complications     Plan:  1. Continue current BP regimen - Losartan 50mg  daily and HCTZ  12.5mg  daily 2. Encourage improved lifestyle - low sodium diet, regular exercise 3. Continue monitor BP outside office, bring readings to next visit, if persistently >140/90 or new symptoms notify office sooner 4. Follow-up 6 months      Relevant Medications   rosuvastatin (CRESTOR) 5 MG tablet (Start on 07/31/2018)   Hypothyroidism    Elevated TSH, mildly >5 Clinically some concerns for fatigue reduced energy  Plan Increase Levothyroxine from 88 up to 156mcg daily - sent new rx Follow-up repeat labs in 6 months as planned      Relevant Medications   levothyroxine (SYNTHROID, LEVOTHROID) 100 MCG tablet   Vitamin D deficiency    Improved Vitamin D Continue supplement        Other Visit Diagnoses    Annual physical exam    -  Primary Updated Health Maintenance information Reviewed recent lab results with patient Encouraged improvement to lifestyle with diet and exercise    Need for 23-polyvalent pneumococcal polysaccharide vaccine       Relevant Orders   Pneumococcal polysaccharide vaccine 23-valent greater than or equal to 2yo subcutaneous/IM (Completed)      Meds ordered this encounter  Medications  . levothyroxine (SYNTHROID, LEVOTHROID) 100 MCG tablet    Sig: Take 1 tablet (100 mcg total) by mouth daily before breakfast.    Dispense:  90 tablet    Refill:  3    Dose change from 88 up to 100  . rosuvastatin (CRESTOR) 5 MG tablet    Sig: Take 1 tablet (5 mg total) by mouth 2 (two) times a week.    Dispense:  24 tablet    Refill:  1    90 day dosing for twice weekly     Follow up plan: Return in about 6 months (around 01/30/2019) for F/u Lab results - sugar, Thyroid, Cholesterol - med adjust.  Future orders placed for [facility] 01/2019 - patient will call to release orders for pick up or fax  [name], [facility] 07/30/2018, 5:12 PM

## 2018-07-30 NOTE — Assessment & Plan Note (Signed)
Persistent uncontrolled cholesterol LDL now >170 Last lipid panel 07/2018 Calculated ASCVD 10 yr risk score elevated >7.5%  Plan: 1. Discussion on ASCVD risk and prevention -again - ultimately she did not start prior ASA, today we agree that Zetia is not controlling LDL, offer to repeat trial of statin can do low dose and intermittent trial - Start Rosuavstatin 5mg  twice weekly at night for now - sent 90 day supply to her mail order pharm, may adjust dose in future up to TIW or 1x weekly if needed - Continue Zetia 3. Encourage improved lifestyle - low carb/cholesterol, reduce portion size, continue improving regular exercise 4. Follow-up 6 months - repeat lipids and chemistry

## 2018-08-24 ENCOUNTER — Other Ambulatory Visit: Payer: Self-pay | Admitting: Family Medicine

## 2018-08-24 DIAGNOSIS — E785 Hyperlipidemia, unspecified: Secondary | ICD-10-CM

## 2018-09-23 ENCOUNTER — Other Ambulatory Visit: Payer: Self-pay | Admitting: Family Medicine

## 2018-09-23 DIAGNOSIS — E034 Atrophy of thyroid (acquired): Secondary | ICD-10-CM

## 2018-10-12 ENCOUNTER — Ambulatory Visit
Admission: EM | Admit: 2018-10-12 | Discharge: 2018-10-12 | Disposition: A | Discharge status: Home / Self Care | Payer: 59 | Attending: Family Medicine | Admitting: Family Medicine

## 2018-10-12 ENCOUNTER — Ambulatory Visit (INDEPENDENT_AMBULATORY_CARE_PROVIDER_SITE_OTHER): Payer: 59

## 2018-10-12 ENCOUNTER — Other Ambulatory Visit: Payer: Self-pay

## 2018-10-12 ENCOUNTER — Encounter: Payer: Self-pay | Admitting: Emergency Medicine

## 2018-10-12 DIAGNOSIS — L03011 Cellulitis of right finger: Secondary | ICD-10-CM

## 2018-10-12 MED ORDER — MUPIROCIN 2 % EX OINT: 1.0000 "application " | TOPICAL_OINTMENT | Freq: Three times a day (TID) | CUTANEOUS | 0 refills | Status: DC

## 2018-10-12 MED ORDER — DOXYCYCLINE HYCLATE 100 MG PO CAPS: 100.0000 mg | ORAL_CAPSULE | Freq: Two times a day (BID) | ORAL | 0 refills | Status: DC

## 2018-10-12 NOTE — ED Triage Notes (Addendum)
Patient c/o redness, swelling and pain at the tip of her right 2nd finger that started a week ago.  Patient denies fevers.  Patient currently on antibiotic that was given to her in [place].

## 2018-10-12 NOTE — ED Provider Notes (Addendum)
[facility]    CSN: [id number] Arrival date & time: 10/12/18  1241     History   Chief Complaint Chief Complaint  Patient presents with  . finger pain    right 2nd finger    HPI Jillian Fritz is a 67 y.o. female.   HPI  67 year old female presents with a right dominant index finger paronychia she has had for 8 days.  on Saturday of last week she pulled a hangnail and then  she flew to [place] for business . While down there because of the swelling and pain was seen by a nurse.  The nurse had drained the paronychia with  green purulent material according to the patient.  Placed on a soaking solution and also azithromycin antibiotic.  Taken 3 days of the antibiotic.  Not had any relief of her pain and throbbing.  Last night a nurse friend of hers sterilized a sewing needle and punctured her finger several times to release some of the purulence.  Is afebrile.         Past Medical History:  Diagnosis Date  . Asthma   . GERD (gastroesophageal reflux disease)   . Heart murmur   . Heart murmur    states she has 2 murmurs  . Hypertension   . Hypothyroidism   . Migraines   . Obesity   . Shortness of breath dyspnea     Patient Active Problem List   Diagnosis Date Noted  . Bradycardia 07/30/2018  . CKD (chronic kidney disease), stage III ([place]) 07/25/2018  . Elevated hemoglobin A1c 07/25/2018  . Vitamin D deficiency 12/20/2017  . Osteopenia 10/15/2017  . History of abnormal cervical Pap smear 05/31/2017  . Mild intermittent asthma with acute exacerbation 12/31/2016  . Chronic migraine without aura, not intractable 09/18/2016  . Problems with swallowing and mastication   . Stricture and stenosis of esophagus   . Hypothyroidism 02/13/2016  . Hypertension 01/09/2016  . Hyperlipidemia 01/09/2016  . GERD with stricture 01/09/2016    Past Surgical History:  Procedure Laterality Date  . CERVICAL CONE BIOPSY    . ESOPHAGOGASTRODUODENOSCOPY (EGD) WITH  PROPOFOL N/A 03/23/2016   Procedure: ESOPHAGOGASTRODUODENOSCOPY (EGD) WITH esophageal dilation. ;  Surgeon: [name], MD;  Location: [place];  Service: Endoscopy;  Laterality: N/A;  . THROAT SURGERY  04/2014    OB History   None      Home Medications    Prior to Admission medications   Medication Sig Start Date End Date Taking? Authorizing Provider  aspirin EC 81 MG tablet Take 1 tablet (81 mg total) by mouth daily. 05/31/17  Yes Karamalegos, Devonne Doughty, DO  ezetimibe (ZETIA) 10 MG tablet TAKE 1 TABLET BY MOUTH EVERY DAY 08/24/18  Yes Karamalegos, Devonne Doughty, DO  levothyroxine (SYNTHROID, LEVOTHROID) 100 MCG tablet Take 1 tablet (100 mcg total) by mouth daily before breakfast. 07/30/18  Yes Karamalegos, Devonne Doughty, DO  losartan (COZAAR) 50 MG tablet Take 1 tablet (50 mg total) by mouth daily. 07/30/18  Yes Karamalegos, Devonne Doughty, DO  pantoprazole (PROTONIX) 40 MG tablet TAKE 1 TABLET DAILY 07/16/18  Yes Karamalegos, Devonne Doughty, DO  rosuvastatin (CRESTOR) 5 MG tablet Take 1 tablet (5 mg total) by mouth 2 (two) times a week. 07/31/18  Yes Karamalegos, Devonne Doughty, DO  albuterol (PROVENTIL HFA;VENTOLIN HFA) 108 (90 Base) MCG/ACT inhaler Inhale 2 puffs into the lungs every 4 (four) hours as needed for wheezing or shortness of breath (cough). 12/31/16   Karamalegos,  Devonne Doughty, DO  aspirin-acetaminophen-caffeine (EXCEDRIN MIGRAINE) [phone] MG tablet Take 2 tablets by mouth every 6 (six) hours as needed for headache.    [provider]  doxycycline (VIBRAMYCIN) 100 MG capsule Take 1 capsule (100 mg total) by mouth 2 (two) times daily. 10/12/18   Lorin Picket, PA-C  mupirocin ointment (BACTROBAN) 2 % Apply 1 application topically 3 (three) times daily. 10/12/18   Lorin Picket, PA-C  Zoster Vac Recomb Adjuvanted [facility]) injection Inject 0.5 mLs into the muscle every 6 (six) months. For series of 2 injections 05/31/17   Olin Hauser, DO    Family  History Family History  Problem Relation Age of Onset  . Alzheimer's disease Father   . Hypertension Mother   . Breast cancer Paternal Aunt        3 pat aunts    Social History Social History   Tobacco Use  . Smoking status: Never Smoker  . Smokeless tobacco: Never Used  Substance Use Topics  . Alcohol use: No    Alcohol/week: 0.0 standard drinks  . Drug use: No     Allergies   Statins; Lisinopril; Penicillins; and Sulfa antibiotics   Review of Systems Review of Systems  Constitutional: Positive for activity change. Negative for appetite change, chills, fatigue and fever.  Skin: Positive for color change and wound.  All other systems reviewed and are negative.    Physical Exam Triage Vital Signs ED Triage Vitals  Enc Vitals Group     BP 10/12/18 1250 (!) 140/92     Pulse Rate 10/12/18 1250 67     Resp 10/12/18 1250 16     Temp 10/12/18 1250 97.8 F (36.6 C)     Temp Source 10/12/18 1250 Oral     SpO2 10/12/18 1250 100 %     Weight 10/12/18 1249 190 lb (86.2 kg)     Height 10/12/18 1249 5\' 6"  (1.676 m)     Head Circumference --      Peak Flow --      Pain Score 10/12/18 1249 4     Pain Loc --      Pain Edu? --      Excl. in [place]? --    No data found.  Updated Vital Signs BP (!) 140/92 (BP Location: Left Arm)   Pulse 67   Temp 97.8 F (36.6 C) (Oral)   Resp 16   Ht 5\' 6"  (1.676 m)   Wt 190 lb (86.2 kg)   SpO2 100%   BMI 30.67 kg/m   Visual Acuity Right Eye Distance:   Left Eye Distance:   Bilateral Distance:    Right Eye Near:   Left Eye Near:    Bilateral Near:     Physical Exam  Constitutional: She is oriented to person, place, and time. She appears well-developed and well-nourished. No distress.  HENT:  Head: Normocephalic.  Eyes: Pupils are equal, round, and reactive to light.  Neck: Normal range of motion.  Musculoskeletal: Normal range of motion.  Neurological: She is alert and oriented to person, place, and time.  Skin: Skin is  warm and dry. She is not diaphoretic. There is erythema.  Exam of the dominant right index finger shows a large paronychia at the base of the nail with fluctuance present.  Her finger is swollen.  The paronychia has not extended into a felon in the pulp space at the present time although it is rather tense.  It is mostly located over  the radial aspect of the mid nail.  Pocket of fluctuance is over the dorsum.  Psychiatric: She has a normal mood and affect. Her behavior is normal. Thought content normal.  Nursing note and vitals reviewed.    UC Treatments / Results  Labs (all labs ordered are listed, but only abnormal results are displayed) Labs Reviewed - No data to display  EKG None  Radiology Dg Finger Index Right  Result Date: 10/12/2018 CLINICAL DATA:  Patient with pain to the dorsal index finger. No known injury. EXAM: RIGHT INDEX FINGER 2+V COMPARISON:  None. FINDINGS: Normal anatomic alignment. No evidence for acute fracture or dislocation. Degenerative changes of the DIP joint with adjacent corticated ossific density. IMPRESSION: Degenerative changes.  No acute osseous abnormality. Electronically Signed   By: Lovey Newcomer M.D.   On: 10/12/2018 13:52    Procedures After explaining the procedure to the patient she agreed to proceed.Hiba  Cleanse was then utilized to thoroughly cleanse the finger. 18 Gauge needle was then utilized to puncture the area of fluctuance.  Gentle squeezing of the area produced moderate amount of purulence.  Enough purulence was expressed to decompress the fluctuant area adequately. She tolerated the procedure well.  Dry sterile dressing was applied and a stack splint provided for protection.  She will begin a warm compress program with the utilization of Bactroban ointment.  Discontinue the use of the azithromycin and she will be instead be started on doxycycline.  Follow-up in 2 days for reevaluation.  I advised her that there is a possibly become a felon and  would need to be referred to an orthopedic or hand surgeon.  Medications Ordered in UC Medications - No data to display  Initial Impression / Assessment and Plan / UC Course  I have reviewed the triage vital signs and the nursing notes.  Pertinent labs & imaging results that were available during my care of the patient were reviewed by me and considered in my medical decision making (see chart for details).   The patient will start a warm compress program 4 times daily for 10 minutes at a time.  She will then apply Bactroban ointment.  Discontinue the azithromycin and start the doxycycline.  Stack splint was given to the patient for protection of the finger from trauma.  Return in 2 days for reevaluation and possible further I&D   Final Clinical Impressions(s) / UC Diagnoses   Final diagnoses:  Paronychia of right index finger     Discharge Instructions     Wet a washcloth under the faucet and then place in the microwave oven for 10 to 15 seconds.  Place the cloth over the abscess leaving it there for 10 minutes.  Dry the area thoroughly and apply mupirocin ointment to all areas of hardness.  Perform this 3-4 times each and every day until the abscess has resolved.  Be certain to take all of the doxycycline.  If the area appears worsening return to our clinic or go to your primary care physician    ED Prescriptions    Medication Sig Dispense Auth. Provider   mupirocin ointment (BACTROBAN) 2 % Apply 1 application topically 3 (three) times daily. 22 g Crecencio Mc P, PA-C   doxycycline (VIBRAMYCIN) 100 MG capsule Take 1 capsule (100 mg total) by mouth 2 (two) times daily. 14 capsule Lorin Picket, PA-C     Controlled Substance Prescriptions [place] Controlled Substance Registry consulted? Not Applicable   Lorin Picket, PA-C 10/12/18 1626  Lorin Picket, PA-C 10/12/18 [phone]

## 2018-10-12 NOTE — Discharge Instructions (Addendum)
Wet a washcloth under the faucet and then place in the microwave oven for 10 to 15 seconds.  Place the cloth over the abscess leaving it there for 10 minutes.  Dry the area thoroughly and apply mupirocin ointment to all areas of hardness.  Perform this 3-4 times each and every day until the abscess has resolved.  Be certain to take all of the doxycycline.  If the area appears worsening return to our clinic or go to your primary care physician  ° ° °

## 2018-10-14 ENCOUNTER — Encounter: Payer: Self-pay | Admitting: Emergency Medicine

## 2018-10-14 ENCOUNTER — Ambulatory Visit
Admission: EM | Admit: 2018-10-14 | Discharge: 2018-10-14 | Disposition: A | Discharge status: Home / Self Care | Payer: 59 | Attending: Family Medicine | Admitting: Family Medicine

## 2018-10-14 ENCOUNTER — Other Ambulatory Visit: Payer: Self-pay

## 2018-10-14 DIAGNOSIS — L03011 Cellulitis of right finger: Secondary | ICD-10-CM | POA: Diagnosis not present

## 2018-10-14 DIAGNOSIS — L03019 Cellulitis of unspecified finger: Secondary | ICD-10-CM

## 2018-10-14 NOTE — ED Triage Notes (Signed)
Patient here for follow-up for reevaluation of right index finger. Patient is still taking her antibiotics. She states she does not feel like her finger has improved at all. Still has pain and swelling to the site.

## 2018-10-14 NOTE — ED Provider Notes (Signed)
[facility]    CSN: [id number] Arrival date & time: 10/14/18  1342  History   Chief Complaint Chief Complaint  Patient presents with  . Wound Check   HPI 67 year old female presents for evaluation regarding a recent paronychia.  Patient recently was seen on 11/10.  Paronychia was drained.  She was placed on doxycycline and Bactroban.  Has not improved.  Seems to be worsening.  Area is swollen and very tender to palpation.  No fever.  No other associated symptoms.  No other complaints.  Past Medical History:  Diagnosis Date  . Asthma   . GERD (gastroesophageal reflux disease)   . Heart murmur   . Heart murmur    states she has 2 murmurs  . Hypertension   . Hypothyroidism   . Migraines   . Obesity   . Shortness of breath dyspnea     Patient Active Problem List   Diagnosis Date Noted  . Bradycardia 07/30/2018  . CKD (chronic kidney disease), stage III ([place]) 07/25/2018  . Elevated hemoglobin A1c 07/25/2018  . Vitamin D deficiency 12/20/2017  . Osteopenia 10/15/2017  . History of abnormal cervical Pap smear 05/31/2017  . Mild intermittent asthma with acute exacerbation 12/31/2016  . Chronic migraine without aura, not intractable 09/18/2016  . Problems with swallowing and mastication   . Stricture and stenosis of esophagus   . Hypothyroidism 02/13/2016  . Hypertension 01/09/2016  . Hyperlipidemia 01/09/2016  . GERD with stricture 01/09/2016    Past Surgical History:  Procedure Laterality Date  . CERVICAL CONE BIOPSY    . ESOPHAGOGASTRODUODENOSCOPY (EGD) WITH PROPOFOL N/A 03/23/2016   Procedure: ESOPHAGOGASTRODUODENOSCOPY (EGD) WITH esophageal dilation. ;  Surgeon: [name], MD;  Location: [place];  Service: Endoscopy;  Laterality: N/A;  . THROAT SURGERY  04/2014    OB History   None    Home Medications    Prior to Admission medications   Medication Sig Start Date End Date Taking? Authorizing Provider  albuterol (PROVENTIL  HFA;VENTOLIN HFA) 108 (90 Base) MCG/ACT inhaler Inhale 2 puffs into the lungs every 4 (four) hours as needed for wheezing or shortness of breath (cough). 12/31/16  Yes Karamalegos, Devonne Doughty, DO  aspirin EC 81 MG tablet Take 1 tablet (81 mg total) by mouth daily. 05/31/17  Yes Karamalegos, Devonne Doughty, DO  aspirin-acetaminophen-caffeine (EXCEDRIN MIGRAINE) [phone] MG tablet Take 2 tablets by mouth every 6 (six) hours as needed for headache.   Yes [provider]  doxycycline (VIBRAMYCIN) 100 MG capsule Take 1 capsule (100 mg total) by mouth 2 (two) times daily. 10/12/18  Yes Lorin Picket, PA-C  ezetimibe (ZETIA) 10 MG tablet TAKE 1 TABLET BY MOUTH EVERY DAY 08/24/18  Yes Karamalegos, Devonne Doughty, DO  levothyroxine (SYNTHROID, LEVOTHROID) 100 MCG tablet Take 1 tablet (100 mcg total) by mouth daily before breakfast. 07/30/18  Yes Karamalegos, Devonne Doughty, DO  losartan (COZAAR) 50 MG tablet Take 1 tablet (50 mg total) by mouth daily. 07/30/18  Yes Karamalegos, Devonne Doughty, DO  mupirocin ointment (BACTROBAN) 2 % Apply 1 application topically 3 (three) times daily. 10/12/18  Yes Lorin Picket, PA-C  pantoprazole (PROTONIX) 40 MG tablet TAKE 1 TABLET DAILY 07/16/18  Yes Karamalegos, Devonne Doughty, DO  rosuvastatin (CRESTOR) 5 MG tablet Take 1 tablet (5 mg total) by mouth 2 (two) times a week. 07/31/18  Yes Karamalegos, Devonne Doughty, DO  Zoster Vac Recomb Adjuvanted [facility]) injection Inject 0.5 mLs into the muscle every 6 (six) months. For series  of 2 injections 05/31/17  Yes Karamalegos, Devonne Doughty, DO    Family History Family History  Problem Relation Age of Onset  . Alzheimer's disease Father   . Hypertension Mother   . Breast cancer Paternal Aunt        3 pat aunts    Social History Social History   Tobacco Use  . Smoking status: Never Smoker  . Smokeless tobacco: Never Used  Substance Use Topics  . Alcohol use: No    Alcohol/week: 0.0 standard drinks  . Drug use: No      Allergies   Statins; Lisinopril; Penicillins; and Sulfa antibiotics   Review of Systems Review of Systems  Constitutional: Negative.   Skin:       Finger infection.    Physical Exam Triage Vital Signs ED Triage Vitals  Enc Vitals Group     BP 10/14/18 1354 (!) 143/90     Pulse Rate 10/14/18 1354 77     Resp 10/14/18 1354 18     Temp 10/14/18 1354 98.5 F (36.9 C)     Temp Source 10/14/18 1354 Oral     SpO2 10/14/18 1354 100 %     Weight 10/14/18 1352 190 lb (86.2 kg)     Height 10/14/18 1352 5\' 6"  (1.676 m)     Head Circumference --      Peak Flow --      Pain Score 10/14/18 1352 4     Pain Loc --      Pain Edu? --      Excl. in [place]? --    No data found.  Updated Vital Signs BP (!) 143/90 (BP Location: Left Arm)   Pulse 77   Temp 98.5 F (36.9 C) (Oral)   Resp 18   Ht 5\' 6"  (1.676 m)   Wt 86.2 kg   SpO2 100%   BMI 30.67 kg/m   Visual Acuity Right Eye Distance:   Left Eye Distance:   Bilateral Distance:    Right Eye Near:   Left Eye Near:    Bilateral Near:     Physical Exam  Constitutional: She is oriented to person, place, and time. She appears well-developed. No distress.  HENT:  Head: Normocephalic and atraumatic.  Pulmonary/Chest: Effort normal. No respiratory distress.  Neurological: She is alert and oriented to person, place, and time.  Skin:  Right index finger -fluctuance noted around the nailbed with erythema and swelling extending to the pulp of the finger.  Consistent with felon.  Psychiatric: She has a normal mood and affect. Her behavior is normal.  Nursing note and vitals reviewed.  UC Treatments / Results  Labs (all labs ordered are listed, but only abnormal results are displayed) Labs Reviewed - No data to display  EKG None  Radiology No results found.  Procedures Procedures (including critical care time)  Medications Ordered in UC Medications - No data to display  Initial Impression / Assessment and Plan / UC  Course  I have reviewed the triage vital signs and the nursing notes.  Pertinent labs & imaging results that were available during my care of the patient were reviewed by me and considered in my medical decision making (see chart for details).    67 year old female presents with a felon.  Sending to [facility] for incision and drainage.  Final Clinical Impressions(s) / UC Diagnoses   Final diagnoses:  Felon of finger     Discharge Instructions     Go to [facility] (  [place]).  Take care  Dr. Lacinda Axon    ED Prescriptions    None     Controlled Substance Prescriptions [place] Controlled Substance Registry consulted? Not Applicable   Coral Spikes, DO 10/14/18 1550

## 2018-10-14 NOTE — Discharge Instructions (Signed)
Go to [facility] ([address], [place]).  Take care  Dr. Lacinda Axon

## 2018-10-26 ENCOUNTER — Other Ambulatory Visit: Payer: Self-pay | Admitting: Family Medicine

## 2018-10-26 DIAGNOSIS — I10 Essential (primary) hypertension: Secondary | ICD-10-CM

## 2018-12-22 ENCOUNTER — Other Ambulatory Visit: Payer: Self-pay | Admitting: Family Medicine

## 2018-12-22 DIAGNOSIS — E034 Atrophy of thyroid (acquired): Secondary | ICD-10-CM

## 2019-01-20 ENCOUNTER — Other Ambulatory Visit: Payer: Self-pay | Admitting: Family Medicine

## 2019-01-20 DIAGNOSIS — I10 Essential (primary) hypertension: Secondary | ICD-10-CM

## 2019-01-27 ENCOUNTER — Other Ambulatory Visit: Payer: Self-pay | Admitting: Family Medicine

## 2019-01-27 DIAGNOSIS — E782 Mixed hyperlipidemia: Secondary | ICD-10-CM

## 2019-01-27 DIAGNOSIS — N183 Chronic kidney disease, stage 3 unspecified: Secondary | ICD-10-CM

## 2019-01-27 DIAGNOSIS — E034 Atrophy of thyroid (acquired): Secondary | ICD-10-CM

## 2019-01-27 DIAGNOSIS — I10 Essential (primary) hypertension: Secondary | ICD-10-CM

## 2019-01-27 DIAGNOSIS — R7309 Other abnormal glucose: Secondary | ICD-10-CM

## 2019-01-29 LAB — COMPREHENSIVE METABOLIC PANEL
ALT: 25 IU/L (ref 0–32)
AST: 18 IU/L (ref 0–40)
Albumin/Globulin Ratio: 2.3 — ABNORMAL HIGH (ref 1.2–2.2)
Albumin: 4.3 g/dL (ref 3.8–4.8)
Alkaline Phosphatase: 85 IU/L (ref 39–117)
BUN / CREAT RATIO: 22 (ref 12–28)
BUN: 17 mg/dL (ref 8–27)
Bilirubin Total: 0.6 mg/dL (ref 0.0–1.2)
CALCIUM: 9.4 mg/dL (ref 8.7–10.3)
CO2: 23 mmol/L (ref 20–29)
Chloride: 106 mmol/L (ref 96–106)
Creatinine, Ser: 0.77 mg/dL (ref 0.57–1.00)
GFR calc non Af Amer: 81 mL/min/{1.73_m2} (ref >59)
GFR, EST AFRICAN AMERICAN: 93 mL/min/{1.73_m2} (ref >59)
GLOBULIN, TOTAL: 1.9 g/dL (ref 1.5–4.5)
Glucose: 85 mg/dL (ref 65–99)
POTASSIUM: 3.9 mmol/L (ref 3.5–5.2)
SODIUM: 145 mmol/L — AB (ref 134–144)
Total Protein: 6.2 g/dL (ref 6.0–8.5)

## 2019-01-29 LAB — LIPID PANEL
CHOL/HDL RATIO: 2.7 ratio (ref 0.0–4.4)
Cholesterol, Total: 179 mg/dL (ref 100–199)
HDL: 67 mg/dL (ref >39)
LDL Calculated: 91 mg/dL (ref 0–99)
Triglycerides: 106 mg/dL (ref 0–149)
VLDL CHOLESTEROL CAL: 21 mg/dL (ref 5–40)

## 2019-01-29 LAB — HEMOGLOBIN A1C
ESTIMATED AVERAGE GLUCOSE: 114 mg/dL
HEMOGLOBIN A1C: 5.6 % (ref 4.8–5.6)

## 2019-01-29 LAB — T4, FREE: FREE T4: 2.28 ng/dL — AB (ref 0.82–1.77)

## 2019-01-29 LAB — TSH: TSH: 0.007 u[IU]/mL — AB (ref 0.450–4.500)

## 2019-01-30 ENCOUNTER — Other Ambulatory Visit: Payer: Self-pay | Admitting: Family Medicine

## 2019-01-30 ENCOUNTER — Ambulatory Visit (INDEPENDENT_AMBULATORY_CARE_PROVIDER_SITE_OTHER): Payer: 59 | Admitting: Family Medicine

## 2019-01-30 ENCOUNTER — Other Ambulatory Visit: Payer: Self-pay

## 2019-01-30 ENCOUNTER — Encounter: Payer: Self-pay | Admitting: Family Medicine

## 2019-01-30 VITALS — BP 128/80 | HR 74 | Temp 98.4°F | Resp 16 | Ht 66.0 in | Wt 191.0 lb

## 2019-01-30 DIAGNOSIS — E782 Mixed hyperlipidemia: Secondary | ICD-10-CM | POA: Diagnosis not present

## 2019-01-30 DIAGNOSIS — I129 Hypertensive chronic kidney disease with stage 1 through stage 4 chronic kidney disease, or unspecified chronic kidney disease: Secondary | ICD-10-CM

## 2019-01-30 DIAGNOSIS — N182 Chronic kidney disease, stage 2 (mild): Secondary | ICD-10-CM | POA: Diagnosis not present

## 2019-01-30 DIAGNOSIS — R7309 Other abnormal glucose: Secondary | ICD-10-CM | POA: Diagnosis not present

## 2019-01-30 DIAGNOSIS — E034 Atrophy of thyroid (acquired): Secondary | ICD-10-CM

## 2019-01-30 MED ORDER — HYDROCHLOROTHIAZIDE 12.5 MG PO CAPS: 12.5000 mg | ORAL_CAPSULE | Freq: Every day | ORAL | 1 refills | Status: DC

## 2019-01-30 NOTE — Assessment & Plan Note (Signed)
Now low TSH and high Free T4 after last med dose change Synthroid 88 up to 18mcg Seems relatively asymptomatic with too high thyroid hormone now  Plan Await to hear from patient regarding her home dose, we believe it is Synthroid 155mcg daily, likely reduce down to 57mcg daily empty stomach daily next - will send order when hear back  Repeat labs TSH Free T4 from [facility] - notify us when ready for labs in 3 months - no office visit at that time, will print and release orders for pick up

## 2019-01-30 NOTE — Assessment & Plan Note (Signed)
Well-controlled HTN - improved on re-check - Home BP readings improved control  No known complications     Plan:  1. Continue current BP regimen - Losartan 50mg  daily and HCTZ 12.5mg  daily - refill 2. Encourage improved lifestyle - low sodium diet, regular exercise 3. Continue monitor BP outside office, bring readings to next visit, if persistently >140/90 or new symptoms notify office sooner 4. Follow-up 6 months annual

## 2019-01-30 NOTE — Patient Instructions (Addendum)
Thank you for coming to the office today.  Thyroid lab shows too high level of thyroid hormone - recommend to reduce dose - if you are on 100 we can re-send the 88, if you are 88 we can order the 75  Let me know by phone or message which one you have / need.  Recent Labs    07/23/18 1143 01/28/19 1034  HGBA1C 5.6 5.6    Sugar is stable. Not PreDiabetes.  Cholesterol is much improved, can take only 1 a week or still 2 if prefer.  -------------------------------------  DUE for FASTING BLOOD WORK (no food or drink after midnight before the lab appointment, only water or coffee without cream/sugar on the morning of)  3 months for thyroid only - [facility] - call us and pick up or request fax of order  Then again in August 2020 - 1 week before your upcoming Physical  For Lab Results, once available within 2-3 days of blood draw, you can can log in to MyChart online to view your results and a brief explanation. Also, we can discuss results at next follow-up visit.   Please schedule a Follow-up Appointment to: Return in about 6 months (around 07/31/2019) for Annual Physical.  If you have any other questions or concerns, please feel free to call the office or send a message through [place]. You may also schedule an earlier appointment if necessary.  Additionally, you may be receiving a survey about your experience at our office within a few days to 1 week by e-mail or mail. We value your feedback.  [name], DO [place]

## 2019-01-30 NOTE — Assessment & Plan Note (Signed)
Dramatically improved LDL cholesterol on intermittent statin now Last lipid panel 01/2019 Calculated ASCVD 10 yr risk score elevated >7.5%  Plan: 1. Continue Rosuavstatin 5mg  twice weekly at night for now - Continue Zetia 3. Encourage improved lifestyle - low carb/cholesterol, reduce portion size, continue improving regular exercise

## 2019-01-30 NOTE — Assessment & Plan Note (Signed)
Mild elevated A1c 5.6, unchanged. Not dx PreDM Concern with obesity, HTN, HLD  Plan:  1. Not on any therapy currently  2. Encourage improved lifestyle - low carb, low sugar diet, reduce portion size, continue improving regular exercise - handout given low glycemic diet  Lab A1c in 6 months 07/2019 yearly

## 2019-01-30 NOTE — Assessment & Plan Note (Signed)
Stable to improved now CKDII from prior III, GFR improved Improvede Cr Not on NSAIDs Improve hydration Continue current med regimen, control HTN

## 2019-01-30 NOTE — Progress Notes (Signed)
Subjective:    Patient ID: Jillian Fritz, female    DOB: Mar 19, 1951, 68 y.o.   MRN: [id number]  Jillian Fritz is a 68 y.o. female presenting on 01/30/2019 for Hypothyroidism   HPI  CHRONIC HTN with CKD-III Improved Cr on last lab. To 0.77 from prior 1.05 - Today patient reportsstilloverall doing well with home BP readings controlled Current Meds - Losartan 50mg  daily, HCTZ 12.5mg  daily (has med but it is no longer on our list?) Tolerating well, w/o complaints. - Still improved edema Not taking NSAIDs  Elevated A1c Previous elevated A1c 5.6, not in range of PreDM. Last lab now is stable at 5.6 Meds: not on meds Currently on ARB Improving low carb diet Limited regular exercise, plans to improve this in future when retire.  HYPERLIPIDEMIA: - Reports concerns with prior elevated LDL and total cholesterol, last she was started on low dose Rosuvastatin 5mg  twice a week in past 6 month - Today lab results reviewed dramatic improved reduced cholesterol and LDL 90s now. Only had mild myalgia tolerating well. - taking Zetia 10 and Rosuva 5 twice week  Hypothyroidism: Last labs at prior visit showed mild elevated TSH, and we increased her dose from levothyroxine 88 up to 159mcg daily, now recent lab shows LOW TSH and high Free T4, and she is unsure if on 88 or 100 at home. - No significant symptoms Admits tired and worn out from work and travel - Denies any significant fatigue or reduced energy  Osteopenia, spine / Vitamin D Deficiency Improved Vitamin D on last lab. Taking supplement.   Depression screen [facility] 2/9 01/30/2019 07/30/2018 04/26/2017  Decreased Interest 0 0 0  Down, Depressed, Hopeless 0 0 0  PHQ - 2 Score 0 0 0    Social History   Tobacco Use  . Smoking status: Never Smoker  . Smokeless tobacco: Never Used  Substance Use Topics  . Alcohol use: No    Alcohol/week: 0.0 standard drinks  . Drug use: No    Review of Systems Per HPI unless  specifically indicated above     Objective:    BP 128/80 (BP Location: Left Arm, Cuff Size: Normal)   Pulse 74   Temp 98.4 F (36.9 C) (Oral)   Resp 16   Ht 5\' 6"  (1.676 m)   Wt 191 lb (86.6 kg)   SpO2 97%   BMI 30.83 kg/m   Wt Readings from Last 3 Encounters:  01/30/19 191 lb (86.6 kg)  10/14/18 190 lb (86.2 kg)  10/12/18 190 lb (86.2 kg)    Physical Exam Vitals signs and nursing note reviewed.  Constitutional:      General: She is not in acute distress.    Appearance: She is well-developed. She is not diaphoretic.     Comments: Well-appearing, comfortable, cooperative  HENT:     Head: Normocephalic and atraumatic.  Eyes:     General:        Right eye: No discharge.        Left eye: No discharge.     Conjunctiva/sclera: Conjunctivae normal.  Neck:     Musculoskeletal: Normal range of motion and neck supple.     Thyroid: No thyromegaly.  Cardiovascular:     Rate and Rhythm: Normal rate and regular rhythm.     Heart sounds: Normal heart sounds. No murmur.  Pulmonary:     Effort: Pulmonary effort is normal. No respiratory distress.     Breath sounds: Normal breath sounds. No wheezing  or rales.  Musculoskeletal: Normal range of motion.  Lymphadenopathy:     Cervical: No cervical adenopathy.  Skin:    General: Skin is warm and dry.     Findings: No erythema or rash.  Neurological:     Mental Status: She is alert and oriented to person, place, and time.  Psychiatric:        Behavior: Behavior normal.     Comments: Well groomed, good eye contact, normal speech and thoughts    Results for orders placed or performed in visit on 01/27/19  Comprehensive metabolic panel  Result Value Ref Range   Glucose 85 65 - 99 mg/dL   BUN 17 8 - 27 mg/dL   Creatinine, Ser 0.77 0.57 - 1.00 mg/dL   GFR calc non Af Amer 81 >59 mL/min/1.73   GFR calc Af Amer 93 >59 mL/min/1.73   BUN/Creatinine Ratio 22 12 - 28   Sodium 145 (H) 134 - 144 mmol/L   Potassium 3.9 3.5 - 5.2 mmol/L    Chloride 106 96 - 106 mmol/L   CO2 23 20 - 29 mmol/L   Calcium 9.4 8.7 - 10.3 mg/dL   Total Protein 6.2 6.0 - 8.5 g/dL   Albumin 4.3 3.8 - 4.8 g/dL   Globulin, Total 1.9 1.5 - 4.5 g/dL   Albumin/Globulin Ratio 2.3 (H) 1.2 - 2.2   Bilirubin Total 0.6 0.0 - 1.2 mg/dL   Alkaline Phosphatase 85 39 - 117 IU/L   AST 18 0 - 40 IU/L   ALT 25 0 - 32 IU/L  T4, free  Result Value Ref Range   Free T4 2.28 (H) 0.82 - 1.77 ng/dL  TSH  Result Value Ref Range   TSH 0.007 (L) 0.450 - 4.500 uIU/mL  Lipid panel  Result Value Ref Range   Cholesterol, Total 179 100 - 199 mg/dL   Triglycerides 106 0 - 149 mg/dL   HDL 67 >39 mg/dL   VLDL Cholesterol Cal 21 5 - 40 mg/dL   LDL Calculated 91 0 - 99 mg/dL   Chol/HDL Ratio 2.7 0.0 - 4.4 ratio  Hemoglobin A1c  Result Value Ref Range   Hgb A1c MFr Bld 5.6 4.8 - 5.6 %   Est. average glucose Bld gHb Est-mCnc 114 mg/dL      Assessment & Plan:   Problem List Items Addressed This Visit    Benign hypertension with CKD (chronic kidney disease), stage II    Well-controlled HTN - improved on re-check - Home BP readings improved control  No known complications     Plan:  1. Continue current BP regimen - Losartan 50mg  daily and HCTZ 12.5mg  daily - refill 2. Encourage improved lifestyle - low sodium diet, regular exercise 3. Continue monitor BP outside office, bring readings to next visit, if persistently >140/90 or new symptoms notify office sooner 4. Follow-up 6 months annual      Relevant Medications   hydrochlorothiazide (MICROZIDE) 12.5 MG capsule   CKD (chronic kidney disease), stage II    Stable to improved now CKDII from prior III, GFR improved Improvede Cr Not on NSAIDs Improve hydration Continue current med regimen, control HTN      Elevated hemoglobin A1c    Mild elevated A1c 5.6, unchanged. Not dx PreDM Concern with obesity, HTN, HLD  Plan:  1. Not on any therapy currently  2. Encourage improved lifestyle - low carb, low sugar diet,  reduce portion size, continue improving regular exercise - handout given low glycemic diet  Lab  A1c in 6 months 07/2019 yearly      Hyperlipidemia    Dramatically improved LDL cholesterol on intermittent statin now Last lipid panel 01/2019 Calculated ASCVD 10 yr risk score elevated >7.5%  Plan: 1. Continue Rosuavstatin 5mg  twice weekly at night for now - Continue Zetia 3. Encourage improved lifestyle - low carb/cholesterol, reduce portion size, continue improving regular exercise      Relevant Medications   hydrochlorothiazide (MICROZIDE) 12.5 MG capsule   Hypothyroidism - Primary    Now low TSH and high Free T4 after last med dose change Synthroid 88 up to 175mcg Seems relatively asymptomatic with too high thyroid hormone now  Plan Await to hear from patient regarding her home dose, we believe it is Synthroid 110mcg daily, likely reduce down to 43mcg daily empty stomach daily next - will send order when hear back  Repeat labs TSH Free T4 from [facility] - notify us when ready for labs in 3 months - no office visit at that time, will print and release orders for pick up         Meds ordered this encounter  Medications  . hydrochlorothiazide (MICROZIDE) 12.5 MG capsule    Sig: Take 1 capsule (12.5 mg total) by mouth daily.    Dispense:  90 capsule    Refill:  1     Follow up plan: Return in about 6 months (around 07/31/2019) for Annual Physical.  Future labs ordered for 04/2019 for thyroid check, [facility] - no office visit. Then will order for 07/2019 in future for physical.  [name], [facility] 01/30/2019, 8:57 AM

## 2019-01-31 ENCOUNTER — Other Ambulatory Visit: Payer: Self-pay | Admitting: Family Medicine

## 2019-01-31 DIAGNOSIS — E034 Atrophy of thyroid (acquired): Secondary | ICD-10-CM

## 2019-01-31 MED ORDER — LEVOTHYROXINE SODIUM 88 MCG PO TABS: 88.0000 ug | ORAL_TABLET | Freq: Every day | ORAL | 1 refills | Status: DC

## 2019-03-05 ENCOUNTER — Other Ambulatory Visit: Payer: Self-pay | Admitting: Family Medicine

## 2019-03-05 DIAGNOSIS — E782 Mixed hyperlipidemia: Secondary | ICD-10-CM

## 2019-03-24 ENCOUNTER — Other Ambulatory Visit: Payer: Self-pay | Admitting: Family Medicine

## 2019-03-24 DIAGNOSIS — I129 Hypertensive chronic kidney disease with stage 1 through stage 4 chronic kidney disease, or unspecified chronic kidney disease: Secondary | ICD-10-CM

## 2019-03-24 DIAGNOSIS — N182 Chronic kidney disease, stage 2 (mild): Principal | ICD-10-CM

## 2019-03-24 MED ORDER — HYDROCHLOROTHIAZIDE 12.5 MG PO TABS: 12.5000 mg | ORAL_TABLET | Freq: Every day | ORAL | 1 refills | Status: DC

## 2019-03-29 DIAGNOSIS — I10 Essential (primary) hypertension: Secondary | ICD-10-CM

## 2019-03-30 ENCOUNTER — Telehealth: Payer: Self-pay

## 2019-03-30 DIAGNOSIS — I10 Essential (primary) hypertension: Secondary | ICD-10-CM

## 2019-03-30 MED ORDER — LOSARTAN POTASSIUM 50 MG PO TABS: 50.0000 mg | ORAL_TABLET | Freq: Every day | ORAL | 1 refills | Status: DC

## 2019-03-30 NOTE — Telephone Encounter (Signed)
Pt requested a refill of Losartan and prescription was sent.

## 2019-07-15 DIAGNOSIS — H5213 Myopia, bilateral: Secondary | ICD-10-CM | POA: Diagnosis not present

## 2019-07-15 DIAGNOSIS — H04123 Dry eye syndrome of bilateral lacrimal glands: Secondary | ICD-10-CM | POA: Diagnosis not present

## 2019-07-15 DIAGNOSIS — H2513 Age-related nuclear cataract, bilateral: Secondary | ICD-10-CM | POA: Diagnosis not present

## 2019-08-03 ENCOUNTER — Telehealth: Payer: Self-pay

## 2019-08-03 DIAGNOSIS — I129 Hypertensive chronic kidney disease with stage 1 through stage 4 chronic kidney disease, or unspecified chronic kidney disease: Secondary | ICD-10-CM

## 2019-08-03 DIAGNOSIS — E559 Vitamin D deficiency, unspecified: Secondary | ICD-10-CM

## 2019-08-03 DIAGNOSIS — R7309 Other abnormal glucose: Secondary | ICD-10-CM

## 2019-08-03 DIAGNOSIS — E034 Atrophy of thyroid (acquired): Secondary | ICD-10-CM

## 2019-08-03 DIAGNOSIS — N182 Chronic kidney disease, stage 2 (mild): Secondary | ICD-10-CM

## 2019-08-03 DIAGNOSIS — Z0000 Encounter for general adult medical examination without abnormal findings: Secondary | ICD-10-CM

## 2019-08-03 NOTE — Telephone Encounter (Signed)
Patient's have  Different insurance and due for physical lab has been ordered with Dxs. She also has Hx of vit D deficiency and hypothyroidism don't know if you want additional blood work for her physical TSH and free T4 and Vit D ?

## 2019-08-04 NOTE — Telephone Encounter (Signed)
All orders look perfect. Thank you. I did go ahead and add Vit D and Thyroid for [facility], released orders.  [name], [facility] 08/04/2019, 3:22 PM

## 2019-08-05 DIAGNOSIS — Z0000 Encounter for general adult medical examination without abnormal findings: Secondary | ICD-10-CM | POA: Diagnosis not present

## 2019-08-05 DIAGNOSIS — I129 Hypertensive chronic kidney disease with stage 1 through stage 4 chronic kidney disease, or unspecified chronic kidney disease: Secondary | ICD-10-CM | POA: Diagnosis not present

## 2019-08-05 DIAGNOSIS — N182 Chronic kidney disease, stage 2 (mild): Secondary | ICD-10-CM | POA: Diagnosis not present

## 2019-08-05 DIAGNOSIS — E559 Vitamin D deficiency, unspecified: Secondary | ICD-10-CM | POA: Diagnosis not present

## 2019-08-05 DIAGNOSIS — E034 Atrophy of thyroid (acquired): Secondary | ICD-10-CM | POA: Diagnosis not present

## 2019-08-05 DIAGNOSIS — R7309 Other abnormal glucose: Secondary | ICD-10-CM | POA: Diagnosis not present

## 2019-08-06 ENCOUNTER — Ambulatory Visit: Payer: Self-pay | Admitting: Family Medicine

## 2019-08-06 LAB — COMPREHENSIVE METABOLIC PANEL
ALT: 20 IU/L (ref 0–32)
AST: 20 IU/L (ref 0–40)
Albumin/Globulin Ratio: 2.3 — ABNORMAL HIGH (ref 1.2–2.2)
Albumin: 4.5 g/dL (ref 3.8–4.8)
Alkaline Phosphatase: 91 IU/L (ref 39–117)
BUN/Creatinine Ratio: 15 (ref 12–28)
BUN: 15 mg/dL (ref 8–27)
Bilirubin Total: 0.5 mg/dL (ref 0.0–1.2)
CO2: 23 mmol/L (ref 20–29)
Calcium: 9.9 mg/dL (ref 8.7–10.3)
Chloride: 101 mmol/L (ref 96–106)
Creatinine, Ser: 0.99 mg/dL (ref 0.57–1.00)
GFR calc Af Amer: 68 mL/min/{1.73_m2} (ref >59)
GFR calc non Af Amer: 59 mL/min/{1.73_m2} — ABNORMAL LOW (ref >59)
Globulin, Total: 2 g/dL (ref 1.5–4.5)
Glucose: 92 mg/dL (ref 65–99)
Potassium: 4.3 mmol/L (ref 3.5–5.2)
Sodium: 139 mmol/L (ref 134–144)
Total Protein: 6.5 g/dL (ref 6.0–8.5)

## 2019-08-06 LAB — LIPID PANEL
Chol/HDL Ratio: 3.9 ratio (ref 0.0–4.4)
Cholesterol, Total: 272 mg/dL — ABNORMAL HIGH (ref 100–199)
HDL: 69 mg/dL (ref >39)
LDL Chol Calc (NIH): 181 mg/dL — ABNORMAL HIGH (ref 0–99)
Triglycerides: 124 mg/dL (ref 0–149)
VLDL Cholesterol Cal: 22 mg/dL (ref 5–40)

## 2019-08-06 LAB — CBC WITH DIFFERENTIAL/PLATELET
Basophils Absolute: 0.1 10*3/uL (ref 0.0–0.2)
Basos: 1 %
EOS (ABSOLUTE): 0.3 10*3/uL (ref 0.0–0.4)
Eos: 4 %
Hematocrit: 41.2 % (ref 34.0–46.6)
Hemoglobin: 13.2 g/dL (ref 11.1–15.9)
Immature Grans (Abs): 0 10*3/uL (ref 0.0–0.1)
Immature Granulocytes: 0 %
Lymphocytes Absolute: 2.7 10*3/uL (ref 0.7–3.1)
Lymphs: 36 %
MCH: 29.5 pg (ref 26.6–33.0)
MCHC: 32 g/dL (ref 31.5–35.7)
MCV: 92 fL (ref 79–97)
Monocytes Absolute: 0.6 10*3/uL (ref 0.1–0.9)
Monocytes: 8 %
Neutrophils Absolute: 3.8 10*3/uL (ref 1.4–7.0)
Neutrophils: 51 %
Platelets: 231 10*3/uL (ref 150–450)
RBC: 4.48 x10E6/uL (ref 3.77–5.28)
RDW: 13.6 % (ref 11.7–15.4)
WBC: 7.5 10*3/uL (ref 3.4–10.8)

## 2019-08-06 LAB — HEMOGLOBIN A1C
Est. average glucose Bld gHb Est-mCnc: 111 mg/dL
Hgb A1c MFr Bld: 5.5 % (ref 4.8–5.6)

## 2019-08-06 LAB — T4, FREE: Free T4: 1.65 ng/dL (ref 0.82–1.77)

## 2019-08-06 LAB — TSH: TSH: 0.787 u[IU]/mL (ref 0.450–4.500)

## 2019-08-06 LAB — VITAMIN D 25 HYDROXY (VIT D DEFICIENCY, FRACTURES): Vit D, 25-Hydroxy: 34.2 ng/mL (ref 30.0–100.0)

## 2019-08-06 NOTE — Progress Notes (Signed)
Entered in error

## 2019-08-07 ENCOUNTER — Other Ambulatory Visit: Payer: Self-pay

## 2019-08-07 ENCOUNTER — Ambulatory Visit (INDEPENDENT_AMBULATORY_CARE_PROVIDER_SITE_OTHER): Payer: Medicare Other | Admitting: Family Medicine

## 2019-08-07 ENCOUNTER — Encounter: Payer: Self-pay | Admitting: Family Medicine

## 2019-08-07 VITALS — BP 128/77 | HR 63 | Temp 98.0°F | Resp 16 | Ht 66.0 in | Wt 184.0 lb

## 2019-08-07 DIAGNOSIS — I129 Hypertensive chronic kidney disease with stage 1 through stage 4 chronic kidney disease, or unspecified chronic kidney disease: Secondary | ICD-10-CM

## 2019-08-07 DIAGNOSIS — Z23 Encounter for immunization: Secondary | ICD-10-CM

## 2019-08-07 DIAGNOSIS — K222 Esophageal obstruction: Secondary | ICD-10-CM

## 2019-08-07 DIAGNOSIS — Z0000 Encounter for general adult medical examination without abnormal findings: Secondary | ICD-10-CM | POA: Diagnosis not present

## 2019-08-07 DIAGNOSIS — Z1239 Encounter for other screening for malignant neoplasm of breast: Secondary | ICD-10-CM

## 2019-08-07 DIAGNOSIS — E034 Atrophy of thyroid (acquired): Secondary | ICD-10-CM

## 2019-08-07 DIAGNOSIS — Z1211 Encounter for screening for malignant neoplasm of colon: Secondary | ICD-10-CM

## 2019-08-07 DIAGNOSIS — R7309 Other abnormal glucose: Secondary | ICD-10-CM

## 2019-08-07 DIAGNOSIS — E559 Vitamin D deficiency, unspecified: Secondary | ICD-10-CM

## 2019-08-07 DIAGNOSIS — E663 Overweight: Secondary | ICD-10-CM

## 2019-08-07 DIAGNOSIS — E782 Mixed hyperlipidemia: Secondary | ICD-10-CM | POA: Diagnosis not present

## 2019-08-07 DIAGNOSIS — K219 Gastro-esophageal reflux disease without esophagitis: Secondary | ICD-10-CM

## 2019-08-07 DIAGNOSIS — N182 Chronic kidney disease, stage 2 (mild): Secondary | ICD-10-CM

## 2019-08-07 MED ORDER — HYDROCHLOROTHIAZIDE 12.5 MG PO TABS: 12.5000 mg | ORAL_TABLET | Freq: Every day | ORAL | 3 refills | Status: DC

## 2019-08-07 MED ORDER — LEVOTHYROXINE SODIUM 88 MCG PO TABS: 88.0000 ug | ORAL_TABLET | Freq: Every day | ORAL | 3 refills | Status: DC

## 2019-08-07 MED ORDER — EZETIMIBE 10 MG PO TABS: 10.0000 mg | ORAL_TABLET | Freq: Every day | ORAL | 3 refills | Status: DC

## 2019-08-07 MED ORDER — LOSARTAN POTASSIUM 50 MG PO TABS: 50.0000 mg | ORAL_TABLET | Freq: Every day | ORAL | 3 refills | Status: DC

## 2019-08-07 MED ORDER — PANTOPRAZOLE SODIUM 40 MG PO TBEC: 40.0000 mg | DELAYED_RELEASE_TABLET | Freq: Every day | ORAL | 3 refills | Status: DC

## 2019-08-07 MED ORDER — ROSUVASTATIN CALCIUM 5 MG PO TABS: ORAL_TABLET | ORAL | 3 refills | Status: DC

## 2019-08-07 NOTE — Assessment & Plan Note (Signed)
Well-controlled HTN - Home BP readings improved control  No known complications     Plan:  1. Continue current BP regimen - Losartan 50mg  daily and HCTZ 12.5mg  daily - refill 2. Encourage improved lifestyle - low sodium diet, regular exercise 3. Continue monitor BP outside office, bring readings to next visit, if persistently >140/90 or new symptoms notify office sooner

## 2019-08-07 NOTE — Patient Instructions (Addendum)
Thank you for coming to the office today.  Refilled all meds to [facility] 90 days with 3 refills.  Increase Rosuvastatin cholesterol low dose to THREE times a week.  For Mammogram screening for breast cancer   Call the [facility] below anytime to schedule your own appointment now that order has been placed.  [facility] [address] [place], [place] [address] Phone: [phone]  ---------  Flu shot today  OPTIONAL 6 months for thyroid if needed.  Colon Cancer Screening: - For all adults age 81+ routine colon cancer screening is highly recommended.     - Recent guidelines from [place] recommend starting age of 49 - Early detection of colon cancer is important, because often there are no warning signs or symptoms, also if found early usually it can be cured. Late stage is hard to treat.  - If you are not interested in Colonoscopy screening (if done and normal you could be cleared for 5 to 10 years until next due), then Cologuard is an excellent alternative for screening test for Colon Cancer. It is highly sensitive for detecting DNA of colon cancer from even the earliest stages. Also, there is NO bowel prep required. - If Cologuard is NEGATIVE, then it is good for 3 years before next due - If Cologuard is POSITIVE, then it is strongly advised to get a Colonoscopy, which allows the GI doctor to locate the source of the cancer or polyp (even very early stage) and treat it by removing it. ------------------------- If you would like to proceed with Cologuard (stool DNA test) - FIRST, call your insurance company and tell them you want to check cost of Cologuard tell them CPT Code [phone] (it may be completely covered and you could get for no cost, OR max cost without any coverage is about $600). Also, keep in mind if you do NOT open the kit, and decide not to do the test, you will NOT be charged, you should contact the company if you decide not to do the  test. - If you want to proceed, you can notify us (phone message, [place], or at next visit) and we will order it for you. The test kit will be delivered to you house within about 1 week. Follow instructions to collect sample, you may call the company for any help or questions, 24/7 telephone support at [phone].   DUE for FASTING BLOOD WORK (no food or drink after midnight before the lab appointment, only water or coffee without cream/sugar on the morning of)  SCHEDULE "Lab Only" visit in the morning at the clinic for lab draw in 1 YEAR  - Make sure Lab Only appointment is at about 1 week before your next appointment, so that results will be available  For Lab Results, once available within 2-3 days of blood draw, you can can log in to MyChart online to view your results and a brief explanation. Also, we can discuss results at next follow-up visit.    Please schedule a Follow-up Appointment to: Return in about 1 year (around 08/06/2020) for Annual Physical.  If you have any other questions or concerns, please feel free to call the office or send a message through [place]. You may also schedule an earlier appointment if necessary.  Additionally, you may be receiving a survey about your experience at our office within a few days to 1 week by e-mail or mail. We value your feedback.  [name], DO [facility], [facility]

## 2019-08-07 NOTE — Assessment & Plan Note (Signed)
Seems elevated LDL now on intermittent statin still Last lipid panel 07/2019 Calculated ASCVD 10 yr risk score elevated >7.5%  Plan: 1. Increase Rosuvastatin 5mg  to 3  X week + Zetia 2. Encourage improved lifestyle - low carb/cholesterol, reduce portion size, continue improving regular exercise

## 2019-08-07 NOTE — Assessment & Plan Note (Signed)
Stable CKD-II to III Not on NSAIDs Improve hydration Continue current med regimen, control HTN

## 2019-08-07 NOTE — Assessment & Plan Note (Signed)
Stable on PPI Esophageal stricture if off PPI Re order med

## 2019-08-07 NOTE — Assessment & Plan Note (Signed)
Stable improved A1c 5.5 Concern with obesity, HTN, HLD  Plan:  1. Not on any therapy currently  2. Encourage improved lifestyle - low carb, low sugar diet, reduce portion size, continue improving regular exercise

## 2019-08-07 NOTE — Assessment & Plan Note (Signed)
Normalized TSH Asymptomatic Controlled on Levothyroxine 58mcg daily

## 2019-08-07 NOTE — Progress Notes (Signed)
Subjective:    Patient ID: Jillian Fritz, female    DOB: [birth year], 68 y.o.   MRN: [id number]  Jillian Fritz is a 68 y.o. female presenting on 08/07/2019 for Annual Exam   HPI  Here for Annual Physical and Lab Review.  CHRONIC HTNwith CKD-III Mild increased Cr again, similar to before. - Today patient reportsstilloverall doing well withhome BP readings controlled Current Meds - Losartan 50mg  daily, HCTZ 12.5mg  daily Tolerating well, w/o complaints. - Still improved edema Not taking NSAIDs  GERD Previously controlled. had esophageal dilatation in past, off PPI worsening, request med refill  History of Heart Murmur Not heard today, she takes preventative antibiotic from dentist before cleaning, no heart procedure or valve issue, can stop or do ECHO  Elevated A1c Previous elevated A1c 5.6, not in range of PreDM. Last lab now is stable at 5.6 Meds:not on meds Currently on ARB Improving low carb diet Limited regular exercise, plans to improve this in future when retire.  HYPERLIPIDEMIA: prior elevated LDL and total cholesterol, previously improved on meds - taking Zetia 10 and Rosuva 5 twice week - last lab showed elevated LDL still  Hypothyroidism: Improved labs TSH now normal, on Levothyroxine 67mcg daily - No significant symptoms  Osteopenia, spine / Vitamin D Deficiency Improved Vitamin D on last lab. Taking supplement.   Health Maintenance:  Colon CA Screening: Last Colonoscopy (done in [place]), results with unsure, good for 5-10 years by her recall. Currently asymptomatic. No known family history of colon CA. Due for screening test considering Cologuard, counseling given  Breast CA Screening - last negative mammogram 10/2017. Due request [place].  Depression screen [facility] 2/9 08/07/2019 01/30/2019 07/30/2018  Decreased Interest 0 0 0  Down, Depressed, Hopeless 0 0 0  PHQ - 2 Score 0 0 0    Past Medical History:  Diagnosis Date  . Asthma   .  GERD (gastroesophageal reflux disease)   . Heart murmur   . Heart murmur    states she has 2 murmurs  . Hypertension   . Hypothyroidism   . Migraines   . Obesity   . Shortness of breath dyspnea    Past Surgical History:  Procedure Laterality Date  . CERVICAL CONE BIOPSY    . ESOPHAGOGASTRODUODENOSCOPY (EGD) WITH PROPOFOL N/A 03/23/2016   Procedure: ESOPHAGOGASTRODUODENOSCOPY (EGD) WITH esophageal dilation. ;  Surgeon: [name], MD;  Location: [facility];  Service: Endoscopy;  Laterality: N/A;  . THROAT SURGERY  04/2014   Social History   Socioeconomic History  . Marital status: Single    Spouse name: Not on file  . Number of children: Not on file  . Years of education: Not on file  . Highest education level: Not on file  Occupational History  . Occupation: Air cabin crew  Social Needs  . Financial resource strain: Not on file  . Food insecurity    Worry: Not on file    Inability: Not on file  . Transportation needs    Medical: Not on file    Non-medical: Not on file  Tobacco Use  . Smoking status: Never Smoker  . Smokeless tobacco: Never Used  Substance and Sexual Activity  . Alcohol use: No    Alcohol/week: 0.0 standard drinks  . Drug use: No  . Sexual activity: Not on file  Lifestyle  . Physical activity    Days per week: Not on file    Minutes per session: Not on file  . Stress: Not on file  Relationships  . Social Herbalist on phone: Not on file    Gets together: Not on file    Attends religious service: Not on file    Active member of club or organization: Not on file    Attends meetings of clubs or organizations: Not on file    Relationship status: Not on file  . Intimate partner violence    Fear of current or ex partner: Not on file    Emotionally abused: Not on file    Physically abused: Not on file    Forced sexual activity: Not on file  Other Topics Concern  . Not on file  Social History Narrative  . Not on file    Family History  Problem Relation Age of Onset  . Alzheimer's disease Father   . Hypertension Mother   . Breast cancer Paternal Aunt        3 pat aunts   Current Outpatient Medications on File Prior to Visit  Medication Sig  . aspirin EC 81 MG tablet Take 1 tablet (81 mg total) by mouth daily.  Marland Kitchen aspirin-acetaminophen-caffeine (EXCEDRIN MIGRAINE) 250-250-65 MG tablet Take 2 tablets by mouth every 6 (six) hours as needed for headache.  . albuterol (PROVENTIL HFA;VENTOLIN HFA) 108 (90 Base) MCG/ACT inhaler Inhale 2 puffs into the lungs every 4 (four) hours as needed for wheezing or shortness of breath (cough). (Patient not taking: Reported on 08/07/2019)   No current facility-administered medications on file prior to visit.     Review of Systems  Constitutional: Negative for activity change, appetite change, chills, diaphoresis, fatigue and fever.  HENT: Negative for congestion and hearing loss.   Eyes: Negative for visual disturbance.  Respiratory: Negative for apnea, cough, choking, chest tightness, shortness of breath and wheezing.   Cardiovascular: Negative for chest pain, palpitations and leg swelling.  Gastrointestinal: Negative for abdominal pain, anal bleeding, blood in stool, constipation, diarrhea, nausea and vomiting.  Endocrine: Negative for cold intolerance.  Genitourinary: Negative for difficulty urinating, dysuria, frequency and hematuria.  Musculoskeletal: Negative for arthralgias, back pain and neck pain.  Skin: Negative for rash.  Allergic/Immunologic: Negative for environmental allergies.  Neurological: Negative for dizziness, weakness, light-headedness, numbness and headaches.  Hematological: Negative for adenopathy.  Psychiatric/Behavioral: Negative for behavioral problems, dysphoric mood and sleep disturbance. The patient is not nervous/anxious.    Per HPI unless specifically indicated above     Objective:    BP 128/77   Pulse 63   Temp 98 F (36.7 C)  (Oral)   Resp 16   Ht 5\' 6"  (1.676 m)   Wt 184 lb (83.5 kg)   BMI 29.70 kg/m   Wt Readings from Last 3 Encounters:  08/07/19 184 lb (83.5 kg)  01/30/19 191 lb (86.6 kg)  10/14/18 190 lb (86.2 kg)    Physical Exam Vitals signs and nursing note reviewed.  Constitutional:      General: She is not in acute distress.    Appearance: She is well-developed. She is not diaphoretic.     Comments: Well-appearing, comfortable, cooperative  HENT:     Head: Normocephalic and atraumatic.  Eyes:     General:        Right eye: No discharge.        Left eye: No discharge.     Conjunctiva/sclera: Conjunctivae normal.     Pupils: Pupils are equal, round, and reactive to light.  Neck:     Musculoskeletal: Normal range of motion and neck supple.  Thyroid: No thyromegaly.  Cardiovascular:     Rate and Rhythm: Normal rate and regular rhythm.     Heart sounds: Normal heart sounds. No murmur.  Pulmonary:     Effort: Pulmonary effort is normal. No respiratory distress.     Breath sounds: Normal breath sounds. No wheezing or rales.  Abdominal:     General: Bowel sounds are normal. There is no distension.     Palpations: Abdomen is soft. There is no mass.     Tenderness: There is no abdominal tenderness.  Musculoskeletal: Normal range of motion.        General: No tenderness.     Comments: Upper / Lower Extremities: - Normal muscle tone, strength bilateral upper extremities 5/5, lower extremities 5/5  Lymphadenopathy:     Cervical: No cervical adenopathy.  Skin:    General: Skin is warm and dry.     Findings: No erythema or rash.  Neurological:     Mental Status: She is alert and oriented to person, place, and time.     Comments: Distal sensation intact to light touch all extremities  Psychiatric:        Behavior: Behavior normal.     Comments: Well groomed, good eye contact, normal speech and thoughts       Recent Labs    01/28/19 1034 08/05/19 1045  HGBA1C 5.6 5.5     Results  for orders placed or performed in visit on 08/03/19  T4, free  Result Value Ref Range   Free T4 1.65 0.82 - 1.77 ng/dL  TSH  Result Value Ref Range   TSH 0.787 0.450 - 4.500 uIU/mL  VITAMIN D 25 Hydroxy (Vit-D Deficiency, Fractures)  Result Value Ref Range   Vit D, 25-Hydroxy 34.2 30.0 - 100.0 ng/mL  Comprehensive Metabolic Panel (CMET)  Result Value Ref Range   Glucose 92 65 - 99 mg/dL   BUN 15 8 - 27 mg/dL   Creatinine, Ser 0.99 0.57 - 1.00 mg/dL   GFR calc non Af Amer 59 (L) >59 mL/min/1.73   GFR calc Af Amer 68 >59 mL/min/1.73   BUN/Creatinine Ratio 15 12 - 28   Sodium 139 134 - 144 mmol/L   Potassium 4.3 3.5 - 5.2 mmol/L   Chloride 101 96 - 106 mmol/L   CO2 23 20 - 29 mmol/L   Calcium 9.9 8.7 - 10.3 mg/dL   Total Protein 6.5 6.0 - 8.5 g/dL   Albumin 4.5 3.8 - 4.8 g/dL   Globulin, Total 2.0 1.5 - 4.5 g/dL   Albumin/Globulin Ratio 2.3 (H) 1.2 - 2.2   Bilirubin Total 0.5 0.0 - 1.2 mg/dL   Alkaline Phosphatase 91 39 - 117 IU/L   AST 20 0 - 40 IU/L   ALT 20 0 - 32 IU/L  CBC with Differential  Result Value Ref Range   WBC 7.5 3.4 - 10.8 x10E3/uL   RBC 4.48 3.77 - 5.28 x10E6/uL   Hemoglobin 13.2 11.1 - 15.9 g/dL   Hematocrit 41.2 34.0 - 46.6 %   MCV 92 79 - 97 fL   MCH 29.5 26.6 - 33.0 pg   MCHC 32.0 31.5 - 35.7 g/dL   RDW 13.6 11.7 - 15.4 %   Platelets 231 150 - 450 x10E3/uL   Neutrophils 51 Not Estab. %   Lymphs 36 Not Estab. %   Monocytes 8 Not Estab. %   Eos 4 Not Estab. %   Basos 1 Not Estab. %   Neutrophils Absolute 3.8 1.4 -  7.0 x10E3/uL   Lymphocytes Absolute 2.7 0.7 - 3.1 x10E3/uL   Monocytes Absolute 0.6 0.1 - 0.9 x10E3/uL   EOS (ABSOLUTE) 0.3 0.0 - 0.4 x10E3/uL   Basophils Absolute 0.1 0.0 - 0.2 x10E3/uL   Immature Granulocytes 0 Not Estab. %   Immature Grans (Abs) 0.0 0.0 - 0.1 x10E3/uL  Hemoglobin A1c  Result Value Ref Range   Hgb A1c MFr Bld 5.5 4.8 - 5.6 %   Est. average glucose Bld gHb Est-mCnc 111 mg/dL  Lipid Profile  Result Value Ref Range    Cholesterol, Total 272 (H) 100 - 199 mg/dL   Triglycerides 124 0 - 149 mg/dL   HDL 69 >39 mg/dL   VLDL Cholesterol Cal 22 5 - 40 mg/dL   LDL Chol Calc (NIH) 181 (H) 0 - 99 mg/dL   Chol/HDL Ratio 3.9 0.0 - 4.4 ratio      Assessment & Plan:   Problem List Items Addressed This Visit    Benign hypertension with CKD (chronic kidney disease), stage II    Well-controlled HTN - Home BP readings improved control  No known complications     Plan:  1. Continue current BP regimen - Losartan 50mg  daily and HCTZ 12.5mg  daily - refill 2. Encourage improved lifestyle - low sodium diet, regular exercise 3. Continue monitor BP outside office, bring readings to next visit, if persistently >140/90 or new symptoms notify office sooner      Relevant Medications   rosuvastatin (CRESTOR) 5 MG tablet   losartan (COZAAR) 50 MG tablet   hydrochlorothiazide (HYDRODIURIL) 12.5 MG tablet   ezetimibe (ZETIA) 10 MG tablet   CKD (chronic kidney disease), stage II    Stable CKD-II to III Not on NSAIDs Improve hydration Continue current med regimen, control HTN      Elevated hemoglobin A1c    Stable improved A1c 5.5 Concern with obesity, HTN, HLD  Plan:  1. Not on any therapy currently  2. Encourage improved lifestyle - low carb, low sugar diet, reduce portion size, continue improving regular exercise      GERD with stricture    Stable on PPI Esophageal stricture if off PPI Re order med      Relevant Medications   pantoprazole (PROTONIX) 40 MG tablet   Hyperlipidemia    Seems elevated LDL now on intermittent statin still Last lipid panel 07/2019 Calculated ASCVD 10 yr risk score elevated >7.5%  Plan: 1. Increase Rosuvastatin 5mg  to 3  X week + Zetia 2. Encourage improved lifestyle - low carb/cholesterol, reduce portion size, continue improving regular exercise      Relevant Medications   rosuvastatin (CRESTOR) 5 MG tablet   losartan (COZAAR) 50 MG tablet   hydrochlorothiazide  (HYDRODIURIL) 12.5 MG tablet   ezetimibe (ZETIA) 10 MG tablet   Hypothyroidism    Normalized TSH Asymptomatic Controlled on Levothyroxine 40mcg daily      Relevant Medications   levothyroxine (SYNTHROID) 88 MCG tablet   Vitamin D deficiency    Other Visit Diagnoses    Annual physical exam    -  Primary   Needs flu shot       Relevant Orders   Flu Vaccine QUAD High Dose(Fluad) (Completed)   Overweight (BMI 25.0-29.9)       Screening for breast cancer       Relevant Orders   MM DIGITAL SCREENING BILATERAL   Screening for colon cancer       Relevant Orders   Cologuard      Updated  Health Maintenance information - Flu shot today - Mammogram in [place] ordered, she will schedule - Cologuard ordered, reviewed benefits / screening info on test Reviewed recent lab results with patient Encouraged improvement to lifestyle with diet and exercise - Goal weight management   Meds ordered this encounter  Medications  . pantoprazole (PROTONIX) 40 MG tablet    Sig: Take 1 tablet (40 mg total) by mouth daily before breakfast.    Dispense:  90 tablet    Refill:  3  . rosuvastatin (CRESTOR) 5 MG tablet    Sig: TAKE 1 TABLET THREE TIMES WEEKLY (90 DAY DOSING FOR 3 TIMES A WEEK )    Dispense:  36 tablet    Refill:  3  . losartan (COZAAR) 50 MG tablet    Sig: Take 1 tablet (50 mg total) by mouth daily.    Dispense:  90 tablet    Refill:  3  . levothyroxine (SYNTHROID) 88 MCG tablet    Sig: Take 1 tablet (88 mcg total) by mouth daily before breakfast.    Dispense:  90 tablet    Refill:  3  . hydrochlorothiazide (HYDRODIURIL) 12.5 MG tablet    Sig: Take 1 tablet (12.5 mg total) by mouth daily.    Dispense:  90 tablet    Refill:  3  . ezetimibe (ZETIA) 10 MG tablet    Sig: Take 1 tablet (10 mg total) by mouth daily.    Dispense:  90 tablet    Refill:  3    Follow up plan: Return in about 1 year (around 08/06/2020) for Annual Physical.  Future labs to be ordered for [facility] in 1  year.  [name], [facility] 08/07/2019, 8:46 AM

## 2019-08-12 ENCOUNTER — Other Ambulatory Visit: Payer: Self-pay

## 2019-08-12 DIAGNOSIS — E782 Mixed hyperlipidemia: Secondary | ICD-10-CM

## 2019-08-12 DIAGNOSIS — K219 Gastro-esophageal reflux disease without esophagitis: Secondary | ICD-10-CM

## 2019-08-12 DIAGNOSIS — N182 Chronic kidney disease, stage 2 (mild): Secondary | ICD-10-CM

## 2019-08-12 DIAGNOSIS — I129 Hypertensive chronic kidney disease with stage 1 through stage 4 chronic kidney disease, or unspecified chronic kidney disease: Secondary | ICD-10-CM

## 2019-08-12 DIAGNOSIS — K222 Esophageal obstruction: Secondary | ICD-10-CM

## 2019-08-12 DIAGNOSIS — E034 Atrophy of thyroid (acquired): Secondary | ICD-10-CM

## 2019-08-12 MED ORDER — EZETIMIBE 10 MG PO TABS: 10.0000 mg | ORAL_TABLET | Freq: Every day | ORAL | 3 refills | Status: DC

## 2019-08-12 MED ORDER — HYDROCHLOROTHIAZIDE 12.5 MG PO TABS: 12.5000 mg | ORAL_TABLET | Freq: Every day | ORAL | 3 refills | Status: DC

## 2019-08-12 MED ORDER — ROSUVASTATIN CALCIUM 5 MG PO TABS: ORAL_TABLET | ORAL | 3 refills | Status: DC

## 2019-08-12 MED ORDER — PANTOPRAZOLE SODIUM 40 MG PO TBEC: 40.0000 mg | DELAYED_RELEASE_TABLET | Freq: Every day | ORAL | 3 refills | Status: DC

## 2019-08-12 MED ORDER — LEVOTHYROXINE SODIUM 88 MCG PO TABS: 88.0000 ug | ORAL_TABLET | Freq: Every day | ORAL | 3 refills | Status: DC

## 2019-08-12 MED ORDER — LOSARTAN POTASSIUM 50 MG PO TABS: 50.0000 mg | ORAL_TABLET | Freq: Every day | ORAL | 3 refills | Status: DC

## 2019-09-14 ENCOUNTER — Other Ambulatory Visit: Payer: Self-pay | Admitting: Family Medicine

## 2019-09-14 DIAGNOSIS — E782 Mixed hyperlipidemia: Secondary | ICD-10-CM

## 2019-09-16 DIAGNOSIS — Z1211 Encounter for screening for malignant neoplasm of colon: Secondary | ICD-10-CM | POA: Diagnosis not present

## 2019-09-16 DIAGNOSIS — Z1212 Encounter for screening for malignant neoplasm of rectum: Secondary | ICD-10-CM | POA: Diagnosis not present

## 2019-09-16 LAB — COLOGUARD: Cologuard: POSITIVE — AB

## 2019-09-17 ENCOUNTER — Other Ambulatory Visit: Payer: Self-pay | Admitting: Family Medicine

## 2019-09-17 DIAGNOSIS — E034 Atrophy of thyroid (acquired): Secondary | ICD-10-CM

## 2019-09-20 ENCOUNTER — Other Ambulatory Visit: Payer: Self-pay | Admitting: Family Medicine

## 2019-09-20 DIAGNOSIS — I129 Hypertensive chronic kidney disease with stage 1 through stage 4 chronic kidney disease, or unspecified chronic kidney disease: Secondary | ICD-10-CM

## 2019-09-23 ENCOUNTER — Encounter: Payer: Self-pay | Admitting: Family Medicine

## 2019-09-23 ENCOUNTER — Telehealth: Payer: Self-pay | Admitting: Family Medicine

## 2019-09-23 DIAGNOSIS — R195 Other fecal abnormalities: Secondary | ICD-10-CM

## 2019-09-23 NOTE — Telephone Encounter (Signed)
Received Cologuard result, Positive.  Called patient discussed positive abnormal result on screening cologuard.  Explained to her that Most of the time we get a positive Cologuard result, usually it is due to a Polyp that is either benign or possibly pre-cancer that is caught very early and is treatable before it ever turns into a problem or cancer.  The next step is a Colonoscopy procedure to look for the abnormality or polyp and remove and treat it. We would offer this by referring you to a GI specialist locally.  Referral to [facility] for diagnostic colonoscopy following abnormal positive cologuard test results 09/23/19. Asymptomatic, prior colonoscopy >5 years ago in [place], did not have record available.   [name], DO [facility] 09/23/2019, 6:08 PM

## 2019-09-24 ENCOUNTER — Telehealth: Payer: Self-pay

## 2019-09-24 ENCOUNTER — Other Ambulatory Visit: Payer: Self-pay

## 2019-09-24 DIAGNOSIS — Z1211 Encounter for screening for malignant neoplasm of colon: Secondary | ICD-10-CM

## 2019-09-24 DIAGNOSIS — R195 Other fecal abnormalities: Secondary | ICD-10-CM

## 2019-09-24 NOTE — Telephone Encounter (Signed)
Gastroenterology Pre-Procedure Review  Request Date: Friday 10/09/19 Requesting Physician: Dr. Allen Norris  PATIENT REVIEW QUESTIONS: The patient responded to the following health history questions as indicated:    1. Are you having any GI issues? no Positive Cologuard Test 2. Do you have a personal history of Polyps? no 3. Do you have a family history of Colon Cancer or Polyps? no 4. Diabetes Mellitus? no 5. Joint replacements in the past 12 months?No 6. Major health problems in the past 3 months?finger surgery March 2020 7. Any artificial heart valves, MVP, or defibrillator?no    MEDICATIONS & ALLERGIES:    Patient reports the following regarding taking any anticoagulation/antiplatelet therapy:   Plavix, Coumadin, Eliquis, Xarelto, Lovenox, Pradaxa, Brilinta, or Effient? no Aspirin? yes (81 mg daily)  Patient confirms/reports the following medications:  Current Outpatient Medications  Medication Sig Dispense Refill  . albuterol (PROVENTIL HFA;VENTOLIN HFA) 108 (90 Base) MCG/ACT inhaler Inhale 2 puffs into the lungs every 4 (four) hours as needed for wheezing or shortness of breath (cough). (Patient not taking: Reported on 08/07/2019) 1 Inhaler 1  . aspirin EC 81 MG tablet Take 1 tablet (81 mg total) by mouth daily.    Marland Kitchen aspirin-acetaminophen-caffeine (EXCEDRIN MIGRAINE) 250-250-65 MG tablet Take 2 tablets by mouth every 6 (six) hours as needed for headache.    . ezetimibe (ZETIA) 10 MG tablet TAKE 1 TABLET BY MOUTH EVERY DAY 90 tablet 0  . hydrochlorothiazide (HYDRODIURIL) 12.5 MG tablet Take 1 tablet (12.5 mg total) by mouth daily. 90 tablet 3  . levothyroxine (SYNTHROID) 88 MCG tablet TAKE 1 TABLET (88 MCG TOTAL) BY MOUTH DAILY BEFORE BREAKFAST. 90 tablet 1  . losartan (COZAAR) 50 MG tablet TAKE 1 TABLET BY MOUTH EVERY DAY 90 tablet 1  . pantoprazole (PROTONIX) 40 MG tablet Take 1 tablet (40 mg total) by mouth daily before breakfast. 90 tablet 3  . rosuvastatin (CRESTOR) 5 MG tablet TAKE 1  TABLET THREE TIMES WEEKLY (90 DAY DOSING FOR 3 TIMES A WEEK ) 36 tablet 3   No current facility-administered medications for this visit.     Patient confirms/reports the following allergies:  Allergies  Allergen Reactions  . Statins Swelling  . Lisinopril Cough  . Penicillins   . Sulfa Antibiotics     No orders of the defined types were placed in this encounter.   AUTHORIZATION INFORMATION Primary Insurance: 1D#: Group #:  Secondary Insurance: 1D#: Group #:  SCHEDULE INFORMATION: Date: Friday 10/09/19 Time: Location:[facility]

## 2019-09-30 ENCOUNTER — Other Ambulatory Visit: Payer: Self-pay

## 2019-09-30 ENCOUNTER — Encounter: Payer: Self-pay | Admitting: *Deleted

## 2019-10-01 ENCOUNTER — Encounter: Payer: Self-pay | Admitting: Anesthesiology

## 2019-10-02 ENCOUNTER — Telehealth: Payer: Self-pay | Admitting: Gastroenterology

## 2019-10-02 NOTE — Telephone Encounter (Signed)
Patient called & to cancel her colonoscopy on 10-09-19 scheduled with Dr Allen Norris. Her mother is in ICU.She will call later to r/s.

## 2019-10-02 NOTE — Telephone Encounter (Signed)
Patients colonoscopy has been canceled due to mothers illness. She will call back to reschedule at a later time.  Thanks [employer]

## 2019-10-06 ENCOUNTER — Other Ambulatory Visit: Payer: Medicare Other

## 2019-10-09 ENCOUNTER — Ambulatory Visit: Admission: RE | Admit: 2019-10-09 | Payer: Medicare Other | Source: Home / Self Care | Admitting: Gastroenterology

## 2019-10-09 HISTORY — DX: Presence of spectacles and contact lenses: Z97.3

## 2019-10-09 HISTORY — DX: Motion sickness, initial encounter: T75.3XXA

## 2019-10-09 HISTORY — DX: Presence of dental prosthetic device (complete) (partial): Z97.2

## 2019-10-09 SURGERY — COLONOSCOPY WITH PROPOFOL
Anesthesia: Choice

## 2020-03-14 DIAGNOSIS — E034 Atrophy of thyroid (acquired): Secondary | ICD-10-CM

## 2020-03-14 DIAGNOSIS — N182 Chronic kidney disease, stage 2 (mild): Secondary | ICD-10-CM

## 2020-03-14 DIAGNOSIS — I129 Hypertensive chronic kidney disease with stage 1 through stage 4 chronic kidney disease, or unspecified chronic kidney disease: Secondary | ICD-10-CM

## 2020-03-14 MED ORDER — LOSARTAN POTASSIUM 50 MG PO TABS: 50.0000 mg | ORAL_TABLET | Freq: Every day | ORAL | 1 refills | Status: DC

## 2020-03-15 MED ORDER — LEVOTHYROXINE SODIUM 88 MCG PO TABS: 88.0000 ug | ORAL_TABLET | Freq: Every day | ORAL | 3 refills | Status: DC

## 2020-03-15 NOTE — Addendum Note (Signed)
Addended by: Olin Hauser on: 03/15/2020 08:47 AM   Modules accepted: Orders

## 2020-03-23 ENCOUNTER — Telehealth: Payer: Self-pay | Admitting: Gastroenterology

## 2020-03-23 NOTE — Telephone Encounter (Signed)
Patient called & l/m to r/s her procedure with Dr Allen Norris.

## 2020-03-29 ENCOUNTER — Encounter: Payer: Self-pay | Admitting: *Deleted

## 2020-03-29 ENCOUNTER — Other Ambulatory Visit: Payer: Self-pay

## 2020-03-29 ENCOUNTER — Telehealth (INDEPENDENT_AMBULATORY_CARE_PROVIDER_SITE_OTHER): Payer: Self-pay | Admitting: Gastroenterology

## 2020-03-29 DIAGNOSIS — R195 Other fecal abnormalities: Secondary | ICD-10-CM

## 2020-04-06 ENCOUNTER — Encounter: Payer: Self-pay | Admitting: Gastroenterology

## 2020-04-06 ENCOUNTER — Other Ambulatory Visit: Payer: Self-pay

## 2020-04-12 ENCOUNTER — Other Ambulatory Visit
Admission: RE | Admit: 2020-04-12 | Discharge: 2020-04-12 | Disposition: A | Discharge status: Home / Self Care | Payer: Medicare Other | Source: Ambulatory Visit | Attending: Gastroenterology | Admitting: Gastroenterology

## 2020-04-12 DIAGNOSIS — Z20822 Contact with and (suspected) exposure to covid-19: Secondary | ICD-10-CM | POA: Diagnosis not present

## 2020-04-12 DIAGNOSIS — Z01812 Encounter for preprocedural laboratory examination: Secondary | ICD-10-CM | POA: Diagnosis not present

## 2020-04-12 LAB — SARS CORONAVIRUS 2 (TAT 6-24 HRS): SARS Coronavirus 2: NEGATIVE

## 2020-04-13 NOTE — Discharge Instructions (Signed)
General Anesthesia, Adult, Care After This sheet gives you information about how to care for yourself after your procedure. Your health care provider may also give you more specific instructions. If you have problems or questions, contact your health care provider. What can I expect after the procedure? After the procedure, the following side effects are common:  Pain or discomfort at the IV site.  Nausea.  Vomiting.  Sore throat.  Trouble concentrating.  Feeling cold or chills.  Weak or tired.  Sleepiness and fatigue.  Soreness and body aches. These side effects can affect parts of the body that were not involved in surgery. Follow these instructions at home:  For at least 24 hours after the procedure:  Have a responsible adult stay with you. It is important to have someone help care for you until you are awake and alert.  Rest as needed.  Do not: ? Participate in activities in which you could fall or become injured. ? Drive. ? Use heavy machinery. ? Drink alcohol. ? Take sleeping pills or medicines that cause drowsiness. ? Make important decisions or sign legal documents. ? Take care of children on your own. Eating and drinking  Follow any instructions from your health care provider about eating or drinking restrictions.  When you feel hungry, start by eating small amounts of foods that are soft and easy to digest (bland), such as toast. Gradually return to your regular diet.  Drink enough fluid to keep your urine pale yellow.  If you vomit, rehydrate by drinking water, juice, or clear broth. General instructions  If you have sleep apnea, surgery and certain medicines can increase your risk for breathing problems. Follow instructions from your health care provider about wearing your sleep device: ? Anytime you are sleeping, including during daytime naps. ? While taking prescription pain medicines, sleeping medicines, or medicines that make you drowsy.  Return to  your normal activities as told by your health care provider. Ask your health care provider what activities are safe for you.  Take over-the-counter and prescription medicines only as told by your health care provider.  If you smoke, do not smoke without supervision.  Keep all follow-up visits as told by your health care provider. This is important. Contact a health care provider if:  You have nausea or vomiting that does not get better with medicine.  You cannot eat or drink without vomiting.  You have pain that does not get better with medicine.  You are unable to pass urine.  You develop a skin rash.  You have a fever.  You have redness around your IV site that gets worse. Get help right away if:  You have difficulty breathing.  You have chest pain.  You have blood in your urine or stool, or you vomit blood. Summary  After the procedure, it is common to have a sore throat or nausea. It is also common to feel tired.  Have a responsible adult stay with you for the first 24 hours after general anesthesia. It is important to have someone help care for you until you are awake and alert.  When you feel hungry, start by eating small amounts of foods that are soft and easy to digest (bland), such as toast. Gradually return to your regular diet.  Drink enough fluid to keep your urine pale yellow.  Return to your normal activities as told by your health care provider. Ask your health care provider what activities are safe for you. This information is not   intended to replace advice given to you by your health care provider. Make sure you discuss any questions you have with your health care provider. Document Revised: 11/22/2017 Document Reviewed: 07/05/2017 Elsevier Patient Education  2020 [employer]  

## 2020-04-14 ENCOUNTER — Ambulatory Visit
Admission: RE | Admit: 2020-04-14 | Discharge: 2020-04-14 | Disposition: A | Discharge status: Home / Self Care | Payer: Medicare Other | Attending: Gastroenterology | Admitting: Gastroenterology

## 2020-04-14 ENCOUNTER — Encounter: Payer: Self-pay | Admitting: Gastroenterology

## 2020-04-14 ENCOUNTER — Encounter
Admission: RE | Disposition: A | Discharge status: Home / Self Care | Payer: Self-pay | Source: Home / Self Care | Attending: Gastroenterology

## 2020-04-14 ENCOUNTER — Ambulatory Visit: Payer: Medicare Other | Admitting: Anesthesiology

## 2020-04-14 ENCOUNTER — Other Ambulatory Visit: Payer: Self-pay

## 2020-04-14 DIAGNOSIS — Z8249 Family history of ischemic heart disease and other diseases of the circulatory system: Secondary | ICD-10-CM | POA: Diagnosis not present

## 2020-04-14 DIAGNOSIS — J45909 Unspecified asthma, uncomplicated: Secondary | ICD-10-CM | POA: Insufficient documentation

## 2020-04-14 DIAGNOSIS — K5730 Diverticulosis of large intestine without perforation or abscess without bleeding: Secondary | ICD-10-CM | POA: Insufficient documentation

## 2020-04-14 DIAGNOSIS — K648 Other hemorrhoids: Secondary | ICD-10-CM | POA: Insufficient documentation

## 2020-04-14 DIAGNOSIS — Z7982 Long term (current) use of aspirin: Secondary | ICD-10-CM | POA: Diagnosis not present

## 2020-04-14 DIAGNOSIS — Z79890 Hormone replacement therapy (postmenopausal): Secondary | ICD-10-CM | POA: Diagnosis not present

## 2020-04-14 DIAGNOSIS — Z79899 Other long term (current) drug therapy: Secondary | ICD-10-CM | POA: Insufficient documentation

## 2020-04-14 DIAGNOSIS — E039 Hypothyroidism, unspecified: Secondary | ICD-10-CM | POA: Insufficient documentation

## 2020-04-14 DIAGNOSIS — D122 Benign neoplasm of ascending colon: Secondary | ICD-10-CM | POA: Insufficient documentation

## 2020-04-14 DIAGNOSIS — Z882 Allergy status to sulfonamides status: Secondary | ICD-10-CM | POA: Diagnosis not present

## 2020-04-14 DIAGNOSIS — R195 Other fecal abnormalities: Secondary | ICD-10-CM

## 2020-04-14 DIAGNOSIS — D126 Benign neoplasm of colon, unspecified: Secondary | ICD-10-CM | POA: Diagnosis not present

## 2020-04-14 DIAGNOSIS — Z888 Allergy status to other drugs, medicaments and biological substances status: Secondary | ICD-10-CM | POA: Diagnosis not present

## 2020-04-14 DIAGNOSIS — K635 Polyp of colon: Secondary | ICD-10-CM | POA: Diagnosis not present

## 2020-04-14 DIAGNOSIS — M1990 Unspecified osteoarthritis, unspecified site: Secondary | ICD-10-CM | POA: Diagnosis not present

## 2020-04-14 DIAGNOSIS — K219 Gastro-esophageal reflux disease without esophagitis: Secondary | ICD-10-CM | POA: Insufficient documentation

## 2020-04-14 DIAGNOSIS — E669 Obesity, unspecified: Secondary | ICD-10-CM | POA: Insufficient documentation

## 2020-04-14 DIAGNOSIS — Z880 Allergy status to penicillin: Secondary | ICD-10-CM | POA: Diagnosis not present

## 2020-04-14 DIAGNOSIS — I10 Essential (primary) hypertension: Secondary | ICD-10-CM | POA: Insufficient documentation

## 2020-04-14 DIAGNOSIS — Z6829 Body mass index (BMI) 29.0-29.9, adult: Secondary | ICD-10-CM | POA: Insufficient documentation

## 2020-04-14 HISTORY — PX: COLONOSCOPY WITH PROPOFOL: SHX5780

## 2020-04-14 HISTORY — PX: POLYPECTOMY: SHX5525

## 2020-04-14 HISTORY — DX: Unspecified osteoarthritis, unspecified site: M19.90

## 2020-04-14 SURGERY — COLONOSCOPY WITH PROPOFOL
Anesthesia: General | Site: Rectum

## 2020-04-14 MED ORDER — PROPOFOL 10 MG/ML IV BOLUS
INTRAVENOUS | Status: DC | PRN
  Administered 2020-04-14: 130 mg via INTRAVENOUS
  Administered 2020-04-14 (×5): 40 mg via INTRAVENOUS

## 2020-04-14 MED ORDER — STERILE WATER FOR IRRIGATION IR SOLN
Status: DC | PRN
  Administered 2020-04-14: 50 mL

## 2020-04-14 MED ORDER — LACTATED RINGERS IV SOLN: INTRAVENOUS | Status: DC

## 2020-04-14 MED ORDER — LIDOCAINE HCL (CARDIAC) PF 100 MG/5ML IV SOSY
PREFILLED_SYRINGE | INTRAVENOUS | Status: DC | PRN
  Administered 2020-04-14: 30 mg via INTRAVENOUS

## 2020-04-14 SURGICAL SUPPLY — 16 items
CLIP HMST 235XBRD CATH ROT (MISCELLANEOUS) IMPLANT
CLIP RESOLUTION 360 11X235 (MISCELLANEOUS) ×2
ELECT REM PT RETURN 9FT ADLT (ELECTROSURGICAL) ×2
ELECTRODE REM PT RTRN 9FT ADLT (ELECTROSURGICAL) IMPLANT
ELEVIEW  INJECTABLE COMP 10 (MISCELLANEOUS) ×1
GOWN CVR UNV OPN BCK APRN NK (MISCELLANEOUS) ×2 IMPLANT
GOWN ISOL THUMB LOOP REG UNIV (MISCELLANEOUS) ×2
INJECTABLE ELEVIEW COMP 10 (MISCELLANEOUS) IMPLANT
INJECTOR VARIJECT VIN23 (MISCELLANEOUS) IMPLANT
KIT ENDO PROCEDURE OLY (KITS) ×2 IMPLANT
MANIFOLD NEPTUNE II (INSTRUMENTS) ×1 IMPLANT
RETRIEVER NET ROTH 2.5X230 LF (MISCELLANEOUS) ×1 IMPLANT
SNARE SHORT THROW 13M SML OVAL (MISCELLANEOUS) ×1 IMPLANT
TRAP ETRAP POLY (MISCELLANEOUS) ×1 IMPLANT
VARIJECT INJECTOR VIN23 (MISCELLANEOUS) ×2
WATER STERILE IRR 250ML POUR (IV SOLUTION) ×2 IMPLANT

## 2020-04-14 NOTE — Progress Notes (Signed)
Error

## 2020-04-14 NOTE — H&P (Signed)
[name], MD [facility] [address], [place], [place] [address] Phone:[phone] Fax : [phone]  Primary Care Physician:  Olin Hauser, DO Primary Gastroenterologist:  Dr. Allen Norris  Pre-Procedure History & Physical: HPI:  Jillian Fritz is a 69 y.o. female is here for an colonoscopy.   Past Medical History:  Diagnosis Date  . Arthritis    knuckles  . Asthma    childhood - exercise induced  . Dental bridge present    flexible, removable, upper, fits snuggly  . GERD (gastroesophageal reflux disease)   . Heart murmur    states she has 2 murmurs/no issues except when pregnant  . Hypertension    controlled on meds  . Hypothyroidism   . Migraines    2 or 3 years ago  . Motion sickness    cars, planes  . Obesity   . Shortness of breath dyspnea   . Wears contact lenses     Past Surgical History:  Procedure Laterality Date  . CERVICAL CONE BIOPSY    . ESOPHAGOGASTRODUODENOSCOPY (EGD) WITH PROPOFOL N/A 03/23/2016   Procedure: ESOPHAGOGASTRODUODENOSCOPY (EGD) WITH esophageal dilation. ;  Surgeon: [name], MD;  Location: [facility];  Service: Endoscopy;  Laterality: N/A;  . THROAT SURGERY  04/2014    Prior to Admission medications   Medication Sig Start Date End Date Taking? Authorizing Provider  aspirin EC 81 MG tablet Take 1 tablet (81 mg total) by mouth daily. 05/31/17  Yes Karamalegos, Devonne Doughty, DO  Cholecalciferol (VITAMIN D3) 50 MCG (2000 UT) TABS Take by mouth.   Yes [provider]  Coenzyme Q10 (COQ10) 100 MG CAPS Take by mouth.   Yes [provider]  ezetimibe (ZETIA) 10 MG tablet TAKE 1 TABLET BY MOUTH EVERY DAY 09/15/19  Yes Mikey College, NP  hydrochlorothiazide (HYDRODIURIL) 12.5 MG tablet Take 1 tablet (12.5 mg total) by mouth daily. 08/12/19  Yes Karamalegos, Devonne Doughty, DO  levothyroxine (SYNTHROID) 88 MCG tablet Take 1 tablet (88 mcg total) by mouth daily before breakfast. 03/15/20  Yes  Karamalegos, Devonne Doughty, DO  losartan (COZAAR) 50 MG tablet Take 1 tablet (50 mg total) by mouth daily. 03/14/20  Yes Karamalegos, Devonne Doughty, DO  pantoprazole (PROTONIX) 40 MG tablet Take 1 tablet (40 mg total) by mouth daily before breakfast. 08/12/19  Yes Karamalegos, Devonne Doughty, DO  Probiotic Product (PROBIOTIC DAILY PO) Take by mouth daily.   Yes [provider]  albuterol (PROVENTIL HFA;VENTOLIN HFA) 108 (90 Base) MCG/ACT inhaler Inhale 2 puffs into the lungs every 4 (four) hours as needed for wheezing or shortness of breath (cough). Patient not taking: Reported on 08/07/2019 12/31/16   Olin Hauser, DO  aspirin-acetaminophen-caffeine (EXCEDRIN MIGRAINE) [phone] MG tablet Take 2 tablets by mouth every 6 (six) hours as needed for headache.    [provider]  rosuvastatin (CRESTOR) 5 MG tablet TAKE 1 TABLET THREE TIMES WEEKLY (90 DAY DOSING FOR 3 TIMES A WEEK ) Patient not taking: Reported on 04/06/2020 08/12/19   Olin Hauser, DO    Allergies as of 03/29/2020 - Review Complete 09/30/2019  Allergen Reaction Noted  . Penicillins Anaphylaxis and Hives 12/05/2015  . Sulfa antibiotics Anaphylaxis and Hives 12/05/2015  . Statins Swelling 01/17/2016  . Dill oil Nausea And Vomiting 09/30/2019  . Lisinopril Cough 05/22/2016  . Peach [prunus persica] Hives 09/30/2019  . Strawberry (diagnostic) Hives 09/30/2019    Family History  Problem Relation Age of Onset  . Alzheimer's disease Father   .  Hypertension Mother   . Breast cancer Paternal Aunt        3 pat aunts    Social History   Socioeconomic History  . Marital status: Single    Spouse name: Not on file  . Number of children: Not on file  . Years of education: Not on file  . Highest education level: Not on file  Occupational History  . Occupation: [employer]  Tobacco Use  . Smoking status: Never Smoker  . Smokeless tobacco: Never Used  Substance and Sexual Activity  .  Alcohol use: No    Alcohol/week: 0.0 standard drinks  . Drug use: No  . Sexual activity: Not on file  Other Topics Concern  . Not on file  Social History Narrative  . Not on file   Social Determinants of Health   Financial Resource Strain:   . Difficulty of Paying Living Expenses:   Food Insecurity:   . Worried About Charity fundraiser in the Last Year:   . Arboriculturist in the Last Year:   Transportation Needs:   . Film/video editor (Medical):   Marland Kitchen Lack of Transportation (Non-Medical):   Physical Activity:   . Days of Exercise per Week:   . Minutes of Exercise per Session:   Stress:   . Feeling of Stress :   Social Connections:   . Frequency of Communication with Friends and Family:   . Frequency of Social Gatherings with Friends and Family:   . Attends Religious Services:   . Active Member of Clubs or Organizations:   . Attends Archivist Meetings:   Marland Kitchen Marital Status:   Intimate Partner Violence:   . Fear of Current or Ex-Partner:   . Emotionally Abused:   Marland Kitchen Physically Abused:   . Sexually Abused:     Review of Systems: See HPI, otherwise negative ROS  Physical Exam: BP (!) 143/78   Pulse 85   Temp (!) 97.5 F (36.4 C) (Temporal)   Resp 16   Ht 5\' 5"  (1.651 m)   Wt 80.7 kg   SpO2 98%   BMI 29.62 kg/m  General:   Alert,  pleasant and cooperative in NAD Head:  Normocephalic and atraumatic. Neck:  Supple; no masses or thyromegaly. Lungs:  Clear throughout to auscultation.    Heart:  Regular rate and rhythm. Abdomen:  Soft, nontender and nondistended. Normal bowel sounds, without guarding, and without rebound.   Neurologic:  Alert and  oriented x4;  grossly normal neurologically.  Impression/Plan: Jillian Fritz is here for an colonoscopy to be performed for positive cologard  Risks, benefits, limitations, and alternatives regarding  colonoscopy have been reviewed with the patient.  Questions have been answered.  All parties  agreeable.   [name], MD  04/14/2020, 7:49 AM

## 2020-04-14 NOTE — Op Note (Addendum)
[facility] Gastroenterology Patient Name: Jillian Fritz Procedure Date: 04/14/2020 8:27 AM MRN: [id number] Account #: [id number] Date of Birth: Jul 29, 1951 Admit Type: Outpatient Age: 69 Room: [facility] OR ROOM 01 Gender: Female Note Status: Finalized Procedure:             Colonoscopy Indications:           Positive Cologuard test Providers:             [name] MD, MD Referring MD:          Olin Hauser (Referring MD) Medicines:             Propofol per Anesthesia Complications:         No immediate complications. Procedure:             Pre-Anesthesia Assessment:                        - Prior to the procedure, a History and Physical was                         performed, and patient medications and allergies were                         reviewed. The patient's tolerance of previous                         anesthesia was also reviewed. The risks and benefits                         of the procedure and the sedation options and risks                         were discussed with the patient. All questions were                         answered, and informed consent was obtained. Prior                         Anticoagulants: The patient has taken no previous                         anticoagulant or antiplatelet agents. ASA Grade                         Assessment: II - A patient with mild systemic disease.                         After reviewing the risks and benefits, the patient                         was deemed in satisfactory condition to undergo the                         procedure.                        After obtaining informed consent, the colonoscope was  passed under direct vision. Throughout the procedure,                         the patient's blood pressure, pulse, and oxygen                         saturations were monitored continuously. The                         Colonoscope was introduced through the anus and                          advanced to the the cecum, identified by appendiceal                         orifice and ileocecal valve. The colonoscopy was                         performed without difficulty. The patient tolerated                         the procedure well. The quality of the bowel                         preparation was excellent. Findings:      The perianal and digital rectal examinations were normal.      A 10 mm polyp was found in the ascending colon. The polyp was sessile.       Area was successfully injected with 4 mL saline with indigo carmine for       a lift polypectomy. The polyp was removed with a hot snare. Resection       and retrieval were complete. To prevent bleeding post-intervention, two       hemostatic clips were successfully placed (MR conditional). There was no       bleeding at the end of the procedure.      A few small-mouthed diverticula were found in the entire colon.      Non-bleeding internal hemorrhoids were found during retroflexion. The       hemorrhoids were Grade I (internal hemorrhoids that do not prolapse). Impression:            - One 10 mm polyp in the ascending colon, removed with                         a hot snare. Resected and retrieved. Injected. Clips                         (MR conditional) were placed.                        - Diverticulosis in the entire examined colon.                        - Non-bleeding internal hemorrhoids. Recommendation:        - Discharge patient to home.                        - Resume previous diet.                        -  Continue present medications.                        - Await pathology results.                        - Repeat colonoscopy in 5 years if polyp adenoma and                         10 years if hyperplastic Procedure Code(s):     --- Professional ---                        [phone], Colonoscopy, flexible; with removal of                         tumor(s), polyp(s), or other lesion(s) by snare                          technique                        [id number], Colonoscopy, flexible; with directed submucosal                         injection(s), any substance Diagnosis Code(s):     --- Professional ---                        R19.5, Other fecal abnormalities                        K63.5, Polyp of colon CPT copyright 2019 American Medical Association. All rights reserved. The codes documented in this report are preliminary and upon coder review may  be revised to meet current compliance requirements. [name] MD, MD 04/14/2020 8:58:25 AM This report has been signed electronically. Number of Addenda: 0 Note Initiated On: 04/14/2020 8:27 AM Scope Withdrawal Time: 0 hours 9 minutes 57 seconds  Total Procedure Duration: 0 hours 14 minutes 7 seconds  Estimated Blood Loss:  Estimated blood loss: none.      [facility]

## 2020-04-14 NOTE — Transfer of Care (Signed)
Immediate Anesthesia Transfer of Care Note  Patient: Jillian Fritz  Procedure(s) Performed: COLONOSCOPY WITH BIOPSY (N/A Rectum) POLYPECTOMY (N/A Rectum)  Patient Location: PACU  Anesthesia Type: General  Level of Consciousness: awake, alert  and patient cooperative  Airway and Oxygen Therapy: Patient Spontanous Breathing and Patient connected to supplemental oxygen  Post-op Assessment: Post-op Vital signs reviewed, Patient's Cardiovascular Status Stable, Respiratory Function Stable, Patent Airway and No signs of Nausea or vomiting  Post-op Vital Signs: Reviewed and stable  Complications: No apparent anesthesia complications

## 2020-04-14 NOTE — Anesthesia Preprocedure Evaluation (Signed)
Anesthesia Evaluation  Patient identified by MRN, date of birth, ID band Patient awake    Reviewed: Allergy & Precautions, H&P , NPO status , Patient's Chart, lab work & pertinent test results  Airway Mallampati: I  TM Distance: >3 FB Neck ROM: full    Dental no notable dental hx.    Pulmonary shortness of breath, asthma ,    Pulmonary exam normal        Cardiovascular hypertension, Normal cardiovascular exam+ Valvular Problems/Murmurs  Rhythm:regular Rate:Normal     Neuro/Psych  Headaches,    GI/Hepatic Neg liver ROS, Medicated,  Endo/Other  Hypothyroidism   Renal/GU      Musculoskeletal   Abdominal   Peds  Hematology negative hematology ROS (+)   Anesthesia Other Findings   Reproductive/Obstetrics negative OB ROS                             Anesthesia Physical Anesthesia Plan  ASA: II  Anesthesia Plan: General   Post-op Pain Management:    Induction:   PONV Risk Score and Plan:   Airway Management Planned:   Additional Equipment:   Intra-op Plan:   Post-operative Plan:   Informed Consent: I have reviewed the patients History and Physical, chart, labs and discussed the procedure including the risks, benefits and alternatives for the proposed anesthesia with the patient or authorized representative who has indicated his/her understanding and acceptance.       Plan Discussed with:   Anesthesia Plan Comments:         Anesthesia Quick Evaluation

## 2020-04-14 NOTE — Anesthesia Postprocedure Evaluation (Signed)
Anesthesia Post Note  Patient: Jillian Fritz  Procedure(s) Performed: COLONOSCOPY WITH BIOPSY (N/A Rectum) POLYPECTOMY (N/A Rectum)     Patient location during evaluation: PACU Anesthesia Type: General Level of consciousness: awake and alert Pain management: pain level controlled Vital Signs Assessment: post-procedure vital signs reviewed and stable Respiratory status: spontaneous breathing Cardiovascular status: stable Anesthetic complications: no    Elham Fini, III,  Desi Carby D

## 2020-04-14 NOTE — Anesthesia Procedure Notes (Signed)
Performed by: Alexande Sheerin, CRNA Pre-anesthesia Checklist: Patient identified, Emergency Drugs available, Suction available, Timeout performed and Patient being monitored Patient Re-evaluated:Patient Re-evaluated prior to induction Oxygen Delivery Method: Nasal cannula Placement Confirmation: positive ETCO2       

## 2020-04-15 ENCOUNTER — Encounter: Payer: Self-pay | Admitting: *Deleted

## 2020-04-15 LAB — SURGICAL PATHOLOGY

## 2020-04-19 ENCOUNTER — Telehealth: Payer: Self-pay

## 2020-04-19 NOTE — Telephone Encounter (Signed)
Pt notified of information below. Pt stated the rash is going away. I have updated her allergy list.

## 2020-04-19 NOTE — Telephone Encounter (Signed)
Pt notified of results. Added to recall list. 

## 2020-04-19 NOTE — Telephone Encounter (Signed)
-----   Message from [name], MD sent at 04/17/2020 10:35 AM EDT ----- Because of the pathology and size, she needs a repeat colonoscopy in 3 years not 5.

## 2020-04-19 NOTE — Telephone Encounter (Signed)
-----   Message from [name], MD sent at 04/19/2020 11:23 AM EDT ----- That is unusual and I agree with taking Benadryl.  If it is a reaction to something she got which was only recorded to be propofol then this should go away.  If she has any trouble breathing she should go to the ER. ----- Message ----- From: Glennie Isle, CMA Sent: 04/19/2020  10:35 AM EDT To: [name], MD  Pt stated she developed a rash that evening after her procedure. I told her the only thing I know gets administered is the Propofol. I told her I would ask if she is given anything else that could have cause the rash all over her body. She is taking benadryl. Is she given anything else that I can make her aware of?

## 2020-04-22 ENCOUNTER — Other Ambulatory Visit: Payer: Self-pay | Admitting: Family Medicine

## 2020-04-22 DIAGNOSIS — K219 Gastro-esophageal reflux disease without esophagitis: Secondary | ICD-10-CM

## 2020-04-22 MED ORDER — PANTOPRAZOLE SODIUM 40 MG PO TBEC: 40.0000 mg | DELAYED_RELEASE_TABLET | Freq: Every day | ORAL | 3 refills | Status: DC

## 2020-05-11 ENCOUNTER — Other Ambulatory Visit: Payer: Self-pay | Admitting: Family Medicine

## 2020-05-11 DIAGNOSIS — E782 Mixed hyperlipidemia: Secondary | ICD-10-CM

## 2020-05-14 ENCOUNTER — Other Ambulatory Visit: Payer: Self-pay | Admitting: Family Medicine

## 2020-05-14 DIAGNOSIS — E782 Mixed hyperlipidemia: Secondary | ICD-10-CM

## 2020-05-14 NOTE — Telephone Encounter (Signed)
Requested Prescriptions  Pending Prescriptions Disp Refills   ezetimibe (ZETIA) 10 MG tablet [Pharmacy Med Name: EZETIMIBE  10MG   TAB] 90 tablet 3    Sig: TAKE 1 TABLET BY MOUTH  DAILY     Cardiovascular:  Antilipid - Sterol Transport Inhibitors Failed - 05/14/2020 11:45 PM      Failed - Total Cholesterol in normal range and within 360 days    Cholesterol, Total  Date Value Ref Range Status  08/05/2019 272 (H) 100 - 199 mg/dL Final         Failed - LDL in normal range and within 360 days    LDL Chol Calc (NIH)  Date Value Ref Range Status  08/05/2019 181 (H) 0 - 99 mg/dL Final         Failed - Valid encounter within last 12 months    Recent Outpatient Visits          9 months ago Annual physical exam   [place], Devonne Doughty, DO   1 year ago Hypothyroidism due to acquired atrophy of thyroid   [place], Devonne Doughty, DO   1 year ago Annual physical exam   [facility] Olin Hauser, DO   2 years ago Essential hypertension   [place], Devonne Doughty, DO   2 years ago Essential hypertension   [place], DO             Passed - HDL in normal range and within 360 days    HDL  Date Value Ref Range Status  08/05/2019 69 >39 mg/dL Final         Passed - Triglycerides in normal range and within 360 days    Triglycerides  Date Value Ref Range Status  08/05/2019 124 0 - 149 mg/dL Final

## 2020-06-20 ENCOUNTER — Telehealth: Payer: Self-pay | Admitting: Family Medicine

## 2020-06-20 ENCOUNTER — Other Ambulatory Visit: Payer: Self-pay | Admitting: Family Medicine

## 2020-06-20 DIAGNOSIS — Z1231 Encounter for screening mammogram for malignant neoplasm of breast: Secondary | ICD-10-CM

## 2020-06-20 NOTE — Telephone Encounter (Addendum)
No appointment needed. She only needs screening mammogram order. Last visit was 08/2019, she was advised return in 1 year, already scheduled for 08/2020 for annual physical.  Order placed for [place] for Mammogram.  Please notify her to call to schedule now that order has been placed.  [facility] [address] [place], [place] [address] Phone: [phone]   [name], [facility] 06/20/2020, 5:53 PM

## 2020-06-20 NOTE — Telephone Encounter (Signed)
Patient requesting preventative mamo orders please send to [place] [facility]. Patient would like a follow up call when completed  [phone]

## 2020-06-21 NOTE — Telephone Encounter (Signed)
Patient informed. 

## 2020-06-23 ENCOUNTER — Ambulatory Visit
Admission: RE | Admit: 2020-06-23 | Discharge: 2020-06-23 | Disposition: A | Discharge status: Home / Self Care | Payer: Medicare Other | Source: Ambulatory Visit | Attending: Family Medicine | Admitting: Family Medicine

## 2020-06-23 ENCOUNTER — Other Ambulatory Visit: Payer: Self-pay

## 2020-06-23 DIAGNOSIS — Z1231 Encounter for screening mammogram for malignant neoplasm of breast: Secondary | ICD-10-CM | POA: Diagnosis not present

## 2020-08-07 ENCOUNTER — Other Ambulatory Visit: Payer: Self-pay | Admitting: Family Medicine

## 2020-08-07 DIAGNOSIS — E782 Mixed hyperlipidemia: Secondary | ICD-10-CM

## 2020-08-09 DIAGNOSIS — R7309 Other abnormal glucose: Secondary | ICD-10-CM

## 2020-08-09 DIAGNOSIS — I129 Hypertensive chronic kidney disease with stage 1 through stage 4 chronic kidney disease, or unspecified chronic kidney disease: Secondary | ICD-10-CM

## 2020-08-09 DIAGNOSIS — Z0000 Encounter for general adult medical examination without abnormal findings: Secondary | ICD-10-CM

## 2020-08-09 DIAGNOSIS — E559 Vitamin D deficiency, unspecified: Secondary | ICD-10-CM

## 2020-08-09 DIAGNOSIS — E782 Mixed hyperlipidemia: Secondary | ICD-10-CM

## 2020-08-09 DIAGNOSIS — E034 Atrophy of thyroid (acquired): Secondary | ICD-10-CM

## 2020-08-09 NOTE — Telephone Encounter (Signed)
Courtesy refill #30 given. Must keep 08/11/21 appointment for additional refills.

## 2020-08-10 DIAGNOSIS — R7309 Other abnormal glucose: Secondary | ICD-10-CM | POA: Diagnosis not present

## 2020-08-10 DIAGNOSIS — N182 Chronic kidney disease, stage 2 (mild): Secondary | ICD-10-CM | POA: Diagnosis not present

## 2020-08-10 DIAGNOSIS — Z0000 Encounter for general adult medical examination without abnormal findings: Secondary | ICD-10-CM | POA: Diagnosis not present

## 2020-08-10 DIAGNOSIS — E034 Atrophy of thyroid (acquired): Secondary | ICD-10-CM | POA: Diagnosis not present

## 2020-08-10 DIAGNOSIS — E559 Vitamin D deficiency, unspecified: Secondary | ICD-10-CM | POA: Diagnosis not present

## 2020-08-10 DIAGNOSIS — I129 Hypertensive chronic kidney disease with stage 1 through stage 4 chronic kidney disease, or unspecified chronic kidney disease: Secondary | ICD-10-CM | POA: Diagnosis not present

## 2020-08-11 ENCOUNTER — Ambulatory Visit (INDEPENDENT_AMBULATORY_CARE_PROVIDER_SITE_OTHER): Payer: Medicare Other | Admitting: Family Medicine

## 2020-08-11 ENCOUNTER — Encounter: Payer: Self-pay | Admitting: Family Medicine

## 2020-08-11 ENCOUNTER — Other Ambulatory Visit: Payer: Self-pay

## 2020-08-11 VITALS — BP 116/70 | HR 73 | Temp 97.3°F | Ht 65.0 in | Wt 188.6 lb

## 2020-08-11 DIAGNOSIS — N182 Chronic kidney disease, stage 2 (mild): Secondary | ICD-10-CM

## 2020-08-11 DIAGNOSIS — Z0000 Encounter for general adult medical examination without abnormal findings: Secondary | ICD-10-CM

## 2020-08-11 DIAGNOSIS — E559 Vitamin D deficiency, unspecified: Secondary | ICD-10-CM

## 2020-08-11 DIAGNOSIS — E782 Mixed hyperlipidemia: Secondary | ICD-10-CM

## 2020-08-11 DIAGNOSIS — E034 Atrophy of thyroid (acquired): Secondary | ICD-10-CM | POA: Diagnosis not present

## 2020-08-11 DIAGNOSIS — E669 Obesity, unspecified: Secondary | ICD-10-CM

## 2020-08-11 DIAGNOSIS — E66811 Obesity, class 1: Secondary | ICD-10-CM | POA: Insufficient documentation

## 2020-08-11 DIAGNOSIS — I129 Hypertensive chronic kidney disease with stage 1 through stage 4 chronic kidney disease, or unspecified chronic kidney disease: Secondary | ICD-10-CM

## 2020-08-11 DIAGNOSIS — Z23 Encounter for immunization: Secondary | ICD-10-CM

## 2020-08-11 DIAGNOSIS — R7309 Other abnormal glucose: Secondary | ICD-10-CM

## 2020-08-11 LAB — COMPREHENSIVE METABOLIC PANEL
ALT: 17 IU/L (ref 0–32)
AST: 18 IU/L (ref 0–40)
Albumin/Globulin Ratio: 2.5 — ABNORMAL HIGH (ref 1.2–2.2)
Albumin: 4.7 g/dL (ref 3.8–4.8)
Alkaline Phosphatase: 92 IU/L (ref 48–121)
BUN/Creatinine Ratio: 18 (ref 12–28)
BUN: 17 mg/dL (ref 8–27)
Bilirubin Total: 0.6 mg/dL (ref 0.0–1.2)
CO2: 24 mmol/L (ref 20–29)
Calcium: 9.8 mg/dL (ref 8.7–10.3)
Chloride: 99 mmol/L (ref 96–106)
Creatinine, Ser: 0.97 mg/dL (ref 0.57–1.00)
GFR calc Af Amer: 69 mL/min/{1.73_m2} (ref >59)
GFR calc non Af Amer: 60 mL/min/{1.73_m2} (ref >59)
Globulin, Total: 1.9 g/dL (ref 1.5–4.5)
Glucose: 94 mg/dL (ref 65–99)
Potassium: 3.9 mmol/L (ref 3.5–5.2)
Sodium: 140 mmol/L (ref 134–144)
Total Protein: 6.6 g/dL (ref 6.0–8.5)

## 2020-08-11 LAB — CBC WITH DIFFERENTIAL/PLATELET
Basophils Absolute: 0.1 10*3/uL (ref 0.0–0.2)
Basos: 1 %
EOS (ABSOLUTE): 0.3 10*3/uL (ref 0.0–0.4)
Eos: 4 %
Hematocrit: 39.2 % (ref 34.0–46.6)
Hemoglobin: 13.2 g/dL (ref 11.1–15.9)
Immature Grans (Abs): 0 10*3/uL (ref 0.0–0.1)
Immature Granulocytes: 0 %
Lymphocytes Absolute: 2.9 10*3/uL (ref 0.7–3.1)
Lymphs: 39 %
MCH: 29.8 pg (ref 26.6–33.0)
MCHC: 33.7 g/dL (ref 31.5–35.7)
MCV: 89 fL (ref 79–97)
Monocytes Absolute: 0.6 10*3/uL (ref 0.1–0.9)
Monocytes: 8 %
Neutrophils Absolute: 3.7 10*3/uL (ref 1.4–7.0)
Neutrophils: 48 %
Platelets: 210 10*3/uL (ref 150–450)
RBC: 4.43 x10E6/uL (ref 3.77–5.28)
RDW: 13.1 % (ref 11.7–15.4)
WBC: 7.6 10*3/uL (ref 3.4–10.8)

## 2020-08-11 LAB — LIPID PANEL
Chol/HDL Ratio: 3.3 ratio (ref 0.0–4.4)
Cholesterol, Total: 235 mg/dL — ABNORMAL HIGH (ref 100–199)
HDL: 71 mg/dL (ref >39)
LDL Chol Calc (NIH): 146 mg/dL — ABNORMAL HIGH (ref 0–99)
Triglycerides: 105 mg/dL (ref 0–149)
VLDL Cholesterol Cal: 18 mg/dL (ref 5–40)

## 2020-08-11 LAB — T4, FREE: Free T4: 1.53 ng/dL (ref 0.82–1.77)

## 2020-08-11 LAB — HEMOGLOBIN A1C
Est. average glucose Bld gHb Est-mCnc: 120 mg/dL
Hgb A1c MFr Bld: 5.8 % — ABNORMAL HIGH (ref 4.8–5.6)

## 2020-08-11 LAB — TSH: TSH: 1.83 u[IU]/mL (ref 0.450–4.500)

## 2020-08-11 LAB — VITAMIN D 25 HYDROXY (VIT D DEFICIENCY, FRACTURES): Vit D, 25-Hydroxy: 29.1 ng/mL — ABNORMAL LOW (ref 30.0–100.0)

## 2020-08-11 MED ORDER — ROSUVASTATIN CALCIUM 5 MG PO TABS: ORAL_TABLET | ORAL | 3 refills | Status: DC

## 2020-08-11 NOTE — Progress Notes (Signed)
Subjective:    Patient ID: Jillian Fritz, female    DOB: [birth year], 69 y.o.   MRN: [id number]  Jillian Fritz is a 69 y.o. female presenting on 08/11/2020 for Annual Exam   HPI   Here for Annual Physical and Lab Review.  CHRONIC HTNwith CKD-III Last lab showed stable Creatinine 0.97 - Today patient reportsstilloverall doing well withhome BP readings controlled Current Meds - Losartan 50mg  daily, HCTZ 12.5mg  daily Tolerating well, w/o complaints. - Still improved edema Not taking NSAIDs  GERD Previously controlled. had esophageal dilatation in past, off PPI worsening, request med refill  History of Heart Murmur Not heard today, she takes preventative antibiotic from dentist before cleaning, no heart procedure or valve issue, can stop or do ECHO  Elevated A1c Previous elevated A1c 5.5 to 5.6 Today result shows 5.8, elevated with admitted diet on higher carb Meds:not on meds Currently on ARB Limited regular exercise, plans to improve this in future when retire.  HYPERLIPIDEMIA: prior elevated LDL and total cholesterol, previously improved on meds - taking Zetia 10 and Rosuva 5mg  intermittent, then ran out of rosuvastatin did not get re order. - last lab showed elevated LDL still  Hypothyroidism: Last labs 08/2020, TSH and Free T4 normal Continues on Levothyroxine 70mcg daily - No significant symptoms  Osteopenia, spine / Vitamin D Deficiency Vitamin D reduced to 29 on last lab, still taking Vit D supplement unsure dose   Health Maintenance:  Due for Flu Shot, will receive today   UTD COVID19 vaccine Pfizer March 2021  Colon CA Screening: Last Colonoscopy done 04/15/2020 Dr Allen Norris [facility] [place], one larger polyp >1 cm tubular adenoma, decided return in 3 years based on size and pathology  Breast CA Screening - 06/23/20 - negative birads 1, [place], see below for result, considering every other year.  Depression screen [facility] 2/9 08/11/2020 08/07/2019  01/30/2019  Decreased Interest 0 0 0  Down, Depressed, Hopeless 0 0 0  PHQ - 2 Score 0 0 0    Past Medical History:  Diagnosis Date  . Arthritis    knuckles  . Asthma    childhood - exercise induced  . Dental bridge present    flexible, removable, upper, fits snuggly  . GERD (gastroesophageal reflux disease)   . Heart murmur    states she has 2 murmurs/no issues except when pregnant  . Hypertension    controlled on meds  . Hypothyroidism   . Migraines    2 or 3 years ago  . Motion sickness    cars, planes  . Obesity   . Shortness of breath dyspnea   . Wears contact lenses    Past Surgical History:  Procedure Laterality Date  . CERVICAL CONE BIOPSY    . COLONOSCOPY WITH PROPOFOL N/A 04/14/2020   Procedure: COLONOSCOPY WITH BIOPSY;  Surgeon: [name], MD;  Location: [place];  Service: Endoscopy;  Laterality: N/A;  priority 3  . ESOPHAGOGASTRODUODENOSCOPY (EGD) WITH PROPOFOL N/A 03/23/2016   Procedure: ESOPHAGOGASTRODUODENOSCOPY (EGD) WITH esophageal dilation. ;  Surgeon: [name], MD;  Location: [place];  Service: Endoscopy;  Laterality: N/A;  . POLYPECTOMY N/A 04/14/2020   Procedure: POLYPECTOMY;  Surgeon: [name], MD;  Location: [place];  Service: Endoscopy;  Laterality: N/A;  2 Clips placed at [facility] Colon Polyp site  . THROAT SURGERY  04/2014   Social History   Socioeconomic History  . Marital status: Single    Spouse name: Not on file  .  Number of children: Not on file  . Years of education: Not on file  . Highest education level: Not on file  Occupational History  . Occupation: [employer]  Tobacco Use  . Smoking status: Never Smoker  . Smokeless tobacco: Never Used  Vaping Use  . Vaping Use: Never used  Substance and Sexual Activity  . Alcohol use: No    Alcohol/week: 0.0 standard drinks  . Drug use: No  . Sexual activity: Not on file  Other Topics Concern  . Not on file  Social History  Narrative  . Not on file   Social Determinants of Health   Financial Resource Strain:   . Difficulty of Paying Living Expenses: Not on file  Food Insecurity:   . Worried About Charity fundraiser in the Last Year: Not on file  . Ran Out of Food in the Last Year: Not on file  Transportation Needs:   . Lack of Transportation (Medical): Not on file  . Lack of Transportation (Non-Medical): Not on file  Physical Activity:   . Days of Exercise per Week: Not on file  . Minutes of Exercise per Session: Not on file  Stress:   . Feeling of Stress : Not on file  Social Connections:   . Frequency of Communication with Friends and Family: Not on file  . Frequency of Social Gatherings with Friends and Family: Not on file  . Attends Religious Services: Not on file  . Active Member of Clubs or Organizations: Not on file  . Attends Archivist Meetings: Not on file  . Marital Status: Not on file  Intimate Partner Violence:   . Fear of Current or Ex-Partner: Not on file  . Emotionally Abused: Not on file  . Physically Abused: Not on file  . Sexually Abused: Not on file   Family History  Problem Relation Age of Onset  . Alzheimer's disease Father   . Hypertension Mother   . Breast cancer Paternal Aunt        3 pat aunts  . Thyroid disease Sister    Current Outpatient Medications on File Prior to Visit  Medication Sig  . albuterol (PROVENTIL HFA;VENTOLIN HFA) 108 (90 Base) MCG/ACT inhaler Inhale 2 puffs into the lungs every 4 (four) hours as needed for wheezing or shortness of breath (cough).  Marland Kitchen aspirin EC 81 MG tablet Take 1 tablet (81 mg total) by mouth daily.  . Cholecalciferol (VITAMIN D3) 50 MCG (2000 UT) TABS Take by mouth.  . Coenzyme Q10 (COQ10) 100 MG CAPS Take by mouth.  . ezetimibe (ZETIA) 10 MG tablet TAKE 1 TABLET BY MOUTH  DAILY  . hydrochlorothiazide (HYDRODIURIL) 12.5 MG tablet Take 1 tablet (12.5 mg total) by mouth daily.  Marland Kitchen levothyroxine (SYNTHROID) 88 MCG tablet  Take 1 tablet (88 mcg total) by mouth daily before breakfast.  . losartan (COZAAR) 50 MG tablet Take 1 tablet (50 mg total) by mouth daily.  . pantoprazole (PROTONIX) 40 MG tablet Take 1 tablet (40 mg total) by mouth daily before breakfast.  . Probiotic Product (PROBIOTIC DAILY PO) Take by mouth daily.   No current facility-administered medications on file prior to visit.    Review of Systems  Constitutional: Negative for activity change, appetite change, chills, diaphoresis, fatigue and fever.  HENT: Negative for congestion and hearing loss.   Eyes: Negative for visual disturbance.  Respiratory: Negative for apnea, cough, chest tightness, shortness of breath and wheezing.   Cardiovascular: Negative for chest pain,  palpitations and leg swelling.  Gastrointestinal: Negative for abdominal pain, anal bleeding, blood in stool, constipation, diarrhea, nausea and vomiting.  Endocrine: Negative for cold intolerance.  Genitourinary: Negative for difficulty urinating, dysuria, frequency and hematuria.  Musculoskeletal: Negative for arthralgias, back pain and neck pain.  Skin: Negative for rash.  Allergic/Immunologic: Negative for environmental allergies.  Neurological: Negative for dizziness, weakness, light-headedness, numbness and headaches.  Hematological: Negative for adenopathy.  Psychiatric/Behavioral: Negative for behavioral problems, dysphoric mood and sleep disturbance. The patient is not nervous/anxious.    Per HPI unless specifically indicated above      Objective:    BP 116/70   Pulse 73   Temp (!) 97.3 F (36.3 C) (Temporal)   Ht 5\' 5"  (1.651 m)   Wt 188 lb 9.6 oz (85.5 kg)   SpO2 98%   BMI 31.38 kg/m   Wt Readings from Last 3 Encounters:  08/11/20 188 lb 9.6 oz (85.5 kg)  04/14/20 178 lb (80.7 kg)  08/07/19 184 lb (83.5 kg)    Physical Exam Vitals and nursing note reviewed.  Constitutional:      General: She is not in acute distress.    Appearance: She is  well-developed. She is not diaphoretic.     Comments: Well-appearing, comfortable, cooperative  HENT:     Head: Normocephalic and atraumatic.  Eyes:     General:        Right eye: No discharge.        Left eye: No discharge.     Conjunctiva/sclera: Conjunctivae normal.     Pupils: Pupils are equal, round, and reactive to light.  Neck:     Thyroid: No thyromegaly.     Vascular: No carotid bruit.  Cardiovascular:     Rate and Rhythm: Normal rate and regular rhythm.     Heart sounds: Normal heart sounds. No murmur heard.   Pulmonary:     Effort: Pulmonary effort is normal. No respiratory distress.     Breath sounds: Normal breath sounds. No wheezing or rales.  Abdominal:     General: Bowel sounds are normal. There is no distension.     Palpations: Abdomen is soft. There is no mass.     Tenderness: There is no abdominal tenderness.  Musculoskeletal:        General: No tenderness. Normal range of motion.     Cervical back: Normal range of motion and neck supple.     Comments: Upper / Lower Extremities: - Normal muscle tone, strength bilateral upper extremities 5/5, lower extremities 5/5  Lymphadenopathy:     Cervical: No cervical adenopathy.  Skin:    General: Skin is warm and dry.     Findings: No erythema or rash.  Neurological:     Mental Status: She is alert and oriented to person, place, and time.     Comments: Distal sensation intact to light touch all extremities  Psychiatric:        Behavior: Behavior normal.     Comments: Well groomed, good eye contact, normal speech and thoughts      CLINICAL DATA:  Screening.  EXAM: DIGITAL SCREENING BILATERAL MAMMOGRAM WITH TOMO AND CAD  COMPARISON:  Previous exam(s).  ACR Breast Density Category b: There are scattered areas of fibroglandular density.  FINDINGS: There are no findings suspicious for malignancy. Images were processed with CAD.  IMPRESSION: No mammographic evidence of malignancy. A result letter of  this screening mammogram will be mailed directly to the patient.  RECOMMENDATION: Screening mammogram in  one year. (Code:[id number]-B-01Y)  BI-RADS CATEGORY  1: Negative.   Electronically Signed   By: Evangeline Dakin M.D.   On: 06/24/2020 15:27  ------  SURGICAL PATHOLOGY  CASE: [id number]  PATIENT: Loraine Grip  Surgical Pathology Report      Specimen Submitted:  A. Colon polyp, ascending; hot snare   Clinical History: Positive cologuard. Colon polyp       DIAGNOSIS:  A. COLON POLYP, ASCENDING; HOT SNARE:  - TUBULAR ADENOMA, 1.1 CM.  - NEGATIVE FOR DYSPLASIA AND MALIGNANCY.   Comment:  Due to tissue fragmentation evaluation of the inked cauterized base is  not possible.   GROSS DESCRIPTION:  A. Labeled: Ascending colon polyp x1 hot snare  Received: Formalin  Tissue fragment(s): 1  Size: 1.1 x 0.9 x 0.6 cm  Description: Received is a polypoid fragment of tan soft tissue. The  surgical resection margin is inked blue. Perpendicularly trisected in  relation to the resection margin.  Entirely submitted in 1 cassette.     Final Diagnosis performed by Quay Burow, MD.  Electronically signed  04/15/2020 3:46:00PM  The electronic signature indicates that the named Attending Pathologist  has evaluated the specimen  Technical component performed at [facility], [address], [place],  [place] [address] Lab: [phone] Dir: Rush Farmer, MD, MMM  Professional component performed at [facility], [facility], [place], [place], [place] [address] Lab: [phone]  Dir: Dellia Nims. Rubinas, MD   Results for orders placed or performed in visit on 08/09/20  VITAMIN D 25 Hydroxy (Vit-D Deficiency, Fractures)  Result Value Ref Range   Vit D, 25-Hydroxy 29.1 (L) 30.0 - 100.0 ng/mL  T4, free  Result Value Ref Range   Free T4 1.53 0.82 - 1.77 ng/dL  TSH  Result Value Ref Range   TSH 1.830 0.450 - 4.500 uIU/mL  Comprehensive metabolic  panel  Result Value Ref Range   Glucose 94 65 - 99 mg/dL   BUN 17 8 - 27 mg/dL   Creatinine, Ser 0.97 0.57 - 1.00 mg/dL   GFR calc non Af Amer 60 >59 mL/min/1.73   GFR calc Af Amer 69 >59 mL/min/1.73   BUN/Creatinine Ratio 18 12 - 28   Sodium 140 134 - 144 mmol/L   Potassium 3.9 3.5 - 5.2 mmol/L   Chloride 99 96 - 106 mmol/L   CO2 24 20 - 29 mmol/L   Calcium 9.8 8.7 - 10.3 mg/dL   Total Protein 6.6 6.0 - 8.5 g/dL   Albumin 4.7 3.8 - 4.8 g/dL   Globulin, Total 1.9 1.5 - 4.5 g/dL   Albumin/Globulin Ratio 2.5 (H) 1.2 - 2.2   Bilirubin Total 0.6 0.0 - 1.2 mg/dL   Alkaline Phosphatase 92 48 - 121 IU/L   AST 18 0 - 40 IU/L   ALT 17 0 - 32 IU/L  Lipid panel  Result Value Ref Range   Cholesterol, Total 235 (H) 100 - 199 mg/dL   Triglycerides 105 0 - 149 mg/dL   HDL 71 >39 mg/dL   VLDL Cholesterol Cal 18 5 - 40 mg/dL   LDL Chol Calc (NIH) 146 (H) 0 - 99 mg/dL   Chol/HDL Ratio 3.3 0.0 - 4.4 ratio  CBC with Differential/Platelet  Result Value Ref Range   WBC 7.6 3.4 - 10.8 x10E3/uL   RBC 4.43 3.77 - 5.28 x10E6/uL   Hemoglobin 13.2 11.1 - 15.9 g/dL   Hematocrit 39.2 34.0 - 46.6 %   MCV 89 79 - 97 fL  MCH 29.8 26.6 - 33.0 pg   MCHC 33.7 31 - 35 g/dL   RDW 13.1 11.7 - 15.4 %   Platelets 210 150 - 450 x10E3/uL   Neutrophils 48 Not Estab. %   Lymphs 39 Not Estab. %   Monocytes 8 Not Estab. %   Eos 4 Not Estab. %   Basos 1 Not Estab. %   Neutrophils Absolute 3.7 1 - 7 x10E3/uL   Lymphocytes Absolute 2.9 0 - 3 x10E3/uL   Monocytes Absolute 0.6 0 - 0 x10E3/uL   EOS (ABSOLUTE) 0.3 0.0 - 0.4 x10E3/uL   Basophils Absolute 0.1 0 - 0 x10E3/uL   Immature Granulocytes 0 Not Estab. %   Immature Grans (Abs) 0.0 0.0 - 0.1 x10E3/uL  Hemoglobin A1c  Result Value Ref Range   Hgb A1c MFr Bld 5.8 (H) 4.8 - 5.6 %   Est. average glucose Bld gHb Est-mCnc 120 mg/dL      Assessment & Plan:   Problem List Items Addressed This Visit    Vitamin D deficiency    Mild low Vit D Increase to Vitamin  D3 2,000 daily      Obesity (BMI 30.0-34.9)    BMI >31 Encourage diet exercise lifestyle      Mixed hyperlipidemia    elevated LDL OFF statin Last lipid panel 08/2020 Calculated ASCVD 10 yr risk score elevated >7.5%  Plan: 1. RE ORDER intermittent statin Rosuvastatin 5mg  to 3  X week + Zetia 2. Encourage improved lifestyle - low carb/cholesterol, reduce portion size, continue improving regular exercise      Relevant Medications   rosuvastatin (CRESTOR) 5 MG tablet   Hypothyroidism    Controlled hypothyroidism Last lab normal Continue Levothyroxine 40mcg daily      Elevated hemoglobin A1c    Mild elevated A1c to 5.8 Concern with obesity, HTN, HLD  Plan:  1. Not on any therapy currently  2. Encourage improved lifestyle - low carb, low sugar diet, reduce portion size, continue improving regular exercise      CKD (chronic kidney disease), stage II   Benign hypertension with CKD (chronic kidney disease), stage II    Well-controlled HTN - Home BP readings improved control  Complication CKD-II    Plan:  1. Continue current BP regimen - Losartan 50mg  daily and HCTZ 12.5mg  daily - refill 2. Encourage improved lifestyle - low sodium diet, regular exercise 3. Continue monitor BP outside office, bring readings to next visit, if persistently >140/90 or new symptoms notify office sooner      Relevant Medications   rosuvastatin (CRESTOR) 5 MG tablet    Other Visit Diagnoses    Annual physical exam    -  Primary   Needs flu shot       Relevant Orders   Flu Vaccine QUAD High Dose(Fluad) (Completed)      Updated Health Maintenance information Reviewed recent lab results with patient Encouraged improvement to lifestyle with diet and exercise - Goal of weight loss   Meds ordered this encounter  Medications  . rosuvastatin (CRESTOR) 5 MG tablet    Sig: TAKE 1 TABLET THREE TIMES WEEKLY (90 DAY DOSING FOR 3 TIMES A WEEK )    Dispense:  36 tablet    Refill:  3       Follow up plan: Return in about 1 year (around 08/11/2021) for Follow-up 1 year fasting lab only then 1 week later Annual Physical.  [place], DO [facility] 08/11/2020, 9:48 AM

## 2020-08-11 NOTE — Assessment & Plan Note (Signed)
BMI >31 Encourage diet exercise lifestyle

## 2020-08-11 NOTE — Patient Instructions (Addendum)
Thank you for coming to the office today.  Vitamin D3 mildly low at 29, should be >30, you may take up to max of 2,000 unit VItamin D3 daily.  Flu shot today  Recent Labs    08/10/20 1028  HGBA1C 5.8*   Reduce carbs in diet, otherwise keep up the good work!  Restart Rosuvastatin 5mg  - 3 times a week, new order sent to [facility].  Next colonoscopy from Dr Allen Norris - 3 years, approximately 04/2023  Mammogram every other year is fine.  DUE for FASTING BLOOD WORK (no food or drink after midnight before the lab appointment, only water or coffee without cream/sugar on the morning of)  SCHEDULE "Lab Only" visit in the morning at the clinic for lab draw in 1 YEAR  - Make sure Lab Only appointment is at about 1 week before your next appointment, so that results will be available  For Lab Results, once available within 2-3 days of blood draw, you can can log in to MyChart online to view your results and a brief explanation. Also, we can discuss results at next follow-up visit.    Please schedule a Follow-up Appointment to: Return in about 1 year (around 08/11/2021) for Follow-up 1 year fasting lab only then 1 week later Annual Physical.  If you have any other questions or concerns, please feel free to call the office or send a message through [place]. You may also schedule an earlier appointment if necessary.  Additionally, you may be receiving a survey about your experience at our office within a few days to 1 week by e-mail or mail. We value your feedback.  [name], DO [place]

## 2020-08-11 NOTE — Assessment & Plan Note (Signed)
Well-controlled HTN - Home BP readings improved control  Complication CKD-II    Plan:  1. Continue current BP regimen - Losartan 50mg daily and HCTZ 12.5mg daily - refill 2. Encourage improved lifestyle - low sodium diet, regular exercise 3. Continue monitor BP outside office, bring readings to next visit, if persistently >140/90 or new symptoms notify office sooner 

## 2020-08-11 NOTE — Assessment & Plan Note (Signed)
Controlled hypothyroidism Last lab normal Continue Levothyroxine 88mcg daily 

## 2020-08-11 NOTE — Assessment & Plan Note (Signed)
elevated LDL OFF statin Last lipid panel 08/2020 Calculated ASCVD 10 yr risk score elevated >7.5%  Plan: 1. RE ORDER intermittent statin Rosuvastatin 5mg  to 3  X week + Zetia 2. Encourage improved lifestyle - low carb/cholesterol, reduce portion size, continue improving regular exercise

## 2020-08-11 NOTE — Assessment & Plan Note (Signed)
Mild elevated A1c to 5.8 Concern with obesity, HTN, HLD  Plan:  1. Not on any therapy currently  2. Encourage improved lifestyle - low carb, low sugar diet, reduce portion size, continue improving regular exercise

## 2020-08-11 NOTE — Assessment & Plan Note (Signed)
Mild low Vit D Increase to Vitamin D3 2,000 daily

## 2020-09-09 ENCOUNTER — Other Ambulatory Visit: Payer: Self-pay | Admitting: Family Medicine

## 2020-09-09 DIAGNOSIS — I129 Hypertensive chronic kidney disease with stage 1 through stage 4 chronic kidney disease, or unspecified chronic kidney disease: Secondary | ICD-10-CM

## 2020-09-24 ENCOUNTER — Other Ambulatory Visit: Payer: Self-pay | Admitting: Family Medicine

## 2020-09-24 DIAGNOSIS — I129 Hypertensive chronic kidney disease with stage 1 through stage 4 chronic kidney disease, or unspecified chronic kidney disease: Secondary | ICD-10-CM

## 2020-09-25 NOTE — Telephone Encounter (Signed)
Requested Prescriptions  Pending Prescriptions Disp Refills  . hydrochlorothiazide (HYDRODIURIL) 12.5 MG tablet [Pharmacy Med Name: hydroCHLOROthiazide 12.5 MG Oral Tablet] 90 tablet 3    Sig: TAKE 1 TABLET BY MOUTH  DAILY     Cardiovascular: Diuretics - Thiazide Passed - 09/24/2020 11:07 PM      Passed - Ca in normal range and within 360 days    Calcium  Date Value Ref Range Status  08/10/2020 9.8 8.7 - 10.3 mg/dL Final   Calcium, Total  Date Value Ref Range Status  10/29/2013 9.1 8.5 - 10.1 mg/dL Final         Passed - Cr in normal range and within 360 days    Creat  Date Value Ref Range Status  08/16/2016 1.13 (H) 0.50 - 0.99 mg/dL Final    Comment:      For patients > or = 68 years of age: The upper reference limit for Creatinine is approximately 13% higher for people identified as African-American.      Creatinine, Ser  Date Value Ref Range Status  08/10/2020 0.97 0.57 - 1.00 mg/dL Final         Passed - K in normal range and within 360 days    Potassium  Date Value Ref Range Status  08/10/2020 3.9 3.5 - 5.2 mmol/L Final  10/29/2013 3.6 3.5 - 5.1 mmol/L Final         Passed - Na in normal range and within 360 days    Sodium  Date Value Ref Range Status  08/10/2020 140 134 - 144 mmol/L Final  10/29/2013 140 136 - 145 mmol/L Final         Passed - Last BP in normal range    BP Readings from Last 1 Encounters:  08/11/20 116/70         Passed - Valid encounter within last 6 months    Recent Outpatient Visits          1 month ago Annual physical exam   [facility] Olin Hauser, DO   1 year ago Annual physical exam   [facility] Olin Hauser, DO   1 year ago Hypothyroidism due to acquired atrophy of thyroid   [place], Devonne Doughty, DO   2 years ago Annual physical exam   [facility] Olin Hauser, DO   2 years ago Essential hypertension    [place], DO      Future Appointments            In 10 months [place], Devonne Doughty, [facility], [facility]

## 2020-11-04 ENCOUNTER — Other Ambulatory Visit: Payer: Medicare Other

## 2020-11-04 DIAGNOSIS — Z20822 Contact with and (suspected) exposure to covid-19: Secondary | ICD-10-CM | POA: Diagnosis not present

## 2020-11-05 LAB — SARS-COV-2, NAA 2 DAY TAT

## 2020-11-05 LAB — NOVEL CORONAVIRUS, NAA: SARS-CoV-2, NAA: NOT DETECTED

## 2021-02-02 ENCOUNTER — Other Ambulatory Visit: Payer: Self-pay | Admitting: Family Medicine

## 2021-02-02 DIAGNOSIS — K219 Gastro-esophageal reflux disease without esophagitis: Secondary | ICD-10-CM

## 2021-03-24 ENCOUNTER — Other Ambulatory Visit: Payer: Self-pay | Admitting: Family Medicine

## 2021-03-24 DIAGNOSIS — E782 Mixed hyperlipidemia: Secondary | ICD-10-CM

## 2021-03-31 ENCOUNTER — Other Ambulatory Visit: Payer: Self-pay

## 2021-03-31 DIAGNOSIS — E782 Mixed hyperlipidemia: Secondary | ICD-10-CM

## 2021-03-31 MED ORDER — EZETIMIBE 10 MG PO TABS: 10.0000 mg | ORAL_TABLET | Freq: Every day | ORAL | 0 refills | Status: DC

## 2021-05-04 ENCOUNTER — Other Ambulatory Visit: Payer: Self-pay

## 2021-05-04 DIAGNOSIS — E782 Mixed hyperlipidemia: Secondary | ICD-10-CM

## 2021-05-04 MED ORDER — EZETIMIBE 10 MG PO TABS: 10.0000 mg | ORAL_TABLET | Freq: Every day | ORAL | 0 refills | Status: DC

## 2021-05-12 ENCOUNTER — Other Ambulatory Visit: Payer: Self-pay | Admitting: Family Medicine

## 2021-05-12 DIAGNOSIS — E034 Atrophy of thyroid (acquired): Secondary | ICD-10-CM

## 2021-05-18 ENCOUNTER — Other Ambulatory Visit: Payer: Self-pay | Admitting: Family Medicine

## 2021-05-18 DIAGNOSIS — E782 Mixed hyperlipidemia: Secondary | ICD-10-CM

## 2021-06-08 ENCOUNTER — Other Ambulatory Visit: Payer: Self-pay | Admitting: Family Medicine

## 2021-06-08 DIAGNOSIS — I129 Hypertensive chronic kidney disease with stage 1 through stage 4 chronic kidney disease, or unspecified chronic kidney disease: Secondary | ICD-10-CM

## 2021-06-08 DIAGNOSIS — N182 Chronic kidney disease, stage 2 (mild): Secondary | ICD-10-CM

## 2021-06-08 NOTE — Telephone Encounter (Signed)
   Notes to clinic:  Patient has appt on 09/08/2021 Review for enough medication until appt   Requested Prescriptions  Pending Prescriptions Disp Refills   losartan (COZAAR) 50 MG tablet [Pharmacy Med Name: LOSARTAN POTASSIUM 50 MG TAB] 90 tablet 1    Sig: TAKE 1 TABLET BY MOUTH EVERY DAY      Cardiovascular:  Angiotensin Receptor Blockers Failed - 06/08/2021  4:50 AM      Failed - Cr in normal range and within 180 days    Creat  Date Value Ref Range Status  08/16/2016 1.13 (H) 0.50 - 0.99 mg/dL Final    Comment:      For patients > or = 69 years of age: The upper reference limit for Creatinine is approximately 13% higher for people identified as African-American.      Creatinine, Ser  Date Value Ref Range Status  08/10/2020 0.97 0.57 - 1.00 mg/dL Final          Failed - K in normal range and within 180 days    Potassium  Date Value Ref Range Status  08/10/2020 3.9 3.5 - 5.2 mmol/L Final  10/29/2013 3.6 3.5 - 5.1 mmol/L Final          Failed - Valid encounter within last 6 months    Recent Outpatient Visits           10 months ago Annual physical exam   [place], DO   1 year ago Annual physical exam   [facility] [place], Devonne Doughty, DO   2 years ago Hypothyroidism due to acquired atrophy of thyroid   [place], Devonne Doughty, DO   2 years ago Annual physical exam   [facility] Olin Hauser, DO   3 years ago Essential hypertension   [place], DO       Future Appointments             In 3 months [facility], Devonne Doughty, [facility], [place] - Patient is not pregnant      Passed - Last BP in normal range    BP Readings from Last 1 Encounters:  08/11/20 116/70

## 2021-06-25 ENCOUNTER — Encounter: Payer: Self-pay | Admitting: Family Medicine

## 2021-06-26 ENCOUNTER — Other Ambulatory Visit: Payer: Self-pay

## 2021-06-26 DIAGNOSIS — J4521 Mild intermittent asthma with (acute) exacerbation: Secondary | ICD-10-CM

## 2021-06-26 MED ORDER — ALBUTEROL SULFATE HFA 108 (90 BASE) MCG/ACT IN AERS: 2.0000 | INHALATION_SPRAY | RESPIRATORY_TRACT | 1 refills | Status: DC | PRN

## 2021-07-26 ENCOUNTER — Other Ambulatory Visit: Payer: Self-pay | Admitting: Family Medicine

## 2021-07-26 DIAGNOSIS — E782 Mixed hyperlipidemia: Secondary | ICD-10-CM

## 2021-07-26 DIAGNOSIS — K219 Gastro-esophageal reflux disease without esophagitis: Secondary | ICD-10-CM

## 2021-07-26 NOTE — Telephone Encounter (Signed)
Requested Prescriptions  Pending Prescriptions Disp Refills  . pantoprazole (PROTONIX) 40 MG tablet [Pharmacy Med Name: Pantoprazole Sodium 40 MG Oral Tablet Delayed Release] 90 tablet 0    Sig: TAKE 1 TABLET BY MOUTH  DAILY BEFORE BREAKFAST     Gastroenterology: Proton Pump Inhibitors Passed - 07/26/2021 11:24 PM      Passed - Valid encounter within last 12 months    Recent Outpatient Visits          11 months ago Annual physical exam   [place], Devonne Doughty, DO   1 year ago Annual physical exam   [facility] Olin Hauser, DO   2 years ago Hypothyroidism due to acquired atrophy of thyroid   [place], Devonne Doughty, DO   2 years ago Annual physical exam   [facility] Olin Hauser, DO   3 years ago Essential hypertension   [place], DO      Future Appointments            In 1 week  [facility], [place]   In 1 month [place], Devonne Doughty, DO [facility], [place]           . ezetimibe (ZETIA) 10 MG tablet [Pharmacy Med Name: Ezetimibe 10 MG Oral Tablet] 90 tablet 0    Sig: TAKE 1 TABLET BY MOUTH  DAILY     Cardiovascular:  Antilipid - Sterol Transport Inhibitors Failed - 07/26/2021 11:24 PM      Failed - Total Cholesterol in normal range and within 360 days    Cholesterol, Total  Date Value Ref Range Status  08/10/2020 235 (H) 100 - 199 mg/dL Final         Failed - LDL in normal range and within 360 days    LDL Chol Calc (NIH)  Date Value Ref Range Status  08/10/2020 146 (H) 0 - 99 mg/dL Final         Passed - HDL in normal range and within 360 days    HDL  Date Value Ref Range Status  08/10/2020 71 >39 mg/dL Final         Passed - Triglycerides in normal range and within 360 days    Triglycerides  Date Value Ref Range Status  08/10/2020 105 0 - 149 mg/dL Final         Passed -  Valid encounter within last 12 months    Recent Outpatient Visits          11 months ago Annual physical exam   [facility] Olin Hauser, DO   1 year ago Annual physical exam   [place], Devonne Doughty, DO   2 years ago Hypothyroidism due to acquired atrophy of thyroid   [place], Devonne Doughty, DO   2 years ago Annual physical exam   [facility] Olin Hauser, DO   3 years ago Essential hypertension   [place], Devonne Doughty, DO      Future Appointments            In 1 week  [facility], [place]   In 1 month [place], [facility], [facility]

## 2021-08-06 ENCOUNTER — Other Ambulatory Visit: Payer: Self-pay | Admitting: Family Medicine

## 2021-08-06 DIAGNOSIS — E034 Atrophy of thyroid (acquired): Secondary | ICD-10-CM

## 2021-08-06 NOTE — Telephone Encounter (Signed)
Requested Prescriptions  Pending Prescriptions Disp Refills  . levothyroxine (SYNTHROID) 88 MCG tablet [Pharmacy Med Name: LEVOTHYROXINE 88 MCG TABLET] 30 tablet 0    Sig: TAKE 1 TABLET BY MOUTH DAILY BEFORE BREAKFAST.     Endocrinology:  Hypothyroid Agents Failed - 08/06/2021  9:10 AM      Failed - TSH needs to be rechecked within 3 months after an abnormal result. Refill until TSH is due.      Failed - TSH in normal range and within 360 days    TSH  Date Value Ref Range Status  08/10/2020 1.830 0.450 - 4.500 uIU/mL Final         Passed - Valid encounter within last 12 months    Recent Outpatient Visits          12 months ago Annual physical exam   [place], [place]   2 years ago Annual physical exam   [facility] [place], Devonne Doughty, DO   2 years ago Hypothyroidism due to acquired atrophy of thyroid   [facility] Olin Hauser, DO   3 years ago Annual physical exam   [facility] Olin Hauser, DO   3 years ago Essential hypertension   [place], Devonne Doughty, DO      Future Appointments            In 2 days  [facility], [place]   In 1 month [facility], [facility]

## 2021-08-08 ENCOUNTER — Ambulatory Visit (INDEPENDENT_AMBULATORY_CARE_PROVIDER_SITE_OTHER): Payer: Medicare Other

## 2021-08-08 VITALS — Ht 65.5 in | Wt 185.4 lb

## 2021-08-08 DIAGNOSIS — Z0000 Encounter for general adult medical examination without abnormal findings: Secondary | ICD-10-CM | POA: Diagnosis not present

## 2021-08-08 NOTE — Patient Instructions (Signed)
Jillian Fritz , Thank you for taking time to come for your Medicare Wellness Visit. I appreciate your ongoing commitment to your health goals. Please review the following plan we discussed and let me know if I can assist you in the future.   Screening recommendations/referrals: Colonoscopy: completed 04/15/2020 Mammogram: patient to schedule Bone Density: completed 10/15/2017 Recommended yearly ophthalmology/optometry visit for glaucoma screening and checkup Recommended yearly dental visit for hygiene and checkup  Vaccinations: Influenza vaccine: due Pneumococcal vaccine: completed 07/30/2018 Tdap vaccine: due Shingles vaccine: completed   Covid-19:11/10/2020, 02/24/2020, 02/03/2020  Advanced directives: Please bring a copy of your POA (Power of Attorney) and/or Living Will to your next appointment.   Conditions/risks identified: none  Next appointment: Follow up in one year for your annual wellness visit    Preventive Care 65 Years and Older, Female Preventive care refers to lifestyle choices and visits with your health care provider that can promote health and wellness. What does preventive care include? A yearly physical exam. This is also called an annual well check. Dental exams once or twice a year. Routine eye exams. Ask your health care provider how often you should have your eyes checked. Personal lifestyle choices, including: Daily care of your teeth and gums. Regular physical activity. Eating a healthy diet. Avoiding tobacco and drug use. Limiting alcohol use. Practicing safe sex. Taking low-dose aspirin every day. Taking vitamin and mineral supplements as recommended by your health care provider. What happens during an annual well check? The services and screenings done by your health care provider during your annual well check will depend on your age, overall health, lifestyle risk factors, and family history of disease. Counseling  Your health care provider may ask  you questions about your: Alcohol use. Tobacco use. Drug use. Emotional well-being. Home and relationship well-being. Sexual activity. Eating habits. History of falls. Memory and ability to understand (cognition). Work and work Statistician. Reproductive health. Screening  You may have the following tests or measurements: Height, weight, and BMI. Blood pressure. Lipid and cholesterol levels. These may be checked every 5 years, or more frequently if you are over 71 years old. Skin check. Lung cancer screening. You may have this screening every year starting at age 10 if you have a 30-pack-year history of smoking and currently smoke or have quit within the past 15 years. Fecal occult blood test (FOBT) of the stool. You may have this test every year starting at age 48. Flexible sigmoidoscopy or colonoscopy. You may have a sigmoidoscopy every 5 years or a colonoscopy every 10 years starting at age 36. Hepatitis C blood test. Hepatitis B blood test. Sexually transmitted disease (STD) testing. Diabetes screening. This is done by checking your blood sugar (glucose) after you have not eaten for a while (fasting). You may have this done every 1-3 years. Bone density scan. This is done to screen for osteoporosis. You may have this done starting at age 71. Mammogram. This may be done every 1-2 years. Talk to your health care provider about how often you should have regular mammograms. Talk with your health care provider about your test results, treatment options, and if necessary, the need for more tests. Vaccines  Your health care provider may recommend certain vaccines, such as: Influenza vaccine. This is recommended every year. Tetanus, diphtheria, and acellular pertussis (Tdap, Td) vaccine. You may need a Td booster every 10 years. Zoster vaccine. You may need this after age 65. Pneumococcal 13-valent conjugate (PCV13) vaccine. One dose is recommended after age  65. Pneumococcal  polysaccharide (PPSV23) vaccine. One dose is recommended after age 79. Talk to your health care provider about which screenings and vaccines you need and how often you need them. This information is not intended to replace advice given to you by your health care provider. Make sure you discuss any questions you have with your health care provider. Document Released: 12/16/2015 Document Revised: 08/08/2016 Document Reviewed: 09/20/2015 Elsevier Interactive Patient Education  2017 [place] Prevention in the Home Falls can cause injuries. They can happen to people of all ages. There are many things you can do to make your home safe and to help prevent falls. What can I do on the outside of my home? Regularly fix the edges of walkways and driveways and fix any cracks. Remove anything that might make you trip as you walk through a door, such as a raised step or threshold. Trim any bushes or trees on the path to your home. Use bright outdoor lighting. Clear any walking paths of anything that might make someone trip, such as rocks or tools. Regularly check to see if handrails are loose or broken. Make sure that both sides of any steps have handrails. Any raised decks and porches should have guardrails on the edges. Have any leaves, snow, or ice cleared regularly. Use sand or salt on walking paths during winter. Clean up any spills in your garage right away. This includes oil or grease spills. What can I do in the bathroom? Use night lights. Install grab bars by the toilet and in the tub and shower. Do not use towel bars as grab bars. Use non-skid mats or decals in the tub or shower. If you need to sit down in the shower, use a plastic, non-slip stool. Keep the floor dry. Clean up any water that spills on the floor as soon as it happens. Remove soap buildup in the tub or shower regularly. Attach bath mats securely with double-sided non-slip rug tape. Do not have throw rugs and other  things on the floor that can make you trip. What can I do in the bedroom? Use night lights. Make sure that you have a light by your bed that is easy to reach. Do not use any sheets or blankets that are too big for your bed. They should not hang down onto the floor. Have a firm chair that has side arms. You can use this for support while you get dressed. Do not have throw rugs and other things on the floor that can make you trip. What can I do in the kitchen? Clean up any spills right away. Avoid walking on wet floors. Keep items that you use a lot in easy-to-reach places. If you need to reach something above you, use a strong step stool that has a grab bar. Keep electrical cords out of the way. Do not use floor polish or wax that makes floors slippery. If you must use wax, use non-skid floor wax. Do not have throw rugs and other things on the floor that can make you trip. What can I do with my stairs? Do not leave any items on the stairs. Make sure that there are handrails on both sides of the stairs and use them. Fix handrails that are broken or loose. Make sure that handrails are as long as the stairways. Check any carpeting to make sure that it is firmly attached to the stairs. Fix any carpet that is loose or worn. Avoid having throw rugs at  the top or bottom of the stairs. If you do have throw rugs, attach them to the floor with carpet tape. Make sure that you have a light switch at the top of the stairs and the bottom of the stairs. If you do not have them, ask someone to add them for you. What else can I do to help prevent falls? Wear shoes that: Do not have high heels. Have rubber bottoms. Are comfortable and fit you well. Are closed at the toe. Do not wear sandals. If you use a stepladder: Make sure that it is fully opened. Do not climb a closed stepladder. Make sure that both sides of the stepladder are locked into place. Ask someone to hold it for you, if possible. Clearly  mark and make sure that you can see: Any grab bars or handrails. First and last steps. Where the edge of each step is. Use tools that help you move around (mobility aids) if they are needed. These include: Canes. Walkers. Scooters. Crutches. Turn on the lights when you go into a dark area. Replace any light bulbs as soon as they burn out. Set up your furniture so you have a clear path. Avoid moving your furniture around. If any of your floors are uneven, fix them. If there are any pets around you, be aware of where they are. Review your medicines with your doctor. Some medicines can make you feel dizzy. This can increase your chance of falling. Ask your doctor what other things that you can do to help prevent falls. This information is not intended to replace advice given to you by your health care provider. Make sure you discuss any questions you have with your health care provider. Document Released: 09/15/2009 Document Revised: 04/26/2016 Document Reviewed: 12/24/2014 Elsevier Interactive Patient Education  2017 [employer].

## 2021-08-08 NOTE — Progress Notes (Signed)
I connected with Jillian Fritz today by telephone and verified that I am speaking with the correct person using two identifiers. Location patient: home Location provider: work Persons participating in the virtual visit: Loraine Grip, Glenna Durand LPN.   I discussed the limitations, risks, security and privacy concerns of performing an evaluation and management service by telephone and the availability of in person appointments. I also discussed with the patient that there may be a patient responsible charge related to this service. The patient expressed understanding and verbally consented to this telephonic visit.    Interactive audio and video telecommunications were attempted between this provider and patient, however failed, due to patient having technical difficulties OR patient did not have access to video capability.  We continued and completed visit with audio only.     Vital signs may be patient reported or missing.  Subjective:   Jillian Fritz is a 70 y.o. female who presents for an Initial Medicare Annual Wellness Visit.  Review of Systems     Cardiac Risk Factors include: advanced age (>31mn, >>22women);dyslipidemia;hypertension;obesity (BMI >30kg/m2)     Objective:    Today's Vitals   08/08/21 1017  Weight: 185 lb 6.4 oz (84.1 kg)  Height: 5' 5.5" (1.664 m)   Body mass index is 30.38 kg/m.  Advanced Directives 08/08/2021 04/14/2020 10/14/2018 10/12/2018 03/23/2016  Does Patient Have a Medical Advance Directive? Yes Yes Yes Yes No  Type of AParamedicof [place] Grove VillageLiving will [place] will [place] will [place]Living will -  Does patient want to make changes to medical advance directive? - No - Patient declined - - -  Copy of [place] Chart? No - copy requested No - copy requested - - -  Would patient like information on creating a medical  advance directive? - - - - No - patient declined information    Current Medications (verified) Outpatient Encounter Medications as of 08/08/2021  Medication Sig   albuterol (VENTOLIN HFA) 108 (90 Base) MCG/ACT inhaler Inhale 2 puffs into the lungs every 4 (four) hours as needed for wheezing or shortness of breath (cough).   aspirin EC 81 MG tablet Take 1 tablet (81 mg total) by mouth daily.   Cholecalciferol (VITAMIN D3) 50 MCG (2000 UT) TABS Take by mouth.   Coenzyme Q10 (COQ10) 100 MG CAPS Take by mouth.   ezetimibe (ZETIA) 10 MG tablet TAKE 1 TABLET BY MOUTH  DAILY   hydrochlorothiazide (HYDRODIURIL) 12.5 MG tablet TAKE 1 TABLET BY MOUTH  DAILY   levothyroxine (SYNTHROID) 88 MCG tablet TAKE 1 TABLET BY MOUTH DAILY BEFORE BREAKFAST.   losartan (COZAAR) 50 MG tablet TAKE 1 TABLET BY MOUTH EVERY DAY   pantoprazole (PROTONIX) 40 MG tablet TAKE 1 TABLET BY MOUTH  DAILY BEFORE BREAKFAST   Probiotic Product (PROBIOTIC DAILY PO) Take by mouth daily.   rosuvastatin (CRESTOR) 5 MG tablet TAKE 1 TABLET BY MOUTH 3  TIMES WEEKLY   No facility-administered encounter medications on file as of 08/08/2021.    Allergies (verified) Penicillins, Sulfa antibiotics, Statins, Dill oil, Lisinopril, Peach [prunus persica], Strawberry (diagnostic), and Propofol   History: Past Medical History:  Diagnosis Date   Arthritis    knuckles   Asthma    childhood - exercise induced   Dental bridge present    flexible, removable, upper, fits snuggly   GERD (gastroesophageal reflux disease)    Heart murmur    states she has  2 murmurs/no issues except when pregnant   Hypertension    controlled on meds   Hypothyroidism    Migraines    2 or 3 years ago   Motion sickness    cars, planes   Obesity    Shortness of breath dyspnea    Wears contact lenses    Past Surgical History:  Procedure Laterality Date   CERVICAL CONE BIOPSY     COLONOSCOPY WITH PROPOFOL N/A 04/14/2020   Procedure: COLONOSCOPY WITH BIOPSY;   Surgeon: [name], MD;  Location: [place];  Service: Endoscopy;  Laterality: N/A;  priority 3   ESOPHAGOGASTRODUODENOSCOPY (EGD) WITH PROPOFOL N/A 03/23/2016   Procedure: ESOPHAGOGASTRODUODENOSCOPY (EGD) WITH esophageal dilation. ;  Surgeon: [name], MD;  Location: [place];  Service: Endoscopy;  Laterality: N/A;   POLYPECTOMY N/A 04/14/2020   Procedure: POLYPECTOMY;  Surgeon: [name], MD;  Location: [place];  Service: Endoscopy;  Laterality: N/A;  2 Clips placed at [facility] site   THROAT SURGERY  04/2014   Family History  Problem Relation Age of Onset   Alzheimer's disease Father    Hypertension Mother    Breast cancer Paternal Aunt        3 pat aunts   Thyroid disease Sister    Social History   Socioeconomic History   Marital status: Single    Spouse name: Not on file   Number of children: Not on file   Years of education: Not on file   Highest education level: Not on file  Occupational History   Occupation: [employer]  Tobacco Use   Smoking status: Never   Smokeless tobacco: Never  Vaping Use   Vaping Use: Never used  Substance and Sexual Activity   Alcohol use: No    Alcohol/week: 0.0 standard drinks   Drug use: No   Sexual activity: Not Currently  Other Topics Concern   Not on file  Social History Narrative   Not on file   Social Determinants of Health   Financial Resource Strain: Low Risk    Difficulty of Paying Living Expenses: Not hard at all  Food Insecurity: No Food Insecurity   Worried About Charity fundraiser in the Last Year: Never true   [place] in the Last Year: Never true  Transportation Needs: No Transportation Needs   Lack of Transportation (Medical): No   Lack of Transportation (Non-Medical): No  Physical Activity: Sufficiently Active   Days of Exercise per Week: 3 days   Minutes of Exercise per Session: 60 min  Stress: No Stress Concern Present   Feeling of Stress  : Not at all  Social Connections: Not on file    Tobacco Counseling Counseling given: Not Answered   Clinical Intake:  Pre-visit preparation completed: Yes  Pain : No/denies pain     Nutritional Status: BMI > 30  Obese Nutritional Risks: None Diabetes: No  How often do you need to have someone help you when you read instructions, pamphlets, or other written materials from your doctor or pharmacy?: 1 - Never What is the last grade level you completed in school?: 12yr college  Diabetic? no  Interpreter Needed?: No  Information entered by :: NAllen LPN   Activities of Daily Living In your present state of health, do you have any difficulty performing the following activities: 08/08/2021  Hearing? Y  Vision? N  Difficulty concentrating or making decisions? N  Walking or climbing stairs? N  Dressing  or bathing? N  Doing errands, shopping? N  Preparing Food and eating ? N  Using the Toilet? N  In the past six months, have you accidently leaked urine? N  Do you have problems with loss of bowel control? N  Managing your Medications? N  Managing your Finances? N  Housekeeping or managing your Housekeeping? N  Some recent data might be hidden    Patient Care Team: Olin Hauser, DO as PCP - General (Family Medicine)  Indicate any recent Medical Services you may have received from other than [facility] providers in the past year (date may be approximate).     Assessment:   This is a routine wellness examination for [place].  Hearing/Vision screen Vision Screening - Comments:: No regular eye exams, [facility]  Dietary issues and exercise activities discussed: Current Exercise Habits: Home exercise routine, Type of exercise: walking, Time (Minutes): 60, Frequency (Times/Week): 3, Weekly Exercise (Minutes/Week): 180   Goals Addressed             This Visit's Progress    Patient Stated       08/08/2021, wants to weigh 150 pounds       Depression  Screen PHQ 2/9 Scores 08/08/2021 08/11/2020 08/07/2019 01/30/2019 07/30/2018 04/26/2017 01/09/2016  PHQ - 2 Score 0 0 0 0 0 0 0    Fall Risk Fall Risk  08/08/2021 08/11/2020 08/07/2019 01/30/2019 07/30/2018  Falls in the past year? 0 0 0 0 No  Number falls in past yr: - 0 - - -  Injury with Fall? - 0 - - -  Risk for fall due to : Medication side effect - - - -  Follow up Falls evaluation completed;Education provided;Falls prevention discussed Falls evaluation completed Falls evaluation completed Falls evaluation completed -    FALL RISK PREVENTION PERTAINING TO THE HOME:  Any stairs in or around the home? Yes  If so, are there any without handrails? No  Home free of loose throw rugs in walkways, pet beds, electrical cords, etc? Yes  Adequate lighting in your home to reduce risk of falls? Yes   ASSISTIVE DEVICES UTILIZED TO PREVENT FALLS:  Life alert? No  Use of a cane, walker or w/c? No  Grab bars in the bathroom? Yes  Shower chair or bench in shower? Yes  Elevated toilet seat or a handicapped toilet? Yes   TIMED UP AND GO:  Was the test performed? No .      Cognitive Function:     6CIT Screen 08/08/2021  What Year? 0 points  What month? 0 points  What time? 0 points  Count back from 20 0 points  Months in reverse 0 points  Repeat phrase 2 points  Total Score 2    Immunizations Immunization History  Administered Date(s) Administered   Fluad Quad(high Dose 65+) 08/07/2019, 08/11/2020   Influenza-Unspecified 08/13/2018   PFIZER(Purple Top)SARS-COV-2 Vaccination 02/03/2020, 02/24/2020, 11/10/2020   Pneumococcal Conjugate-13 04/26/2017   Pneumococcal Polysaccharide-23 07/30/2018   Zoster Recombinat (Shingrix) 08/30/2017, 12/20/2017    TDAP status: Due, Education has been provided regarding the importance of this vaccine. Advised may receive this vaccine at local pharmacy or Health Dept. Aware to provide a copy of the vaccination record if obtained from local pharmacy or Health Dept.  Verbalized acceptance and understanding.  Flu Vaccine status: Due, Education has been provided regarding the importance of this vaccine. Advised may receive this vaccine at local pharmacy or Health Dept. Aware to provide a copy of the vaccination  record if obtained from local pharmacy or Health Dept. Verbalized acceptance and understanding.  Pneumococcal vaccine status: Up to date  Covid-19 vaccine status: Completed vaccines  Qualifies for Shingles Vaccine? Yes   Zostavax completed No   Shingrix Completed?: Yes  Screening Tests Health Maintenance  Topic Date Due   TETANUS/TDAP  12/04/2019   INFLUENZA VACCINE  07/03/2021   COVID-19 Vaccine (4 - Booster for [place] series) 08/24/2021 (Originally 03/11/2021)   MAMMOGRAM  06/23/2022   COLONOSCOPY (Pts 45-38yr Insurance coverage will need to be confirmed)  04/16/2023   DEXA SCAN  Completed   Hepatitis C Screening  Completed   PNA vac Low Risk Adult  Completed   Zoster Vaccines- Shingrix  Completed   HPV VACCINES  Aged Out    Health Maintenance  Health Maintenance Due  Topic Date Due   TETANUS/TDAP  12/04/2019   INFLUENZA VACCINE  07/03/2021    Colorectal cancer screening: Type of screening: Colonoscopy. Completed 5/142021. Repeat every 2 years  Mammogram status: patient to schedule  Bone Density status: Completed 10/15/2017.   Lung Cancer Screening: (Low Dose CT Chest recommended if Age 69-80years, 30 pack-year currently smoking OR have quit w/in 15years.) does not qualify.   Lung Cancer Screening Referral: no  Additional Screening:  Hepatitis C Screening: does qualify; Completed 02/13/2016  Vision Screening: Recommended annual ophthalmology exams for early detection of glaucoma and other disorders of the eye. Is the patient up to date with their annual eye exam?  No  Who is the provider or what is the name of the office in which the patient attends annual eye exams? [facility] is not established with a  provider, would they like to be referred to a provider to establish care? No .   Dental Screening: Recommended annual dental exams for proper oral hygiene  Community Resource Referral / Chronic Care Management: CRR required this visit?  No   CCM required this visit?  No      Plan:     I have personally reviewed and noted the following in the patient's chart:   Medical and social history Use of alcohol, tobacco or illicit drugs  Current medications and supplements including opioid prescriptions. Patient is not currently taking opioid prescriptions. Functional ability and status Nutritional status Physical activity Advanced directives List of other physicians Hospitalizations, surgeries, and ER visits in previous 12 months Vitals Screenings to include cognitive, depression, and falls Referrals and appointments  In addition, I have reviewed and discussed with patient certain preventive protocols, quality metrics, and best practice recommendations. A written personalized care plan for preventive services as well as general preventive health recommendations were provided to patient.     NKellie Simmering LPN   [id number]  Nurse Notes:

## 2021-08-18 ENCOUNTER — Encounter: Payer: Medicare Other | Admitting: Family Medicine

## 2021-08-28 ENCOUNTER — Other Ambulatory Visit: Payer: Self-pay | Admitting: Family Medicine

## 2021-08-28 DIAGNOSIS — E034 Atrophy of thyroid (acquired): Secondary | ICD-10-CM

## 2021-08-28 NOTE — Telephone Encounter (Signed)
Requested medications are due for refill today.  yes  Requested medications are on the active medications list.  yes  Last refill. 08/06/2021  Future visit scheduled.   yes  Notes to clinic.  Labs are expired.

## 2021-08-29 ENCOUNTER — Other Ambulatory Visit: Payer: Self-pay

## 2021-08-29 DIAGNOSIS — E034 Atrophy of thyroid (acquired): Secondary | ICD-10-CM

## 2021-08-29 DIAGNOSIS — E559 Vitamin D deficiency, unspecified: Secondary | ICD-10-CM

## 2021-08-29 DIAGNOSIS — R7309 Other abnormal glucose: Secondary | ICD-10-CM

## 2021-08-29 DIAGNOSIS — E782 Mixed hyperlipidemia: Secondary | ICD-10-CM

## 2021-08-30 ENCOUNTER — Other Ambulatory Visit: Payer: Self-pay | Admitting: Family Medicine

## 2021-08-30 DIAGNOSIS — E559 Vitamin D deficiency, unspecified: Secondary | ICD-10-CM | POA: Diagnosis not present

## 2021-08-30 DIAGNOSIS — E782 Mixed hyperlipidemia: Secondary | ICD-10-CM | POA: Diagnosis not present

## 2021-08-30 DIAGNOSIS — R7309 Other abnormal glucose: Secondary | ICD-10-CM | POA: Diagnosis not present

## 2021-08-30 DIAGNOSIS — E034 Atrophy of thyroid (acquired): Secondary | ICD-10-CM | POA: Diagnosis not present

## 2021-08-30 DIAGNOSIS — I129 Hypertensive chronic kidney disease with stage 1 through stage 4 chronic kidney disease, or unspecified chronic kidney disease: Secondary | ICD-10-CM

## 2021-08-30 DIAGNOSIS — Z1231 Encounter for screening mammogram for malignant neoplasm of breast: Secondary | ICD-10-CM

## 2021-08-30 NOTE — Telephone Encounter (Signed)
Requested medications are due for refill today.  yes  Requested medications are on the active medications list.  yes  Last refill. 09/25/2020  Future visit scheduled.   yes  Notes to clinic.  Pt is more than 3 months overdue for office visit. Labs are expired.

## 2021-08-31 LAB — VITAMIN D 25 HYDROXY (VIT D DEFICIENCY, FRACTURES): Vit D, 25-Hydroxy: 65.3 ng/mL (ref 30.0–100.0)

## 2021-08-31 LAB — COMPREHENSIVE METABOLIC PANEL
ALT: 16 IU/L (ref 0–32)
AST: 18 IU/L (ref 0–40)
Albumin/Globulin Ratio: 2.6 — ABNORMAL HIGH (ref 1.2–2.2)
Albumin: 4.1 g/dL (ref 3.8–4.8)
Alkaline Phosphatase: 84 IU/L (ref 44–121)
BUN/Creatinine Ratio: 13 (ref 12–28)
BUN: 11 mg/dL (ref 8–27)
Bilirubin Total: 0.4 mg/dL (ref 0.0–1.2)
CO2: 21 mmol/L (ref 20–29)
Calcium: 9 mg/dL (ref 8.7–10.3)
Chloride: 106 mmol/L (ref 96–106)
Creatinine, Ser: 0.85 mg/dL (ref 0.57–1.00)
Globulin, Total: 1.6 g/dL (ref 1.5–4.5)
Glucose: 89 mg/dL (ref 70–99)
Potassium: 4.3 mmol/L (ref 3.5–5.2)
Sodium: 141 mmol/L (ref 134–144)
Total Protein: 5.7 g/dL — ABNORMAL LOW (ref 6.0–8.5)
eGFR: 74 mL/min/{1.73_m2} (ref >59)

## 2021-08-31 LAB — CBC WITH DIFFERENTIAL/PLATELET
Basophils Absolute: 0.1 10*3/uL (ref 0.0–0.2)
Basos: 1 %
EOS (ABSOLUTE): 0.9 10*3/uL — ABNORMAL HIGH (ref 0.0–0.4)
Eos: 11 %
Hematocrit: 36.1 % (ref 34.0–46.6)
Hemoglobin: 11.9 g/dL (ref 11.1–15.9)
Immature Grans (Abs): 0 10*3/uL (ref 0.0–0.1)
Immature Granulocytes: 0 %
Lymphocytes Absolute: 2.7 10*3/uL (ref 0.7–3.1)
Lymphs: 34 %
MCH: 30.3 pg (ref 26.6–33.0)
MCHC: 33 g/dL (ref 31.5–35.7)
MCV: 92 fL (ref 79–97)
Monocytes Absolute: 0.5 10*3/uL (ref 0.1–0.9)
Monocytes: 6 %
Neutrophils Absolute: 3.7 10*3/uL (ref 1.4–7.0)
Neutrophils: 48 %
Platelets: 198 10*3/uL (ref 150–450)
RBC: 3.93 x10E6/uL (ref 3.77–5.28)
RDW: 13.4 % (ref 11.7–15.4)
WBC: 7.8 10*3/uL (ref 3.4–10.8)

## 2021-08-31 LAB — LIPID PANEL
Chol/HDL Ratio: 2.6 ratio (ref 0.0–4.4)
Cholesterol, Total: 179 mg/dL (ref 100–199)
HDL: 70 mg/dL (ref >39)
LDL Chol Calc (NIH): 89 mg/dL (ref 0–99)
Triglycerides: 112 mg/dL (ref 0–149)
VLDL Cholesterol Cal: 20 mg/dL (ref 5–40)

## 2021-08-31 LAB — HEMOGLOBIN A1C
Est. average glucose Bld gHb Est-mCnc: 111 mg/dL
Hgb A1c MFr Bld: 5.5 % (ref 4.8–5.6)

## 2021-08-31 LAB — TSH: TSH: 1.52 u[IU]/mL (ref 0.450–4.500)

## 2021-08-31 LAB — T4, FREE: Free T4: 1.43 ng/dL (ref 0.82–1.77)

## 2021-09-08 ENCOUNTER — Other Ambulatory Visit: Payer: Self-pay

## 2021-09-08 ENCOUNTER — Encounter: Payer: Self-pay | Admitting: Family Medicine

## 2021-09-08 ENCOUNTER — Ambulatory Visit (INDEPENDENT_AMBULATORY_CARE_PROVIDER_SITE_OTHER): Payer: Medicare Other | Admitting: Family Medicine

## 2021-09-08 VITALS — BP 131/67 | HR 61 | Ht 65.5 in | Wt 184.8 lb

## 2021-09-08 DIAGNOSIS — Z0000 Encounter for general adult medical examination without abnormal findings: Secondary | ICD-10-CM

## 2021-09-08 DIAGNOSIS — N182 Chronic kidney disease, stage 2 (mild): Secondary | ICD-10-CM

## 2021-09-08 DIAGNOSIS — K219 Gastro-esophageal reflux disease without esophagitis: Secondary | ICD-10-CM | POA: Diagnosis not present

## 2021-09-08 DIAGNOSIS — Z23 Encounter for immunization: Secondary | ICD-10-CM

## 2021-09-08 DIAGNOSIS — E782 Mixed hyperlipidemia: Secondary | ICD-10-CM | POA: Diagnosis not present

## 2021-09-08 DIAGNOSIS — K222 Esophageal obstruction: Secondary | ICD-10-CM | POA: Diagnosis not present

## 2021-09-08 DIAGNOSIS — E034 Atrophy of thyroid (acquired): Secondary | ICD-10-CM | POA: Diagnosis not present

## 2021-09-08 DIAGNOSIS — I129 Hypertensive chronic kidney disease with stage 1 through stage 4 chronic kidney disease, or unspecified chronic kidney disease: Secondary | ICD-10-CM

## 2021-09-08 MED ORDER — PANTOPRAZOLE SODIUM 40 MG PO TBEC: 40.0000 mg | DELAYED_RELEASE_TABLET | Freq: Every day | ORAL | 3 refills | Status: DC

## 2021-09-08 MED ORDER — EZETIMIBE 10 MG PO TABS: 10.0000 mg | ORAL_TABLET | Freq: Every day | ORAL | 3 refills | Status: DC

## 2021-09-08 MED ORDER — HYDROCHLOROTHIAZIDE 12.5 MG PO TABS: 12.5000 mg | ORAL_TABLET | Freq: Every day | ORAL | 3 refills | Status: DC

## 2021-09-08 MED ORDER — LEVOTHYROXINE SODIUM 88 MCG PO TABS: 88.0000 ug | ORAL_TABLET | Freq: Every day | ORAL | 3 refills | Status: DC

## 2021-09-08 MED ORDER — ROSUVASTATIN CALCIUM 5 MG PO TABS: ORAL_TABLET | ORAL | 3 refills | Status: DC

## 2021-09-08 MED ORDER — LOSARTAN POTASSIUM 50 MG PO TABS: 50.0000 mg | ORAL_TABLET | Freq: Every day | ORAL | 3 refills | Status: DC

## 2021-09-08 NOTE — Assessment & Plan Note (Signed)
Very well controlled lipids now Last lipid panel 09/2021 The 10-year ASCVD risk score (Arnett DK, et al., 2019) is: 10.8%  Plan: 1. Continue Rosuvastatin 5mg  TIW and Zetia 2. Encourage improved lifestyle - low carb/cholesterol, reduce portion size, continue improving regular exercise

## 2021-09-08 NOTE — Progress Notes (Signed)
Subjective:    Patient ID: Jillian Fritz, female    DOB: Nov 15, 1951, 70 y.o.   MRN: [id number]  Jillian Fritz is a 70 y.o. female presenting on 09/08/2021 for Annual Exam   HPI  Here for Annual Physical and Lab Review  CHRONIC HTN with CKD-III Last lab showed stable Creatinine 0.97 - Today patient reports still overall doing well with home BP readings controlled Current Meds - Losartan 63m daily, HCTZ 12.534mdaily Tolerating well, w/o complaints. - Still improved edema Not taking NSAIDs   GERD Previously controlled. had esophageal dilatation in past, off PPI worsening, request med refill   History of Heart Murmur Not heard today, she takes preventative antibiotic from dentist before cleaning, no heart procedure or valve issue, can stop or do ECHO   Elevated A1c Improved A1c to 5.5 now Meds: not on meds Currently on ARB Limited regular exercise, plans to improve this in future when retire.   HYPERLIPIDEMIA: prior elevated LDL and total cholesterol, previously improved on meds Significant improvement now with improved diet lifestyle, wt loss and GoLow, walking more - taking Zetia 10 and Rosuva 102m9mntermittent - last lab showed elevated LDL still    Hypothyroidism: Last labs 09/2021 TSH and Free T4 normal Continues on Levothyroxine 2m6102maily - No significant symptoms   Osteopenia, spine / Vitamin D Deficiency Vitamin D reduced to 29 on last lab, still taking Vit D supplement unsure dose  Additional complaint Bilateral swelling in both ankles   Health Maintenance:  Mammogram next week 09/13/21  Due for Flu Shot, will receive today   Hold off on COVID Vaccine booster today, consider in future.  Next Colonoscopy anticipated in 5 years from last 04/15/20, would be 2026, can follow up sooner if need  Depression screen PHQ [facility] 08/08/2021 08/11/2020 08/07/2019  Decreased Interest 0 0 0  Down, Depressed, Hopeless 0 0 0  PHQ - 2 Score 0 0 0    Past Medical  History:  Diagnosis Date   Arthritis    knuckles   Asthma    childhood - exercise induced   Dental bridge present    flexible, removable, upper, fits snuggly   GERD (gastroesophageal reflux disease)    Heart murmur    states she has 2 murmurs/no issues except when pregnant   Hypertension    controlled on meds   Hypothyroidism    Migraines    2 or 3 years ago   Motion sickness    cars, planes   Obesity    Shortness of breath dyspnea    Wears contact lenses    Past Surgical History:  Procedure Laterality Date   CERVICAL CONE BIOPSY     COLONOSCOPY WITH PROPOFOL N/A 04/14/2020   Procedure: COLONOSCOPY WITH BIOPSY;  Surgeon: WohlLucilla Lame;  [facility];  Laterality: N/A;  priority 3   ESOPHAGOGASTRODUODENOSCOPY (EGD) WITH PROPOFOL N/A 03/23/2016   Procedure: ESOPHAGOGASTRODUODENOSCOPY (EGD) WITH esophageal dilation. ;  Surgeon: DarrLucilla Lame;  [facility];  Laterality: N/A;   POLYPECTOMY N/A 04/14/2020   Procedure: POLYPECTOMY;  Surgeon: WohlLucilla Lame;  [facility]oscopy;  Laterality: N/A;  2 Clips placed at [facility] site   THROAT SURGERY  04/2014   Social History   Socioeconomic History   Marital status: Single    Spouse name: Not on file   Number of children: Not on file   Years of  education: Not on file   Highest education level: Not on file  Occupational History   Occupation: [employer]  Tobacco Use   Smoking status: Never   Smokeless tobacco: Never  Vaping Use   Vaping Use: Never used  Substance and Sexual Activity   Alcohol use: No    Alcohol/week: 0.0 standard drinks   Drug use: No   Sexual activity: Not Currently  Other Topics Concern   Not on file  Social History Narrative   Not on file   Social Determinants of Health   Financial Resource Strain: Low Risk    Difficulty of Paying Living Expenses: Not hard at all   Food Insecurity: No Food Insecurity   Worried About Charity fundraiser in the Last Year: Never true   [place] in the Last Year: Never true  Transportation Needs: No Transportation Needs   Lack of Transportation (Medical): No   Lack of Transportation (Non-Medical): No  Physical Activity: Sufficiently Active   Days of Exercise per Week: 3 days   Minutes of Exercise per Session: 60 min  Stress: No Stress Concern Present   Feeling of Stress : Not at all  Social Connections: Not on file  Intimate Partner Violence: Not on file   Family History  Problem Relation Age of Onset   Alzheimer's disease Father    Hypertension Mother    Breast cancer Paternal Aunt        3 pat aunts   Thyroid disease Sister    Current Outpatient Medications on File Prior to Visit  Medication Sig   albuterol (VENTOLIN HFA) 108 (90 Base) MCG/ACT inhaler Inhale 2 puffs into the lungs every 4 (four) hours as needed for wheezing or shortness of breath (cough).   aspirin EC 81 MG tablet Take 1 tablet (81 mg total) by mouth daily.   Cholecalciferol (VITAMIN D3) 50 MCG (2000 UT) TABS Take by mouth.   Coenzyme Q10 (COQ10) 100 MG CAPS Take by mouth.   Probiotic Product (PROBIOTIC DAILY PO) Take by mouth daily.   No current facility-administered medications on file prior to visit.    Review of Systems  Constitutional:  Negative for activity change, appetite change, chills, diaphoresis, fatigue and fever.  HENT:  Negative for congestion and hearing loss.   Eyes:  Negative for visual disturbance.  Respiratory:  Negative for cough, chest tightness, shortness of breath and wheezing.   Cardiovascular:  Negative for chest pain, palpitations and leg swelling.  Gastrointestinal:  Negative for abdominal pain, constipation, diarrhea, nausea and vomiting.  Genitourinary:  Negative for dysuria, frequency and hematuria.  Musculoskeletal:  Negative for arthralgias and neck pain.  Skin:  Negative for rash.   Neurological:  Negative for dizziness, weakness, light-headedness, numbness and headaches.  Hematological:  Negative for adenopathy.  Psychiatric/Behavioral:  Negative for behavioral problems, dysphoric mood and sleep disturbance.   Per HPI unless specifically indicated above      Objective:    BP 131/67   Pulse 61   Ht 5' 5.5" (1.664 m)   Wt 184 lb 12.8 oz (83.8 kg)   SpO2 100%   BMI 30.28 kg/m   Wt Readings from Last 3 Encounters:  09/08/21 184 lb 12.8 oz (83.8 kg)  08/08/21 185 lb 6.4 oz (84.1 kg)  08/11/20 188 lb 9.6 oz (85.5 kg)    Physical Exam Vitals and nursing note reviewed.  Constitutional:      General: She is not in acute distress.    Appearance:  She is well-developed. She is not diaphoretic.     Comments: Well-appearing, comfortable, cooperative  HENT:     Head: Normocephalic and atraumatic.  Eyes:     General:        Right eye: No discharge.        Left eye: No discharge.     Conjunctiva/sclera: Conjunctivae normal.     Pupils: Pupils are equal, round, and reactive to light.  Neck:     Thyroid: No thyromegaly.  Cardiovascular:     Rate and Rhythm: Normal rate and regular rhythm.     Pulses: Normal pulses.     Heart sounds: Normal heart sounds. No murmur heard. Pulmonary:     Effort: Pulmonary effort is normal. No respiratory distress.     Breath sounds: Normal breath sounds. No wheezing or rales.  Abdominal:     General: Bowel sounds are normal. There is no distension.     Palpations: Abdomen is soft. There is no mass.     Tenderness: There is no abdominal tenderness.  Musculoskeletal:        General: No tenderness. Normal range of motion.     Cervical back: Normal range of motion and neck supple.     Right lower leg: No edema.     Left lower leg: No edema.     Comments: Lower ext spider vein  Upper / Lower Extremities: - Normal muscle tone, strength bilateral upper extremities 5/5, lower extremities 5/5  Lymphadenopathy:     Cervical: No  cervical adenopathy.  Skin:    General: Skin is warm and dry.     Findings: No erythema or rash.  Neurological:     Mental Status: She is alert and oriented to person, place, and time.     Comments: Distal sensation intact to light touch all extremities  Psychiatric:        Mood and Affect: Mood normal.        Behavior: Behavior normal.        Thought Content: Thought content normal.     Comments: Well groomed, good eye contact, normal speech and thoughts   Results for orders placed or performed in visit on 08/29/21  HgB A1c  Result Value Ref Range   Hgb A1c MFr Bld 5.5 4.8 - 5.6 %   Est. average glucose Bld gHb Est-mCnc 111 mg/dL  CBC with Differential  Result Value Ref Range   WBC 7.8 3.4 - 10.8 x10E3/uL   RBC 3.93 3.77 - 5.28 x10E6/uL   Hemoglobin 11.9 11.1 - 15.9 g/dL   Hematocrit 36.1 34.0 - 46.6 %   MCV 92 79 - 97 fL   MCH 30.3 26.6 - 33.0 pg   MCHC 33.0 31.5 - 35.7 g/dL   RDW 13.4 11.7 - 15.4 %   Platelets 198 150 - 450 x10E3/uL   Neutrophils 48 Not Estab. %   Lymphs 34 Not Estab. %   Monocytes 6 Not Estab. %   Eos 11 Not Estab. %   Basos 1 Not Estab. %   Neutrophils Absolute 3.7 1.4 - 7.0 x10E3/uL   Lymphocytes Absolute 2.7 0.7 - 3.1 x10E3/uL   Monocytes Absolute 0.5 0.1 - 0.9 x10E3/uL   EOS (ABSOLUTE) 0.9 (H) 0.0 - 0.4 x10E3/uL   Basophils Absolute 0.1 0.0 - 0.2 x10E3/uL   Immature Granulocytes 0 Not Estab. %   Immature Grans (Abs) 0.0 0.0 - 0.1 x10E3/uL  Lipid panel  Result Value Ref Range   Cholesterol, Total 179 100 -  199 mg/dL   Triglycerides 112 0 - 149 mg/dL   HDL 70 >39 mg/dL   VLDL Cholesterol Cal 20 5 - 40 mg/dL   LDL Chol Calc (NIH) 89 0 - 99 mg/dL   Chol/HDL Ratio 2.6 0.0 - 4.4 ratio  Comprehensive Metabolic Panel (CMET)  Result Value Ref Range   Glucose 89 70 - 99 mg/dL   BUN 11 8 - 27 mg/dL   Creatinine, Ser 0.85 0.57 - 1.00 mg/dL   eGFR 74 >59 mL/min/1.73   BUN/Creatinine Ratio 13 12 - 28   Sodium 141 134 - 144 mmol/L   Potassium 4.3 3.5  - 5.2 mmol/L   Chloride 106 96 - 106 mmol/L   CO2 21 20 - 29 mmol/L   Calcium 9.0 8.7 - 10.3 mg/dL   Total Protein 5.7 (L) 6.0 - 8.5 g/dL   Albumin 4.1 3.8 - 4.8 g/dL   Globulin, Total 1.6 1.5 - 4.5 g/dL   Albumin/Globulin Ratio 2.6 (H) 1.2 - 2.2   Bilirubin Total 0.4 0.0 - 1.2 mg/dL   Alkaline Phosphatase 84 44 - 121 IU/L   AST 18 0 - 40 IU/L   ALT 16 0 - 32 IU/L  TSH  Result Value Ref Range   TSH 1.520 0.450 - 4.500 uIU/mL  T4, free  Result Value Ref Range   Free T4 1.43 0.82 - 1.77 ng/dL  Vitamin D (25 hydroxy)  Result Value Ref Range   Vit D, 25-Hydroxy 65.3 30.0 - 100.0 ng/mL      Assessment & Plan:   Problem List Items Addressed This Visit     Mixed hyperlipidemia    Very well controlled lipids now Last lipid panel 09/2021 The 10-year ASCVD risk score (Arnett DK, et al., 2019) is: 10.8%  Plan: 1. Continue Rosuvastatin 104m TIW and Zetia 2. Encourage improved lifestyle - low carb/cholesterol, reduce portion size, continue improving regular exercise      Relevant Medications   ezetimibe (ZETIA) 10 MG tablet   hydrochlorothiazide (HYDRODIURIL) 12.5 MG tablet   losartan (COZAAR) 50 MG tablet   rosuvastatin (CRESTOR) 5 MG tablet   Hypothyroidism    Controlled hypothyroidism Last lab normal Continue Levothyroxine 885m daily      Relevant Medications   levothyroxine (SYNTHROID) 88 MCG tablet   GERD with stricture    Stable Refill PPI      Relevant Medications   pantoprazole (PROTONIX) 40 MG tablet   Benign hypertension with CKD (chronic kidney disease), stage II    Well-controlled HTN - Home BP readings improved control  Complication CKD-II    Plan:  1. Continue current BP regimen - Losartan 5039maily and HCTZ 12.5mg60mily - refill 2. Encourage improved lifestyle - low sodium diet, regular exercise 3. Continue monitor BP outside office, bring readings to next visit, if persistently >140/90 or new symptoms notify office sooner      Relevant  Medications   ezetimibe (ZETIA) 10 MG tablet   hydrochlorothiazide (HYDRODIURIL) 12.5 MG tablet   losartan (COZAAR) 50 MG tablet   rosuvastatin (CRESTOR) 5 MG tablet   Other Visit Diagnoses     Annual physical exam    -  Primary   Needs flu shot       Relevant Orders   Flu Vaccine QUAD High Dose(Fluad)       Updated Health Maintenance information High dose Flu Shot Upcoming Mammogram 09/13/21 Reviewed recent lab results with patient Encouraged improvement to lifestyle with diet and exercise Goal of weight loss  Meds ordered this encounter  Medications   ezetimibe (ZETIA) 10 MG tablet    Sig: Take 1 tablet (10 mg total) by mouth daily.    Dispense:  90 tablet    Refill:  3    Requesting 1 year supply   hydrochlorothiazide (HYDRODIURIL) 12.5 MG tablet    Sig: Take 1 tablet (12.5 mg total) by mouth daily.    Dispense:  90 tablet    Refill:  3    Requesting 1 year supply   levothyroxine (SYNTHROID) 88 MCG tablet    Sig: Take 1 tablet (88 mcg total) by mouth daily before breakfast.    Dispense:  90 tablet    Refill:  3   losartan (COZAAR) 50 MG tablet    Sig: Take 1 tablet (50 mg total) by mouth daily.    Dispense:  90 tablet    Refill:  3   pantoprazole (PROTONIX) 40 MG tablet    Sig: Take 1 tablet (40 mg total) by mouth daily before breakfast.    Dispense:  90 tablet    Refill:  3    Requesting 1 year supply   rosuvastatin (CRESTOR) 5 MG tablet    Sig: TAKE 1 TABLET BY MOUTH 3  TIMES WEEKLY    Dispense:  36 tablet    Refill:  3      Follow up plan: Return in about 1 year (around 09/08/2022) for 1 year fasting lab only then 1 week later Annual Physical.  [name], DO [facility] 09/08/2021, 9:12 AM

## 2021-09-08 NOTE — Assessment & Plan Note (Signed)
Well-controlled HTN - Home BP readings improved control  Complication CKD-II    Plan:  1. Continue current BP regimen - Losartan 50mg daily and HCTZ 12.5mg daily - refill 2. Encourage improved lifestyle - low sodium diet, regular exercise 3. Continue monitor BP outside office, bring readings to next visit, if persistently >140/90 or new symptoms notify office sooner 

## 2021-09-08 NOTE — Assessment & Plan Note (Signed)
Stable Refill PPI

## 2021-09-08 NOTE — Assessment & Plan Note (Signed)
Controlled hypothyroidism Last lab normal Continue Levothyroxine 88mcg daily 

## 2021-09-08 NOTE — Patient Instructions (Addendum)
Thank you for coming to the office today.  Check cost of Tetanus TDap vaccine if you like.  Future COVID booster updated version if you change mind  Today Flu Shot  Labs look amazing!  Refilled all medications to [facility] for 90 day plus year of refills  START anti inflammatory topical - OTC Voltaren (generic Diclofenac) topical 2-4 times a day as needed for pain swelling of affected joint for 1-2 weeks or longer.   DUE for FASTING BLOOD WORK (no food or drink after midnight before the lab appointment, only water or coffee without cream/sugar on the morning of)  SCHEDULE "Lab Only" visit in the morning at the clinic for lab draw in 1 YEAR  - Make sure Lab Only appointment is at about 1 week before your next appointment, so that results will be available  For Lab Results, once available within 2-3 days of blood draw, you can can log in to MyChart online to view your results and a brief explanation. Also, we can discuss results at next follow-up visit.   Please schedule a Follow-up Appointment to: Return in about 1 year (around 09/08/2022) for 1 year fasting lab only then 1 week later Annual Physical.  If you have any other questions or concerns, please feel free to call the office or send a message through [place]. You may also schedule an earlier appointment if necessary.  Additionally, you may be receiving a survey about your experience at our office within a few days to 1 week by e-mail or mail. We value your feedback.  [name], DO [place]

## 2021-09-13 ENCOUNTER — Ambulatory Visit
Admission: RE | Admit: 2021-09-13 | Discharge: 2021-09-13 | Disposition: A | Discharge status: Home / Self Care | Payer: Medicare Other | Source: Ambulatory Visit | Attending: Family Medicine | Admitting: Family Medicine

## 2021-09-13 ENCOUNTER — Other Ambulatory Visit: Payer: Self-pay

## 2021-09-13 DIAGNOSIS — Z1231 Encounter for screening mammogram for malignant neoplasm of breast: Secondary | ICD-10-CM | POA: Diagnosis not present

## 2021-09-18 ENCOUNTER — Other Ambulatory Visit: Payer: Self-pay | Admitting: Family Medicine

## 2021-09-18 DIAGNOSIS — R928 Other abnormal and inconclusive findings on diagnostic imaging of breast: Secondary | ICD-10-CM

## 2021-09-18 DIAGNOSIS — N6489 Other specified disorders of breast: Secondary | ICD-10-CM

## 2021-09-20 ENCOUNTER — Other Ambulatory Visit: Payer: Self-pay | Admitting: Family Medicine

## 2021-09-20 DIAGNOSIS — E034 Atrophy of thyroid (acquired): Secondary | ICD-10-CM

## 2021-09-21 NOTE — Telephone Encounter (Signed)
Verified with pharmacy tech.Jillian Fritz prescription was received on 09/08/21 with 3 refills on file.This is a duplicate request and not needed-refused.

## 2021-09-29 ENCOUNTER — Other Ambulatory Visit: Payer: Self-pay

## 2021-09-29 ENCOUNTER — Ambulatory Visit
Admission: RE | Admit: 2021-09-29 | Discharge: 2021-09-29 | Disposition: A | Discharge status: Home / Self Care | Payer: Medicare Other | Source: Ambulatory Visit | Attending: Family Medicine | Admitting: Family Medicine

## 2021-09-29 DIAGNOSIS — R928 Other abnormal and inconclusive findings on diagnostic imaging of breast: Secondary | ICD-10-CM | POA: Diagnosis not present

## 2021-09-29 DIAGNOSIS — N6489 Other specified disorders of breast: Secondary | ICD-10-CM | POA: Insufficient documentation

## 2021-09-29 DIAGNOSIS — R922 Inconclusive mammogram: Secondary | ICD-10-CM | POA: Diagnosis not present

## 2021-11-01 ENCOUNTER — Encounter: Payer: Self-pay | Admitting: Family Medicine

## 2021-11-10 ENCOUNTER — Encounter: Payer: Self-pay | Admitting: Family Medicine

## 2021-11-10 ENCOUNTER — Other Ambulatory Visit: Payer: Self-pay

## 2021-11-10 ENCOUNTER — Ambulatory Visit (INDEPENDENT_AMBULATORY_CARE_PROVIDER_SITE_OTHER): Payer: Medicare Other | Admitting: Family Medicine

## 2021-11-10 VITALS — BP 139/76 | HR 71 | Ht 65.5 in | Wt 190.8 lb

## 2021-11-10 DIAGNOSIS — M170 Bilateral primary osteoarthritis of knee: Secondary | ICD-10-CM

## 2021-11-10 DIAGNOSIS — J019 Acute sinusitis, unspecified: Secondary | ICD-10-CM | POA: Diagnosis not present

## 2021-11-10 DIAGNOSIS — M25562 Pain in left knee: Secondary | ICD-10-CM

## 2021-11-10 MED ORDER — IPRATROPIUM BROMIDE 0.06 % NA SOLN: 2.0000 | Freq: Four times a day (QID) | NASAL | 0 refills | Status: DC

## 2021-11-10 NOTE — Patient Instructions (Addendum)
Thank you for coming to the office today.  You most likely have Osteoarthritis / Degenerative Knee pain.  X-ray Next week - first come first serve - no apt required Mon-Thurs 8am-4pm, not during 12-1pm during lunch  Left knee x-ray  - I recommend a Knee Brace to support your knee and limit motion of the knee to help it heal, avoid excess weight bearing and repetitive activities on it  Recommend trial of Anti-inflammatory with Aleve OTC vs Naproxen OTC generic 220 or 250mg  tabs - take one to two with food and plenty of water TWICE daily every day (breakfast and dinner), for next 1 to 2 weeks, then you may take only as needed - DO NOT TAKE any ibuprofen, motrin while you are taking this medicine  - It is safe to take Tylenol Ext Str 500mg  tabs - take 1 to 2 (max dose 1000mg ) every 6 hours as needed for breakthrough pain, max 24 hour daily dose is about 6 tablets goal is keep at or under 3000mg  in 24 hours  If pain due to daily activity you can do heating pad  If SWOLLEN due to activity - in the evening you can do these Use RICE therapy: - R - Rest / relative rest with activity modification avoid overuse and frequent bending or pressure on bent knee - I - Ice packs (make sure you use a towel or sock / something to protect skin) - C - Compression with ACE wrap or immobilizer apply pressure and reduce swelling allowing more support - E - Elevation - if significant swelling, lift leg above heart level (toes above your nose) to help reduce swelling, most helpful at night after day of being on your feet  If needed eval more urgently can go to Ortho URgent Care Walk In  [facility] Address: [address], [place], [place] [address] Hours:  9AM-5PM Phone: [phone]  Orthopedics should also have an Urgent Care Walk In Availability for after hours.  Please schedule a Follow-up Appointment to: Return in about 4 weeks (around 12/08/2021), or if symptoms  worsen or fail to improve, for within 4 weeks follow-up knee pain.  If you have any other questions or concerns, please feel free to call the office or send a message through [place]. You may also schedule an earlier appointment if necessary.  Additionally, you may be receiving a survey about your experience at our office within a few days to 1 week by e-mail or mail. We value your feedback.  [name], DO [facility]

## 2021-11-10 NOTE — Progress Notes (Signed)
Subjective:    Patient ID: Jillian Fritz, female    DOB: 1951/03/18, 70 y.o.   MRN: [id number]  Jillian Fritz is a 70 y.o. female presenting on 11/10/2021 for Knee Pain   HPI  Left Knee Pain and Swelling Reports about 3-4 weeks ago with worsening L knee pain episodic swelling. Some good days and some bad days. Inner medial aspect soreness and pressure behind kneecap. - Describes sitting will get stiffness and then with movement to get up will get worse - Tried a knee sleeve, but it was worse with too tight compression - Worse on Thanksgiving when on feet for prolonged. - Walking 1 mile most days with good results. - Tried Voltaren topical 1-3 times a day without relief. Tried Aspercreme some relief - Tried heating pad with some relief. - Tried elevation or keeping down, unsure No recent history of X-ray imaging - Admits swelling but no erythema   Depression screen [facility] 2/9 11/10/2021 08/08/2021 08/11/2020  Decreased Interest 0 0 0  Down, Depressed, Hopeless 0 0 0  PHQ - 2 Score 0 0 0  Altered sleeping 0 - -  Tired, decreased energy 0 - -  Change in appetite 0 - -  Feeling bad or failure about yourself  0 - -  Trouble concentrating 0 - -  Moving slowly or fidgety/restless 0 - -  Suicidal thoughts 0 - -  PHQ-9 Score 0 - -  Difficult doing work/chores Not difficult at all - -    Social History   Tobacco Use   Smoking status: Never   Smokeless tobacco: Never  Vaping Use   Vaping Use: Never used  Substance Use Topics   Alcohol use: No    Alcohol/week: 0.0 standard drinks   Drug use: No    Review of Systems Per HPI unless specifically indicated above     Objective:    BP 139/76   Pulse 71   Ht 5' 5.5" (1.664 m)   Wt 190 lb 12.8 oz (86.5 kg)   SpO2 98%   BMI 31.27 kg/m   Wt Readings from Last 3 Encounters:  11/10/21 190 lb 12.8 oz (86.5 kg)  09/08/21 184 lb 12.8 oz (83.8 kg)  08/08/21 185 lb 6.4 oz (84.1 kg)    Physical Exam Results for orders placed  or performed in visit on 08/29/21  HgB A1c  Result Value Ref Range   Hgb A1c MFr Bld 5.5 4.8 - 5.6 %   Est. average glucose Bld gHb Est-mCnc 111 mg/dL  CBC with Differential  Result Value Ref Range   WBC 7.8 3.4 - 10.8 x10E3/uL   RBC 3.93 3.77 - 5.28 x10E6/uL   Hemoglobin 11.9 11.1 - 15.9 g/dL   Hematocrit 36.1 34.0 - 46.6 %   MCV 92 79 - 97 fL   MCH 30.3 26.6 - 33.0 pg   MCHC 33.0 31.5 - 35.7 g/dL   RDW 13.4 11.7 - 15.4 %   Platelets 198 150 - 450 x10E3/uL   Neutrophils 48 Not Estab. %   Lymphs 34 Not Estab. %   Monocytes 6 Not Estab. %   Eos 11 Not Estab. %   Basos 1 Not Estab. %   Neutrophils Absolute 3.7 1.4 - 7.0 x10E3/uL   Lymphocytes Absolute 2.7 0.7 - 3.1 x10E3/uL   Monocytes Absolute 0.5 0.1 - 0.9 x10E3/uL   EOS (ABSOLUTE) 0.9 (H) 0.0 - 0.4 x10E3/uL   Basophils Absolute 0.1 0.0 - 0.2 x10E3/uL   Immature Granulocytes  0 Not Estab. %   Immature Grans (Abs) 0.0 0.0 - 0.1 x10E3/uL  Lipid panel  Result Value Ref Range   Cholesterol, Total 179 100 - 199 mg/dL   Triglycerides 112 0 - 149 mg/dL   HDL 70 >39 mg/dL   VLDL Cholesterol Cal 20 5 - 40 mg/dL   LDL Chol Calc (NIH) 89 0 - 99 mg/dL   Chol/HDL Ratio 2.6 0.0 - 4.4 ratio  Comprehensive Metabolic Panel (CMET)  Result Value Ref Range   Glucose 89 70 - 99 mg/dL   BUN 11 8 - 27 mg/dL   Creatinine, Ser 0.85 0.57 - 1.00 mg/dL   eGFR 74 >59 mL/min/1.73   BUN/Creatinine Ratio 13 12 - 28   Sodium 141 134 - 144 mmol/L   Potassium 4.3 3.5 - 5.2 mmol/L   Chloride 106 96 - 106 mmol/L   CO2 21 20 - 29 mmol/L   Calcium 9.0 8.7 - 10.3 mg/dL   Total Protein 5.7 (L) 6.0 - 8.5 g/dL   Albumin 4.1 3.8 - 4.8 g/dL   Globulin, Total 1.6 1.5 - 4.5 g/dL   Albumin/Globulin Ratio 2.6 (H) 1.2 - 2.2   Bilirubin Total 0.4 0.0 - 1.2 mg/dL   Alkaline Phosphatase 84 44 - 121 IU/L   AST 18 0 - 40 IU/L   ALT 16 0 - 32 IU/L  TSH  Result Value Ref Range   TSH 1.520 0.450 - 4.500 uIU/mL  T4, free  Result Value Ref Range   Free T4 1.43 0.82  - 1.77 ng/dL  Vitamin D (25 hydroxy)  Result Value Ref Range   Vit D, 25-Hydroxy 65.3 30.0 - 100.0 ng/mL      Assessment & Plan:   Problem List Items Addressed This Visit   None Visit Diagnoses     Left medial knee pain    -  Primary   Relevant Orders   DG Knee Complete 4 Views Left   Primary osteoarthritis of both knees       Relevant Orders   DG Knee Complete 4 Views Left       Subacute on chronic L medial Knee pain and swelling without known injury or trauma  Suspected OA DJD Less likely meniscus but cannot rule out - Able to bear weight, no knee instability and no mechanical locking - No prior history of knee surgery, arthroscopy - Inadequate conservative therapy   Plan: 1. Start anti-inflammatory trial with OTC Aleve 1-2 BID 1-2 weeks, then PRN 2. Start Tylenol 500-1068m per dose TID PRN breakthrough 3. RICE therapy (rest, ice, compression, elevation) for swelling, activity modification 4. Knee x-rays ordered - next week 5. Follow-up 2 - 4 weeks, if still worsening, consider steroid injection and referral to Ortho for further eval, may need MRI if concern remains for meniscus / ligament injury   Orders Placed This Encounter  Procedures   DG Knee Complete 4 Views Left    Standing Status:   Future    Standing Expiration Date:   02/08/2022    Order Specific Question:   Reason for Exam (SYMPTOM  OR DIAGNOSIS REQUIRED)    Answer:   left knee pain arthritis, medial    Order Specific Question:   Preferred imaging location?    Answer:   [facility]    No orders of the defined types were placed in this encounter.     Follow up plan: Return in about 4 weeks (around 12/08/2021), or if symptoms worsen or fail to  improve, for within 4 weeks follow-up knee pain.  Jillian Fritz, [facility] 11/10/2021, 2:50 PM

## 2021-11-13 ENCOUNTER — Ambulatory Visit
Admission: RE | Admit: 2021-11-13 | Discharge: 2021-11-13 | Disposition: A | Discharge status: Home / Self Care | Payer: Medicare Other | Attending: Family Medicine | Admitting: Family Medicine

## 2021-11-13 ENCOUNTER — Other Ambulatory Visit: Payer: Self-pay

## 2021-11-13 ENCOUNTER — Ambulatory Visit
Admission: RE | Admit: 2021-11-13 | Discharge: 2021-11-13 | Disposition: A | Discharge status: Home / Self Care | Payer: Medicare Other | Source: Ambulatory Visit | Attending: Family Medicine | Admitting: Family Medicine

## 2021-11-13 DIAGNOSIS — M170 Bilateral primary osteoarthritis of knee: Secondary | ICD-10-CM

## 2021-11-13 DIAGNOSIS — M25562 Pain in left knee: Secondary | ICD-10-CM | POA: Insufficient documentation

## 2021-11-13 DIAGNOSIS — R600 Localized edema: Secondary | ICD-10-CM | POA: Diagnosis not present

## 2021-11-17 ENCOUNTER — Encounter: Payer: Self-pay | Admitting: Family Medicine

## 2021-11-17 DIAGNOSIS — J019 Acute sinusitis, unspecified: Secondary | ICD-10-CM

## 2021-11-17 MED ORDER — AZITHROMYCIN 250 MG PO TABS: ORAL_TABLET | ORAL | 0 refills | Status: DC

## 2021-12-01 ENCOUNTER — Other Ambulatory Visit: Payer: Self-pay | Admitting: Family Medicine

## 2021-12-01 DIAGNOSIS — I129 Hypertensive chronic kidney disease with stage 1 through stage 4 chronic kidney disease, or unspecified chronic kidney disease: Secondary | ICD-10-CM

## 2021-12-01 NOTE — Telephone Encounter (Signed)
Requested Prescriptions  Pending Prescriptions Disp Refills   losartan (COZAAR) 50 MG tablet [Pharmacy Med Name: LOSARTAN POTASSIUM 50 MG TAB] 90 tablet 2    Sig: TAKE 1 TABLET BY MOUTH EVERY DAY     Cardiovascular:  Angiotensin Receptor Blockers Passed - 12/01/2021  1:31 AM      Passed - Cr in normal range and within 180 days    Creat  Date Value Ref Range Status  08/16/2016 1.13 (H) 0.50 - 0.99 mg/dL Final    Comment:      For patients > or = 69 years of age: The upper reference limit for Creatinine is approximately 13% higher for people identified as African-American.      Creatinine, Ser  Date Value Ref Range Status  08/30/2021 0.85 0.57 - 1.00 mg/dL Final         Passed - K in normal range and within 180 days    Potassium  Date Value Ref Range Status  08/30/2021 4.3 3.5 - 5.2 mmol/L Final  10/29/2013 3.6 3.5 - 5.1 mmol/L Final         Passed - Patient is not pregnant      Passed - Last BP in normal range    BP Readings from Last 1 Encounters:  11/10/21 139/76         Passed - Valid encounter within last 6 months    Recent Outpatient Visits          3 weeks ago Left medial knee pain   [place], DO   2 months ago Annual physical exam   [facility] Olin Hauser, DO   1 year ago Annual physical exam   [facility] Olin Hauser, DO   2 years ago Annual physical exam   [facility] [place], Devonne Doughty, DO   2 years ago Hypothyroidism due to acquired atrophy of thyroid   [place], Devonne Doughty, DO      Future Appointments            In 8 months [facility], [facility]

## 2022-05-19 ENCOUNTER — Other Ambulatory Visit: Payer: Self-pay | Admitting: Family Medicine

## 2022-05-19 DIAGNOSIS — E034 Atrophy of thyroid (acquired): Secondary | ICD-10-CM

## 2022-05-21 ENCOUNTER — Other Ambulatory Visit: Payer: Self-pay | Admitting: Family Medicine

## 2022-05-21 DIAGNOSIS — I129 Hypertensive chronic kidney disease with stage 1 through stage 4 chronic kidney disease, or unspecified chronic kidney disease: Secondary | ICD-10-CM

## 2022-05-21 NOTE — Telephone Encounter (Signed)
Requested Prescriptions  Pending Prescriptions Disp Refills  . levothyroxine (SYNTHROID) 88 MCG tablet [Pharmacy Med Name: Levothyroxine Sodium 88 MCG Oral Tablet] 100 tablet 1    Sig: TAKE 1 TABLET BY MOUTH  DAILY BEFORE BREAKFAST     Endocrinology:  Hypothyroid Agents Passed - 05/19/2022 10:12 AM      Passed - TSH in normal range and within 360 days    TSH  Date Value Ref Range Status  08/30/2021 1.520 0.450 - 4.500 uIU/mL Final         Passed - Valid encounter within last 12 months    Recent Outpatient Visits          6 months ago Left medial knee pain   [place], DO   8 months ago Annual physical exam   [place], DO   1 year ago Annual physical exam   [facility] Olin Hauser, DO   2 years ago Annual physical exam   [facility] [place], Devonne Doughty, DO   3 years ago Hypothyroidism due to acquired atrophy of thyroid   [place], Devonne Doughty, DO      Future Appointments            In 2 months [facility], [facility]

## 2022-05-22 NOTE — Telephone Encounter (Signed)
Refilled 09/08/2021 #90 3 refills - 1 year supply. Requested Prescriptions  Pending Prescriptions Disp Refills  . hydrochlorothiazide (HYDRODIURIL) 12.5 MG tablet [Pharmacy Med Name: hydroCHLOROthiazide 12.5 MG Oral Tablet] 100 tablet 2    Sig: TAKE 1 TABLET BY MOUTH  DAILY     Cardiovascular: Diuretics - Thiazide Failed - 05/21/2022 10:57 AM      Failed - Cr in normal range and within 180 days    Creat  Date Value Ref Range Status  08/16/2016 1.13 (H) 0.50 - 0.99 mg/dL Final    Comment:      For patients > or = 70 years of age: The upper reference limit for Creatinine is approximately 13% higher for people identified as African-American.      Creatinine, Ser  Date Value Ref Range Status  08/30/2021 0.85 0.57 - 1.00 mg/dL Final         Failed - K in normal range and within 180 days    Potassium  Date Value Ref Range Status  08/30/2021 4.3 3.5 - 5.2 mmol/L Final  10/29/2013 3.6 3.5 - 5.1 mmol/L Final         Failed - Na in normal range and within 180 days    Sodium  Date Value Ref Range Status  08/30/2021 141 134 - 144 mmol/L Final  10/29/2013 140 136 - 145 mmol/L Final         Failed - Valid encounter within last 6 months    Recent Outpatient Visits          6 months ago Left medial knee pain   [place], DO   8 months ago Annual physical exam   [place], DO   1 year ago Annual physical exam   [facility] Olin Hauser, DO   2 years ago Annual physical exam   [facility] [place], Devonne Doughty, DO   3 years ago Hypothyroidism due to acquired atrophy of thyroid   [place], DO      Future Appointments            In 2 months [place] BP in normal range    BP Readings from Last 1 Encounters:  11/10/21 139/76

## 2022-06-04 ENCOUNTER — Other Ambulatory Visit: Payer: Self-pay | Admitting: Family Medicine

## 2022-06-04 DIAGNOSIS — E782 Mixed hyperlipidemia: Secondary | ICD-10-CM

## 2022-06-06 NOTE — Telephone Encounter (Signed)
Requested Prescriptions  Pending Prescriptions Disp Refills  . rosuvastatin (CRESTOR) 5 MG tablet [Pharmacy Med Name: Rosuvastatin Calcium 5 MG Oral Tablet] 43 tablet 1    Sig: TAKE 1 TABLET BY MOUTH 3  TIMES WEEKLY     Cardiovascular:  Antilipid - Statins 2 Failed - 06/04/2022 10:39 PM      Failed - Lipid Panel in normal range within the last 12 months    Cholesterol, Total  Date Value Ref Range Status  08/30/2021 179 100 - 199 mg/dL Final   LDL Chol Calc (NIH)  Date Value Ref Range Status  08/30/2021 89 0 - 99 mg/dL Final   HDL  Date Value Ref Range Status  08/30/2021 70 >39 mg/dL Final   Triglycerides  Date Value Ref Range Status  08/30/2021 112 0 - 149 mg/dL Final         Passed - Cr in normal range and within 360 days    Creat  Date Value Ref Range Status  08/16/2016 1.13 (H) 0.50 - 0.99 mg/dL Final    Comment:      For patients > or = 70 years of age: The upper reference limit for Creatinine is approximately 13% higher for people identified as African-American.      Creatinine, Ser  Date Value Ref Range Status  08/30/2021 0.85 0.57 - 1.00 mg/dL Final         Passed - Patient is not pregnant      Passed - Valid encounter within last 12 months    Recent Outpatient Visits          6 months ago Left medial knee pain   [place], DO   9 months ago Annual physical exam   [facility] Olin Hauser, DO   1 year ago Annual physical exam   [facility] Olin Hauser, DO   2 years ago Annual physical exam   [facility] Olin Hauser, DO   3 years ago Hypothyroidism due to acquired atrophy of thyroid   [place], DO      Future Appointments            In 2 months [facility], [facility]

## 2022-07-18 ENCOUNTER — Other Ambulatory Visit: Payer: Self-pay | Admitting: Family Medicine

## 2022-07-18 DIAGNOSIS — E782 Mixed hyperlipidemia: Secondary | ICD-10-CM

## 2022-07-18 DIAGNOSIS — K219 Gastro-esophageal reflux disease without esophagitis: Secondary | ICD-10-CM

## 2022-07-19 NOTE — Telephone Encounter (Signed)
Requested Prescriptions  Pending Prescriptions Disp Refills  . pantoprazole (PROTONIX) 40 MG tablet [Pharmacy Med Name: Pantoprazole Sodium 40 MG Oral Tablet Delayed Release] 100 tablet 0    Sig: TAKE 1 TABLET BY MOUTH  DAILY BEFORE BREAKFAST     Gastroenterology: Proton Pump Inhibitors Passed - 07/18/2022 10:07 PM      Passed - Valid encounter within last 12 months    Recent Outpatient Visits          8 months ago Left medial knee pain   [place], DO   10 months ago Annual physical exam   [place], Devonne Doughty, DO   1 year ago Annual physical exam   [facility] Olin Hauser, DO   2 years ago Annual physical exam   [facility] Olin Hauser, DO   3 years ago Hypothyroidism due to acquired atrophy of thyroid   [place], DO      Future Appointments            In 3 weeks [facility], [place]            . ezetimibe (ZETIA) 10 MG tablet [Pharmacy Med Name: Ezetimibe 10 MG Oral Tablet] 100 tablet 0    Sig: TAKE 1 TABLET BY MOUTH  DAILY     Cardiovascular:  Antilipid - Sterol Transport Inhibitors Failed - 07/18/2022 10:07 PM      Failed - Lipid Panel in normal range within the last 12 months    Cholesterol, Total  Date Value Ref Range Status  08/30/2021 179 100 - 199 mg/dL Final   LDL Chol Calc (NIH)  Date Value Ref Range Status  08/30/2021 89 0 - 99 mg/dL Final   HDL  Date Value Ref Range Status  08/30/2021 70 >39 mg/dL Final   Triglycerides  Date Value Ref Range Status  08/30/2021 112 0 - 149 mg/dL Final         Passed - AST in normal range and within 360 days    AST  Date Value Ref Range Status  08/30/2021 18 0 - 40 IU/L Final         Passed - ALT in normal range and within 360 days    ALT  Date Value Ref Range Status  08/30/2021 16 0 - 32 IU/L Final         Passed - Patient  is not pregnant      Passed - Valid encounter within last 12 months    Recent Outpatient Visits          8 months ago Left medial knee pain   [place], DO   10 months ago Annual physical exam   [facility] Olin Hauser, DO   1 year ago Annual physical exam   [facility] Olin Hauser, DO   2 years ago Annual physical exam   [facility] [place], Devonne Doughty, DO   3 years ago Hypothyroidism due to acquired atrophy of thyroid   [place], Devonne Doughty, DO      Future Appointments            In 3 weeks [facility], [facility]

## 2022-08-03 ENCOUNTER — Other Ambulatory Visit: Payer: Self-pay | Admitting: Family Medicine

## 2022-08-03 DIAGNOSIS — I129 Hypertensive chronic kidney disease with stage 1 through stage 4 chronic kidney disease, or unspecified chronic kidney disease: Secondary | ICD-10-CM

## 2022-08-07 NOTE — Telephone Encounter (Signed)
Patient called, left VM to return the call to the office to scheduled an appt for medication refill request.   

## 2022-08-07 NOTE — Telephone Encounter (Signed)
Requested medication (s) are due for refill today: yes  Requested medication (s) are on the active medication list: yes  Last refill:  09/08/21 #90/3  Future visit scheduled: no  Notes to clinic:  Unable to refill per protocol due to failed labs, no updated results. Called pt, LVMTCB     Requested Prescriptions  Pending Prescriptions Disp Refills   hydrochlorothiazide (HYDRODIURIL) 12.5 MG tablet [Pharmacy Med Name: hydroCHLOROthiazide 12.5 MG Oral Tablet] 80 tablet 3    Sig: TAKE 1 TABLET BY MOUTH  DAILY     Cardiovascular: Diuretics - Thiazide Failed - 08/03/2022 11:23 PM      Failed - Cr in normal range and within 180 days    Creat  Date Value Ref Range Status  08/16/2016 1.13 (H) 0.50 - 0.99 mg/dL Final    Comment:      For patients > or = 70 years of age: The upper reference limit for Creatinine is approximately 13% higher for people identified as African-American.      Creatinine, Ser  Date Value Ref Range Status  08/30/2021 0.85 0.57 - 1.00 mg/dL Final         Failed - K in normal range and within 180 days    Potassium  Date Value Ref Range Status  08/30/2021 4.3 3.5 - 5.2 mmol/L Final  10/29/2013 3.6 3.5 - 5.1 mmol/L Final         Failed - Na in normal range and within 180 days    Sodium  Date Value Ref Range Status  08/30/2021 141 134 - 144 mmol/L Final  10/29/2013 140 136 - 145 mmol/L Final         Failed - Valid encounter within last 6 months    Recent Outpatient Visits           9 months ago Left medial knee pain   [place], DO   11 months ago Annual physical exam   [place], DO   1 year ago Annual physical exam   [facility] Olin Hauser, DO   3 years ago Annual physical exam   [facility] [place], Devonne Doughty, DO   3 years ago Hypothyroidism due to acquired atrophy of thyroid   [place], DO       Future Appointments             In 3 days [place] BP in normal range    BP Readings from Last 1 Encounters:  11/10/21 139/76

## 2022-08-10 ENCOUNTER — Ambulatory Visit (INDEPENDENT_AMBULATORY_CARE_PROVIDER_SITE_OTHER): Payer: Medicare Other

## 2022-08-10 VITALS — BP 140/80 | Ht 65.5 in | Wt 190.0 lb

## 2022-08-10 DIAGNOSIS — Z1231 Encounter for screening mammogram for malignant neoplasm of breast: Secondary | ICD-10-CM

## 2022-08-10 DIAGNOSIS — Z0000 Encounter for general adult medical examination without abnormal findings: Secondary | ICD-10-CM | POA: Diagnosis not present

## 2022-08-10 DIAGNOSIS — Z780 Asymptomatic menopausal state: Secondary | ICD-10-CM

## 2022-08-10 NOTE — Progress Notes (Signed)
Subjective:   Jillian Fritz is a 71 y.o. female who presents for Medicare Annual (Subsequent) preventive examination.  Review of Systems     Cardiac Risk Factors include: advanced age (>41mn, >>46women);dyslipidemia;hypertension     Objective:    Today's Vitals   08/10/22 1000  BP: (!) 140/80  Weight: 190 lb (86.2 kg)  Height: 5' 5.5" (1.664 m)   Body mass index is 31.14 kg/m.     08/10/2022   10:06 AM 08/08/2021   10:23 AM 04/14/2020    7:34 AM 10/14/2018    1:54 PM 10/12/2018   12:52 PM 03/23/2016    8:59 AM  Advanced Directives  Does Patient Have a Medical Advance Directive? No Yes Yes Yes Yes No  Type of ACorporate treasurerof [place]Living will [place]Living will H[place]Living will [place]Living will   Does patient want to make changes to medical advance directive?   No - Patient declined     Copy of [place]ake [place] Chart?  No - copy requested No - copy requested     Would patient like information on creating a medical advance directive? No - Patient declined     No - patient declined information    Current Medications (verified) Outpatient Encounter Medications as of 08/10/2022  Medication Sig   albuterol (VENTOLIN HFA) 108 (90 Base) MCG/ACT inhaler Inhale 2 puffs into the lungs every 4 (four) hours as needed for wheezing or shortness of breath (cough).   aspirin EC 81 MG tablet Take 1 tablet (81 mg total) by mouth daily.   Cholecalciferol (VITAMIN D3) 50 MCG (2000 UT) TABS Take by mouth.   Coenzyme Q10 (COQ10) 100 MG CAPS Take by mouth.   ezetimibe (ZETIA) 10 MG tablet TAKE 1 TABLET BY MOUTH  DAILY   hydrochlorothiazide (HYDRODIURIL) 12.5 MG tablet TAKE 1 TABLET BY MOUTH  DAILY   ibuprofen (ADVIL) 800 MG tablet Take one tablet po tid.   ipratropium (ATROVENT) 0.06 % nasal spray Place 2 sprays into both nostrils 4 (four) times daily. For up to 5-7 days then stop.    levothyroxine (SYNTHROID) 100 MCG tablet    levothyroxine (SYNTHROID) 88 MCG tablet TAKE 1 TABLET BY MOUTH  DAILY BEFORE BREAKFAST   losartan (COZAAR) 25 MG tablet    losartan (COZAAR) 50 MG tablet TAKE 1 TABLET BY MOUTH EVERY DAY   pantoprazole (PROTONIX) 40 MG tablet TAKE 1 TABLET BY MOUTH  DAILY BEFORE BREAKFAST   Probiotic Product (PROBIOTIC DAILY PO) Take by mouth daily.   rosuvastatin (CRESTOR) 5 MG tablet TAKE 1 TABLET BY MOUTH 3  TIMES WEEKLY   Zoster Vaccine Adjuvanted (SHINGRIX) injection    azithromycin (ZITHROMAX Z-PAK) 250 MG tablet Take 2 tabs ('500mg'$  total) on Day 1. Take 1 tab ('250mg'$ ) daily for next 4 days. (Patient not taking: Reported on 08/10/2022)   cephALEXin (KEFLEX) 500 MG capsule Take 1 capsule every 6 hours by oral route for 7 days. (Patient not taking: Reported on 08/10/2022)   clindamycin (CLEOCIN) 150 MG capsule  (Patient not taking: Reported on 08/10/2022)   clindamycin (CLEOCIN) 300 MG capsule Take 1 capsule every 6 hours by oral route for 7 days. (Patient not taking: Reported on 08/10/2022)   doxycycline (VIBRAMYCIN) 100 MG capsule  (Patient not taking: Reported on 08/10/2022)   ondansetron (ZOFRAN-ODT) 4 MG disintegrating tablet Take one tablet po q 12 prn nausea. (Patient not taking: Reported on 08/10/2022)  oxyCODONE (OXY IR/ROXICODONE) 5 MG immediate release tablet Take one tablet po q 4-6 hours prn pain. (Patient not taking: Reported on 08/10/2022)   No facility-administered encounter medications on file as of 08/10/2022.    Allergies (verified) Penicillins, Sulfa antibiotics, Statins, Dill oil, Lisinopril, Peach [prunus persica], Strawberry (diagnostic), and Propofol   History: Past Medical History:  Diagnosis Date   Arthritis    knuckles   Asthma    childhood - exercise induced   Dental bridge present    flexible, removable, upper, fits snuggly   GERD (gastroesophageal reflux disease)    Heart murmur    states she has 2 murmurs/no issues except when pregnant    Hypertension    controlled on meds   Hypothyroidism    Migraines    2 or 3 years ago   Motion sickness    cars, planes   Obesity    Shortness of breath dyspnea    Wears contact lenses    Past Surgical History:  Procedure Laterality Date   CERVICAL CONE BIOPSY     COLONOSCOPY WITH PROPOFOL N/A 04/14/2020   Procedure: COLONOSCOPY WITH BIOPSY;  Surgeon: [name], MD;  Location: [facility];  Service: Endoscopy;  Laterality: N/A;  priority 3   ESOPHAGOGASTRODUODENOSCOPY (EGD) WITH PROPOFOL N/A 03/23/2016   Procedure: ESOPHAGOGASTRODUODENOSCOPY (EGD) WITH esophageal dilation. ;  Surgeon: [name], MD;  Location: [facility];  Service: Endoscopy;  Laterality: N/A;   POLYPECTOMY N/A 04/14/2020   Procedure: POLYPECTOMY;  Surgeon: [name], MD;  Location: [facility];  Service: Endoscopy;  Laterality: N/A;  2 Clips placed at [facility]yp site   THROAT SURGERY  04/2014   Family History  Problem Relation Age of Onset   Alzheimer's disease Father    Hypertension Mother    Breast cancer Paternal Aunt        3 pat aunts   Thyroid disease Sister    Social History   Socioeconomic History   Marital status: Single    Spouse name: Not on file   Number of children: Not on file   Years of education: Not on file   Highest education level: Not on file  Occupational History   Occupation: [employer]  Tobacco Use   Smoking status: Never   Smokeless tobacco: Never  Vaping Use   Vaping Use: Never used  Substance and Sexual Activity   Alcohol use: No    Alcohol/week: 0.0 standard drinks of alcohol   Drug use: No   Sexual activity: Not Currently  Other Topics Concern   Not on file  Social History Narrative   Not on file   Social Determinants of Health   Financial Resource Strain: Low Risk  (08/10/2022)   Overall Financial Resource Strain (CARDIA)    Difficulty of Paying Living Expenses: Not hard at all  Food Insecurity: No Food  Insecurity (08/10/2022)   Hunger Vital Sign    Worried About Running Out of Food in the Last Year: Never true    [place] in the Last Year: Never true  Transportation Needs: No Transportation Needs (08/10/2022)   PRAPARE - Hydrologist (Medical): No    Lack of Transportation (Non-Medical): No  Physical Activity: Insufficiently Active (08/10/2022)   Exercise Vital Sign    Days of Exercise per Week: 3 days    Minutes of Exercise per Session: 30 min  Stress: No Stress Concern Present (08/10/2022)   [employer]  [employer] - Occupational Stress Questionnaire    Feeling of Stress : Not at all  Social Connections: Moderately Isolated (08/10/2022)   Social Connection and Isolation Panel [NHANES]    Frequency of Communication with Friends and Family: More than three times a week    Frequency of Social Gatherings with Friends and Family: More than three times a week    Attends Religious Services: More than 4 times per year    Active Member of [employer] or Organizations: No    Attends Archivist Meetings: Never    Marital Status: Widowed    Tobacco Counseling Counseling given: Not Answered   Clinical Intake:  Pre-visit preparation completed: Yes  Pain : No/denies pain     Nutritional Risks: None Diabetes: No  How often do you need to have someone help you when you read instructions, pamphlets, or other written materials from your doctor or pharmacy?: 1 - Never  Diabetic?no  Interpreter Needed?: No  Information entered by :: Kirke Shaggy, LPN   Activities of Daily Living    08/10/2022   10:07 AM  In your present state of health, do you have any difficulty performing the following activities:  Hearing? 0  Vision? 0  Difficulty concentrating or making decisions? 0  Walking or climbing stairs? 0  Dressing or bathing? 0  Doing errands, shopping? 0  Preparing Food and eating ? N  Using the Toilet? N  In the past six months,  have you accidently leaked urine? N  Do you have problems with loss of bowel control? N  Managing your Medications? N  Managing your Finances? N  Housekeeping or managing your Housekeeping? N    Patient Care Team: Olin Hauser, DO as PCP - General (Family Medicine)  Indicate any recent Medical Services you may have received from other than [facility] providers in the past year (date may be approximate).     Assessment:   This is a routine wellness examination for Malin.  Hearing/Vision screen Hearing Screening - Comments:: No aids Vision Screening - Comments:: Wears contacts- Dr.Woodard  Dietary issues and exercise activities discussed: Current Exercise Habits: Home exercise routine, Type of exercise: walking, Time (Minutes): 30, Frequency (Times/Week): 3, Weekly Exercise (Minutes/Week): 90, Intensity: Mild   Goals Addressed             This Visit's Progress    DIET - EAT MORE FRUITS AND VEGETABLES         Depression Screen    08/10/2022   10:04 AM 11/10/2021    2:19 PM 08/08/2021   10:24 AM 08/11/2020   10:04 AM 08/07/2019    8:35 AM 01/30/2019    8:17 AM 07/30/2018   10:46 AM  PHQ 2/9 Scores  PHQ - 2 Score 0 0 0 0 0 0 0  PHQ- 9 Score 0 0         Fall Risk    08/10/2022   10:07 AM 11/10/2021    2:19 PM 08/08/2021   10:24 AM 08/11/2020    9:10 AM 08/07/2019    8:35 AM  Fall Risk   Falls in the past year? 0 0 0 0 0  Number falls in past yr: 0 0  0   Injury with Fall? 0 0  0   Risk for fall due to : No Fall Risks  Medication side effect    Follow up Falls evaluation completed Falls evaluation completed Falls evaluation completed;Education provided;Falls prevention discussed Falls evaluation completed Falls evaluation completed  FALL RISK PREVENTION PERTAINING TO THE HOME:  Any stairs in or around the home? Yes  If so, are there any without handrails? No  Home free of loose throw rugs in walkways, pet beds, electrical cords, etc? Yes  Adequate lighting in your  home to reduce risk of falls? Yes   ASSISTIVE DEVICES UTILIZED TO PREVENT FALLS:  Life alert? No  Use of a cane, walker or w/c? No  Grab bars in the bathroom? Yes  Shower chair or bench in shower? Yes  Elevated toilet seat or a handicapped toilet? No   TIMED UP AND GO:  Was the test performed? Yes .  Length of time to ambulate 10 feet: 4 sec.   Gait steady and fast without use of assistive device  Cognitive Function:        08/10/2022   10:13 AM 08/08/2021   10:25 AM  6CIT Screen  What Year? 0 points 0 points  What month? 0 points 0 points  What time? 0 points 0 points  Count back from 20 0 points 0 points  Months in reverse 0 points 0 points  Repeat phrase 0 points 2 points  Total Score 0 points 2 points    Immunizations Immunization History  Administered Date(s) Administered   Fluad Quad(high Dose 65+) 08/07/2019, 08/11/2020, 09/08/2021   Influenza-Unspecified 08/13/2018   PFIZER(Purple Top)SARS-COV-2 Vaccination 02/03/2020, 02/24/2020, 11/10/2020   Pneumococcal Conjugate-13 04/26/2017   Pneumococcal Polysaccharide-23 07/30/2018   Zoster Recombinat (Shingrix) 08/30/2017, 12/20/2017    TDAP status: Due, Education has been provided regarding the importance of this vaccine. Advised may receive this vaccine at local pharmacy or Health Dept. Aware to provide a copy of the vaccination record if obtained from local pharmacy or Health Dept. Verbalized acceptance and understanding.  Flu Vaccine status: Up to date  Pneumococcal vaccine status: Up to date  Covid-19 vaccine status: Completed vaccines  Qualifies for Shingles Vaccine? Yes   Zostavax completed No   Shingrix Completed?: Yes  Screening Tests Health Maintenance  Topic Date Due   TETANUS/TDAP  12/04/2019   COVID-19 Vaccine (4 - Pfizer series) 01/05/2021   INFLUENZA VACCINE  07/03/2022   MAMMOGRAM  09/13/2022   COLONOSCOPY (Pts 45-61yr Insurance coverage will need to be confirmed)  04/16/2023   Pneumonia  Vaccine 70 Years old  Completed   DEXA SCAN  Completed   Hepatitis C Screening  Completed   Zoster Vaccines- Shingrix  Completed   HPV VACCINES  Aged Out   Fecal DNA (Cologuard)  Discontinued    Health Maintenance  Health Maintenance Due  Topic Date Due   TETANUS/TDAP  12/04/2019   COVID-19 Vaccine (4 - Pfizer series) 01/05/2021   INFLUENZA VACCINE  07/03/2022    Colorectal cancer screening: Type of screening: Colonoscopy. Completed 04/15/20. Repeat every 3 years  Mammogram status: Completed 09/29/21. Repeat every year  Bone Density status: Completed 10/15/17. Results reflect: Bone density results: OSTEOPENIA. Repeat every 5 years.  Lung Cancer Screening: (Low Dose CT Chest recommended if Age 276-80years, 30 pack-year currently smoking OR have quit w/in 15years.) does not qualify.   Additional Screening:  Hepatitis C Screening: does qualify; Completed 02/13/16  Vision Screening: Recommended annual ophthalmology exams for early detection of glaucoma and other disorders of the eye. Is the patient up to date with their annual eye exam?  Yes  Who is the provider or what is the name of the office in which the patient attends annual eye exams? Dr.Woodard If pt is not established with  a provider, would they like to be referred to a provider to establish care? No .   Dental Screening: Recommended annual dental exams for proper oral hygiene  Community Resource Referral / Chronic Care Management: CRR required this visit?  No   CCM required this visit?  No      Plan:     I have personally reviewed and noted the following in the patient's chart:   Medical and social history Use of alcohol, tobacco or illicit drugs  Current medications and supplements including opioid prescriptions. Patient is not currently taking opioid prescriptions. Functional ability and status Nutritional status Physical activity Advanced directives List of other physicians Hospitalizations, surgeries,  and ER visits in previous 12 months Vitals Screenings to include cognitive, depression, and falls Referrals and appointments  In addition, I have reviewed and discussed with patient certain preventive protocols, quality metrics, and best practice recommendations. A written personalized care plan for preventive services as well as general preventive health recommendations were provided to patient.     Dionisio David, LPN   03/09/8031   Nurse Notes: none

## 2022-08-10 NOTE — Patient Instructions (Signed)
Jillian Fritz , Thank you for taking time to come for your Medicare Wellness Visit. I appreciate your ongoing commitment to your health goals. Please review the following plan we discussed and let me know if I can assist you in the future.   Screening recommendations/referrals: Colonoscopy: 04/15/20 Mammogram: 09/29/21, referral sent Bone Density: 10/15/17, referral sent Recommended yearly ophthalmology/optometry visit for glaucoma screening and checkup Recommended yearly dental visit for hygiene and checkup  Vaccinations: Influenza vaccine: 09/08/21 Pneumococcal vaccine: 07/30/18 Tdap vaccine: 12/03/09, due if have injury Shingles vaccine: Shingrix 08/30/17, 12/20/17   Covid-19:02/03/20, 02/24/20, 11/10/20  Advanced directives: no  Conditions/risks identified: none  Next appointment: Follow up in one year for your annual wellness visit 08/16/23 @ 10:15 am in person   Preventive Care 86 Years and Older, Female Preventive care refers to lifestyle choices and visits with your health care provider that can promote health and wellness. What does preventive care include? A yearly physical exam. This is also called an annual well check. Dental exams once or twice a year. Routine eye exams. Ask your health care provider how often you should have your eyes checked. Personal lifestyle choices, including: Daily care of your teeth and gums. Regular physical activity. Eating a healthy diet. Avoiding tobacco and drug use. Limiting alcohol use. Practicing safe sex. Taking low-dose aspirin every day. Taking vitamin and mineral supplements as recommended by your health care provider. What happens during an annual well check? The services and screenings done by your health care provider during your annual well check will depend on your age, overall health, lifestyle risk factors, and family history of disease. Counseling  Your health care provider may ask you questions about your: Alcohol use. Tobacco  use. Drug use. Emotional well-being. Home and relationship well-being. Sexual activity. Eating habits. History of falls. Memory and ability to understand (cognition). Work and work Statistician. Reproductive health. Screening  You may have the following tests or measurements: Height, weight, and BMI. Blood pressure. Lipid and cholesterol levels. These may be checked every 5 years, or more frequently if you are over 44 years old. Skin check. Lung cancer screening. You may have this screening every year starting at age 39 if you have a 30-pack-year history of smoking and currently smoke or have quit within the past 15 years. Fecal occult blood test (FOBT) of the stool. You may have this test every year starting at age 50. Flexible sigmoidoscopy or colonoscopy. You may have a sigmoidoscopy every 5 years or a colonoscopy every 10 years starting at age 14. Hepatitis C blood test. Hepatitis B blood test. Sexually transmitted disease (STD) testing. Diabetes screening. This is done by checking your blood sugar (glucose) after you have not eaten for a while (fasting). You may have this done every 1-3 years. Bone density scan. This is done to screen for osteoporosis. You may have this done starting at age 69. Mammogram. This may be done every 1-2 years. Talk to your health care provider about how often you should have regular mammograms. Talk with your health care provider about your test results, treatment options, and if necessary, the need for more tests. Vaccines  Your health care provider may recommend certain vaccines, such as: Influenza vaccine. This is recommended every year. Tetanus, diphtheria, and acellular pertussis (Tdap, Td) vaccine. You may need a Td booster every 10 years. Zoster vaccine. You may need this after age 76. Pneumococcal 13-valent conjugate (PCV13) vaccine. One dose is recommended after age 68. Pneumococcal polysaccharide (PPSV23) vaccine. One dose  is recommended  after age 64. Talk to your health care provider about which screenings and vaccines you need and how often you need them. This information is not intended to replace advice given to you by your health care provider. Make sure you discuss any questions you have with your health care provider. Document Released: 12/16/2015 Document Revised: 08/08/2016 Document Reviewed: 09/20/2015 Elsevier Interactive Patient Education  2017 [place] Prevention in the Home Falls can cause injuries. They can happen to people of all ages. There are many things you can do to make your home safe and to help prevent falls. What can I do on the outside of my home? Regularly fix the edges of walkways and driveways and fix any cracks. Remove anything that might make you trip as you walk through a door, such as a raised step or threshold. Trim any bushes or trees on the path to your home. Use bright outdoor lighting. Clear any walking paths of anything that might make someone trip, such as rocks or tools. Regularly check to see if handrails are loose or broken. Make sure that both sides of any steps have handrails. Any raised decks and porches should have guardrails on the edges. Have any leaves, snow, or ice cleared regularly. Use sand or salt on walking paths during winter. Clean up any spills in your garage right away. This includes oil or grease spills. What can I do in the bathroom? Use night lights. Install grab bars by the toilet and in the tub and shower. Do not use towel bars as grab bars. Use non-skid mats or decals in the tub or shower. If you need to sit down in the shower, use a plastic, non-slip stool. Keep the floor dry. Clean up any water that spills on the floor as soon as it happens. Remove soap buildup in the tub or shower regularly. Attach bath mats securely with double-sided non-slip rug tape. Do not have throw rugs and other things on the floor that can make you trip. What can I do  in the bedroom? Use night lights. Make sure that you have a light by your bed that is easy to reach. Do not use any sheets or blankets that are too big for your bed. They should not hang down onto the floor. Have a firm chair that has side arms. You can use this for support while you get dressed. Do not have throw rugs and other things on the floor that can make you trip. What can I do in the kitchen? Clean up any spills right away. Avoid walking on wet floors. Keep items that you use a lot in easy-to-reach places. If you need to reach something above you, use a strong step stool that has a grab bar. Keep electrical cords out of the way. Do not use floor polish or wax that makes floors slippery. If you must use wax, use non-skid floor wax. Do not have throw rugs and other things on the floor that can make you trip. What can I do with my stairs? Do not leave any items on the stairs. Make sure that there are handrails on both sides of the stairs and use them. Fix handrails that are broken or loose. Make sure that handrails are as long as the stairways. Check any carpeting to make sure that it is firmly attached to the stairs. Fix any carpet that is loose or worn. Avoid having throw rugs at the top or bottom of the stairs.  If you do have throw rugs, attach them to the floor with carpet tape. Make sure that you have a light switch at the top of the stairs and the bottom of the stairs. If you do not have them, ask someone to add them for you. What else can I do to help prevent falls? Wear shoes that: Do not have high heels. Have rubber bottoms. Are comfortable and fit you well. Are closed at the toe. Do not wear sandals. If you use a stepladder: Make sure that it is fully opened. Do not climb a closed stepladder. Make sure that both sides of the stepladder are locked into place. Ask someone to hold it for you, if possible. Clearly mark and make sure that you can see: Any grab bars or  handrails. First and last steps. Where the edge of each step is. Use tools that help you move around (mobility aids) if they are needed. These include: Canes. Walkers. Scooters. Crutches. Turn on the lights when you go into a dark area. Replace any light bulbs as soon as they burn out. Set up your furniture so you have a clear path. Avoid moving your furniture around. If any of your floors are uneven, fix them. If there are any pets around you, be aware of where they are. Review your medicines with your doctor. Some medicines can make you feel dizzy. This can increase your chance of falling. Ask your doctor what other things that you can do to help prevent falls. This information is not intended to replace advice given to you by your health care provider. Make sure you discuss any questions you have with your health care provider. Document Released: 09/15/2009 Document Revised: 04/26/2016 Document Reviewed: 12/24/2014 Elsevier Interactive Patient Education  2017 [employer].

## 2022-08-14 ENCOUNTER — Ambulatory Visit: Payer: Medicare Other

## 2022-09-05 ENCOUNTER — Other Ambulatory Visit: Payer: Self-pay

## 2022-09-05 DIAGNOSIS — E559 Vitamin D deficiency, unspecified: Secondary | ICD-10-CM

## 2022-09-05 DIAGNOSIS — Z0000 Encounter for general adult medical examination without abnormal findings: Secondary | ICD-10-CM

## 2022-09-05 DIAGNOSIS — Z1231 Encounter for screening mammogram for malignant neoplasm of breast: Secondary | ICD-10-CM

## 2022-09-05 DIAGNOSIS — E782 Mixed hyperlipidemia: Secondary | ICD-10-CM

## 2022-09-05 DIAGNOSIS — E034 Atrophy of thyroid (acquired): Secondary | ICD-10-CM

## 2022-09-05 DIAGNOSIS — R7309 Other abnormal glucose: Secondary | ICD-10-CM

## 2022-09-06 ENCOUNTER — Other Ambulatory Visit: Payer: Medicare Other

## 2022-09-06 DIAGNOSIS — R7309 Other abnormal glucose: Secondary | ICD-10-CM | POA: Diagnosis not present

## 2022-09-06 DIAGNOSIS — Z0000 Encounter for general adult medical examination without abnormal findings: Secondary | ICD-10-CM | POA: Diagnosis not present

## 2022-09-06 DIAGNOSIS — E034 Atrophy of thyroid (acquired): Secondary | ICD-10-CM | POA: Diagnosis not present

## 2022-09-06 DIAGNOSIS — E782 Mixed hyperlipidemia: Secondary | ICD-10-CM | POA: Diagnosis not present

## 2022-09-06 DIAGNOSIS — E559 Vitamin D deficiency, unspecified: Secondary | ICD-10-CM | POA: Diagnosis not present

## 2022-09-07 ENCOUNTER — Other Ambulatory Visit: Payer: Self-pay | Admitting: Family Medicine

## 2022-09-07 DIAGNOSIS — I129 Hypertensive chronic kidney disease with stage 1 through stage 4 chronic kidney disease, or unspecified chronic kidney disease: Secondary | ICD-10-CM

## 2022-09-07 LAB — CBC WITH DIFFERENTIAL/PLATELET
Absolute Monocytes: 647 cells/uL (ref 200–950)
Basophils Absolute: 79 cells/uL (ref 0–200)
Basophils Relative: 1.3 %
Eosinophils Absolute: 360 cells/uL (ref 15–500)
Eosinophils Relative: 5.9 %
HCT: 38.6 % (ref 35.0–45.0)
Hemoglobin: 12.7 g/dL (ref 11.7–15.5)
Lymphs Abs: 2117 cells/uL (ref 850–3900)
MCH: 29.8 pg (ref 27.0–33.0)
MCHC: 32.9 g/dL (ref 32.0–36.0)
MCV: 90.6 fL (ref 80.0–100.0)
MPV: 12.1 fL (ref 7.5–12.5)
Monocytes Relative: 10.6 %
Neutro Abs: 2898 cells/uL (ref 1500–7800)
Neutrophils Relative %: 47.5 %
Platelets: 233 10*3/uL (ref 140–400)
RBC: 4.26 10*6/uL (ref 3.80–5.10)
RDW: 12.9 % (ref 11.0–15.0)
Total Lymphocyte: 34.7 %
WBC: 6.1 10*3/uL (ref 3.8–10.8)

## 2022-09-07 LAB — HEMOGLOBIN A1C
Hgb A1c MFr Bld: 5.5 % of total Hgb (ref <5.7)
Mean Plasma Glucose: 111 mg/dL
eAG (mmol/L): 6.2 mmol/L

## 2022-09-07 LAB — COMPREHENSIVE METABOLIC PANEL
AG Ratio: 1.9 (calc) (ref 1.0–2.5)
ALT: 18 U/L (ref 6–29)
AST: 21 U/L (ref 10–35)
Albumin: 4.3 g/dL (ref 3.6–5.1)
Alkaline phosphatase (APISO): 84 U/L (ref 37–153)
BUN: 15 mg/dL (ref 7–25)
CO2: 28 mmol/L (ref 20–32)
Calcium: 9.6 mg/dL (ref 8.6–10.4)
Chloride: 100 mmol/L (ref 98–110)
Creat: 0.99 mg/dL (ref 0.60–1.00)
Globulin: 2.3 g/dL (calc) (ref 1.9–3.7)
Glucose, Bld: 86 mg/dL (ref 65–99)
Potassium: 4 mmol/L (ref 3.5–5.3)
Sodium: 139 mmol/L (ref 135–146)
Total Bilirubin: 0.9 mg/dL (ref 0.2–1.2)
Total Protein: 6.6 g/dL (ref 6.1–8.1)

## 2022-09-07 LAB — T4, FREE: Free T4: 1.5 ng/dL (ref 0.8–1.8)

## 2022-09-07 LAB — LIPID PANEL
Cholesterol: 201 mg/dL — ABNORMAL HIGH (ref <200)
HDL: 71 mg/dL (ref >=50)
LDL Cholesterol (Calc): 110 mg/dL (calc) — ABNORMAL HIGH
Non-HDL Cholesterol (Calc): 130 mg/dL (calc) — ABNORMAL HIGH (ref <130)
Total CHOL/HDL Ratio: 2.8 (calc) (ref <5.0)
Triglycerides: 94 mg/dL (ref <150)

## 2022-09-07 LAB — TSH: TSH: 0.8 mIU/L (ref 0.40–4.50)

## 2022-09-07 LAB — VITAMIN D 25 HYDROXY (VIT D DEFICIENCY, FRACTURES): Vit D, 25-Hydroxy: 65 ng/mL (ref 30–100)

## 2022-09-10 NOTE — Telephone Encounter (Signed)
Please contact patient to clarify what dose of Losartan she is on  There is a discrepancy on her chart.  Last I saw her in 2022 we recommended continue Losartan '50mg'$  daily.  Now it looks like chart has both '25mg'$  and '50mg'$  listed.  Can you call her to clarify which one I need to order?  [name], DO [facility] 09/10/2022, 5:33 PM

## 2022-09-10 NOTE — Telephone Encounter (Signed)
Requested medications are due for refill today.  yes  Requested medications are on the active medications list.  yes  Last refill. 12/01/2021 #90 2 rf  Future visit scheduled.   yes  Notes to clinic.  PT is requesting 1 year supply. Pt would like order to be in multiple of 100 for insurance.    Requested Prescriptions  Pending Prescriptions Disp Refills   losartan (COZAAR) 50 MG tablet [Pharmacy Med Name: Losartan Potassium 50 MG Oral Tablet] 100 tablet 2    Sig: TAKE 1 TABLET BY MOUTH  DAILY     Cardiovascular:  Angiotensin Receptor Blockers Failed - 09/07/2022 10:39 PM      Failed - Last BP in normal range    BP Readings from Last 1 Encounters:  08/10/22 (!) 140/80         Passed - Cr in normal range and within 180 days    Creat  Date Value Ref Range Status  09/06/2022 0.99 0.60 - 1.00 mg/dL Final         Passed - K in normal range and within 180 days    Potassium  Date Value Ref Range Status  09/06/2022 4.0 3.5 - 5.3 mmol/L Final  10/29/2013 3.6 3.5 - 5.1 mmol/L Final         Passed - Patient is not pregnant      Passed - Valid encounter within last 6 months    Recent Outpatient Visits           10 months ago Left medial knee pain   [facility] Olin Hauser, DO   1 year ago Annual physical exam   [facility] Olin Hauser, DO   2 years ago Annual physical exam   [facility] Olin Hauser, DO   3 years ago Annual physical exam   [facility], Devonne Doughty, DO   3 years ago Hypothyroidism due to acquired atrophy of thyroid   [place], Devonne Doughty, DO       Future Appointments             In 3 days [place], Devonne Doughty, [facility], [facility]

## 2022-09-13 ENCOUNTER — Ambulatory Visit (INDEPENDENT_AMBULATORY_CARE_PROVIDER_SITE_OTHER): Payer: Medicare Other | Admitting: Family Medicine

## 2022-09-13 ENCOUNTER — Other Ambulatory Visit: Payer: Self-pay | Admitting: Family Medicine

## 2022-09-13 ENCOUNTER — Encounter: Payer: Self-pay | Admitting: Family Medicine

## 2022-09-13 VITALS — BP 126/84 | HR 66 | Ht 65.5 in | Wt 192.4 lb

## 2022-09-13 DIAGNOSIS — E559 Vitamin D deficiency, unspecified: Secondary | ICD-10-CM

## 2022-09-13 DIAGNOSIS — N182 Chronic kidney disease, stage 2 (mild): Secondary | ICD-10-CM | POA: Diagnosis not present

## 2022-09-13 DIAGNOSIS — I129 Hypertensive chronic kidney disease with stage 1 through stage 4 chronic kidney disease, or unspecified chronic kidney disease: Secondary | ICD-10-CM | POA: Diagnosis not present

## 2022-09-13 DIAGNOSIS — E669 Obesity, unspecified: Secondary | ICD-10-CM

## 2022-09-13 DIAGNOSIS — E782 Mixed hyperlipidemia: Secondary | ICD-10-CM

## 2022-09-13 DIAGNOSIS — Z0000 Encounter for general adult medical examination without abnormal findings: Secondary | ICD-10-CM | POA: Diagnosis not present

## 2022-09-13 DIAGNOSIS — Z23 Encounter for immunization: Secondary | ICD-10-CM | POA: Diagnosis not present

## 2022-09-13 DIAGNOSIS — H9193 Unspecified hearing loss, bilateral: Secondary | ICD-10-CM | POA: Diagnosis not present

## 2022-09-13 DIAGNOSIS — R7309 Other abnormal glucose: Secondary | ICD-10-CM

## 2022-09-13 DIAGNOSIS — E034 Atrophy of thyroid (acquired): Secondary | ICD-10-CM | POA: Diagnosis not present

## 2022-09-13 DIAGNOSIS — E66811 Obesity, class 1: Secondary | ICD-10-CM

## 2022-09-13 NOTE — Patient Instructions (Addendum)
Thank you for coming to the office today.  [place], [place], [address] [place], [place] [address]  [place], [address] [place], [place] [address]  Phone: [phone]  -----  Labs are excellent. No change to medications BP improved on repeat If need additional refills, let me know  DUE for FASTING BLOOD WORK (no food or drink after midnight before the lab appointment, only water or coffee without cream/sugar on the morning of)  SCHEDULE "Lab Only" visit in the morning at the clinic for lab draw in 1 YEAR  - Make sure Lab Only appointment is at about 1 week before your next appointment, so that results will be available  For Lab Results, once available within 2-3 days of blood draw, you can can log in to MyChart online to view your results and a brief explanation. Also, we can discuss results at next follow-up visit.    Please schedule a Follow-up Appointment to: Return in about 1 year (around 09/14/2023) for 1 year fasting lab only then 1 week later Annual Physical.  If you have any other questions or concerns, please feel free to call the office or send a message through [place]. You may also schedule an earlier appointment if necessary.  Additionally, you may be receiving a survey about your experience at our office within a few days to 1 week by e-mail or mail. We value your feedback.  [name], DO [place]

## 2022-09-13 NOTE — Progress Notes (Signed)
Subjective:    Patient ID: Jillian Fritz, female    DOB: 05-10-1951, 71 y.o.   MRN: [id number]  BRYLA Jillian Fritz is a 71 y.o. female presenting on 09/13/2022 for Annual Exam   HPI  Here for Annual Exam and Lab Review  CHRONIC HTN with CKD-II Creatinine improved - Today patient reports still overall doing well with home BP readings controlled Current Meds - Losartan '50mg'$  daily, HCTZ 12.'5mg'$  daily Tolerating well, w/o complaints. - Still improved edema Not taking NSAIDs   GERD Previously controlled. had esophageal dilatation in past   History of Heart Murmur   Elevated A1c Improved A1c to 5.5 now Meds: not on meds Currently on ARB Limited regular exercise, plans to improve this in future when retire.   HYPERLIPIDEMIA: prior elevated LDL and total cholesterol, previously improved on meds Significant improvement now with improved diet lifestyle, wt loss and GoLow, walking more - taking Zetia 10 and Rosuva '5mg'$  intermittent - last lab showed elevated LDL still    Hypothyroidism: Last labs 09/2022 TSH and Free T4 normal Continues on Levothyroxine 57mg daily - No significant symptoms   Osteopenia, spine / Vitamin D Deficiency Vitamin D up to 65 On supplement  Health Maintenance:  Flu Shot today Future Colonoscopy 2024, (repeat after 3 years from 2021) Dexa Mammo scheduled 10/16     09/13/2022   10:11 AM 08/10/2022   10:04 AM 11/10/2021    2:19 PM  Depression screen PHQ 2/9  Decreased Interest 0 0 0  Down, Depressed, Hopeless 0 0 0  PHQ - 2 Score 0 0 0  Altered sleeping 0 0 0  Tired, decreased energy 0 0 0  Change in appetite 0 0 0  Feeling bad or failure about yourself  0 0 0  Trouble concentrating 0 0 0  Moving slowly or fidgety/restless 0 0 0  Suicidal thoughts 0 0 0  PHQ-9 Score 0 0 0  Difficult doing work/chores Not difficult at all Not difficult at all Not difficult at all    Past Medical History:  Diagnosis Date   Arthritis    knuckles    Asthma    childhood - exercise induced   Dental bridge present    flexible, removable, upper, fits snuggly   GERD (gastroesophageal reflux disease)    Heart murmur    states she has 2 murmurs/no issues except when pregnant   Hypertension    controlled on meds   Hypothyroidism    Migraines    2 or 3 years ago   Motion sickness    cars, planes   Obesity    Shortness of breath dyspnea    Wears contact lenses    Past Surgical History:  Procedure Laterality Date   CERVICAL CONE BIOPSY     COLONOSCOPY WITH PROPOFOL N/A 04/14/2020   Procedure: COLONOSCOPY WITH BIOPSY;  Surgeon: WLucilla Lame MD;  [facility]  Service: Endoscopy;  Laterality: N/A;  priority 3   ESOPHAGOGASTRODUODENOSCOPY (EGD) WITH PROPOFOL N/A 03/23/2016   Procedure: ESOPHAGOGASTRODUODENOSCOPY (EGD) WITH esophageal dilation. ;  Surgeon: DLucilla Lame MD;  [facility]  Service: Endoscopy;  Laterality: N/A;   POLYPECTOMY N/A 04/14/2020   Procedure: POLYPECTOMY;  Surgeon: WLucilla Lame MD;  [facility]  Service: Endoscopy;  Laterality: N/A;  2 Clips placed at [facility] site   THROAT SURGERY  04/2014   Social History   Socioeconomic History   Marital status: Single    Spouse name: Not  on file   Number of children: Not on file   Years of education: Not on file   Highest education level: Not on file  Occupational History   Occupation: [employer]  Tobacco Use   Smoking status: Never   Smokeless tobacco: Never  Vaping Use   Vaping Use: Never used  Substance and Sexual Activity   Alcohol use: No    Alcohol/week: 0.0 standard drinks of alcohol   Drug use: No   Sexual activity: Not Currently  Other Topics Concern   Not on file  Social History Narrative   Not on file   Social Determinants of Health   Financial Resource Strain: Low Risk  (08/10/2022)   Overall Financial Resource Strain (CARDIA)    Difficulty of Paying Living Expenses:  Not hard at all  Food Insecurity: No Food Insecurity (08/10/2022)   Hunger Vital Sign    Worried About Running Out of Food in the Last Year: Never true    [place] in the Last Year: Never true  Transportation Needs: No Transportation Needs (08/10/2022)   PRAPARE - Hydrologist (Medical): No    Lack of Transportation (Non-Medical): No  Physical Activity: Insufficiently Active (08/10/2022)   Exercise Vital Sign    Days of Exercise per Week: 3 days    Minutes of Exercise per Session: 30 min  Stress: No Stress Concern Present (08/10/2022)   [place]    Feeling of Stress : Not at all  Social Connections: Moderately Isolated (08/10/2022)   Social Connection and Isolation Panel [NHANES]    Frequency of Communication with Friends and Family: More than three times a week    Frequency of Social Gatherings with Friends and Family: More than three times a week    Attends Religious Services: More than 4 times per year    Active Member of [employer] or Organizations: No    Attends Archivist Meetings: Never    Marital Status: Widowed  Intimate Partner Violence: Not At Risk (08/10/2022)   Humiliation, Afraid, Rape, and Kick questionnaire    Fear of Current or Ex-Partner: No    Emotionally Abused: No    Physically Abused: No    Sexually Abused: No   Family History  Problem Relation Age of Onset   Alzheimer's disease Father    Hypertension Mother    Breast cancer Paternal Aunt        3 pat aunts   Thyroid disease Sister    Current Outpatient Medications on File Prior to Visit  Medication Sig   albuterol (VENTOLIN HFA) 108 (90 Base) MCG/ACT inhaler Inhale 2 puffs into the lungs every 4 (four) hours as needed for wheezing or shortness of breath (cough).   aspirin EC 81 MG tablet Take 1 tablet (81 mg total) by mouth daily.   Cholecalciferol (VITAMIN D3) 50 MCG (2000 UT) TABS Take by mouth.    Coenzyme Q10 (COQ10) 100 MG CAPS Take by mouth.   ezetimibe (ZETIA) 10 MG tablet TAKE 1 TABLET BY MOUTH  DAILY   hydrochlorothiazide (HYDRODIURIL) 12.5 MG tablet TAKE 1 TABLET BY MOUTH  DAILY   ibuprofen (ADVIL) 800 MG tablet Take one tablet po tid.   ipratropium (ATROVENT) 0.06 % nasal spray Place 2 sprays into both nostrils 4 (four) times daily. For up to 5-7 days then stop.   levothyroxine (SYNTHROID) 88 MCG tablet TAKE 1 TABLET BY MOUTH  DAILY BEFORE BREAKFAST  losartan (COZAAR) 50 MG tablet TAKE 1 TABLET BY MOUTH  DAILY   pantoprazole (PROTONIX) 40 MG tablet TAKE 1 TABLET BY MOUTH  DAILY BEFORE BREAKFAST   Probiotic Product (PROBIOTIC DAILY PO) Take by mouth daily.   rosuvastatin (CRESTOR) 5 MG tablet TAKE 1 TABLET BY MOUTH 3  TIMES WEEKLY   Zoster Vaccine Adjuvanted [facility]) injection    No current facility-administered medications on file prior to visit.    Review of Systems  Constitutional:  Negative for activity change, appetite change, chills, diaphoresis, fatigue and fever.  HENT:  Positive for hearing loss. Negative for congestion.   Eyes:  Negative for visual disturbance.  Respiratory:  Negative for cough, chest tightness, shortness of breath and wheezing.   Cardiovascular:  Negative for chest pain, palpitations and leg swelling.  Gastrointestinal:  Negative for abdominal pain, constipation, diarrhea, nausea and vomiting.  Genitourinary:  Negative for dysuria, frequency and hematuria.  Musculoskeletal:  Negative for arthralgias and neck pain.  Skin:  Negative for rash.  Neurological:  Negative for dizziness, weakness, light-headedness, numbness and headaches.  Hematological:  Negative for adenopathy.  Psychiatric/Behavioral:  Negative for behavioral problems, dysphoric mood and sleep disturbance.    Per HPI unless specifically indicated above      Objective:    BP 126/84 (BP Location: Left Arm, Cuff Size: Normal)   Pulse 66   Ht 5' 5.5" (1.664 m)   Wt 192 lb 6.4  oz (87.3 kg)   SpO2 100%   BMI 31.53 kg/m   Wt Readings from Last 3 Encounters:  09/13/22 192 lb 6.4 oz (87.3 kg)  08/10/22 190 lb (86.2 kg)  11/10/21 190 lb 12.8 oz (86.5 kg)    Physical Exam Vitals and nursing note reviewed.  Constitutional:      General: She is not in acute distress.    Appearance: She is well-developed. She is not diaphoretic.     Comments: Well-appearing, comfortable, cooperative  HENT:     Head: Normocephalic and atraumatic.  Eyes:     General:        Right eye: No discharge.        Left eye: No discharge.     Conjunctiva/sclera: Conjunctivae normal.     Pupils: Pupils are equal, round, and reactive to light.  Neck:     Thyroid: No thyromegaly.  Cardiovascular:     Rate and Rhythm: Normal rate and regular rhythm.     Pulses: Normal pulses.     Heart sounds: Normal heart sounds. No murmur heard. Pulmonary:     Effort: Pulmonary effort is normal. No respiratory distress.     Breath sounds: Normal breath sounds. No wheezing or rales.  Abdominal:     General: Bowel sounds are normal. There is no distension.     Palpations: Abdomen is soft. There is no mass.     Tenderness: There is no abdominal tenderness.  Musculoskeletal:        General: No tenderness. Normal range of motion.     Cervical back: Normal range of motion and neck supple.     Comments: Upper / Lower Extremities: - Normal muscle tone, strength bilateral upper extremities 5/5, lower extremities 5/5  Lymphadenopathy:     Cervical: No cervical adenopathy.  Skin:    General: Skin is warm and dry.     Findings: No erythema or rash.  Neurological:     Mental Status: She is alert and oriented to person, place, and time.     Comments: Distal  sensation intact to light touch all extremities  Psychiatric:        Mood and Affect: Mood normal.        Behavior: Behavior normal.        Thought Content: Thought content normal.     Comments: Well groomed, good eye contact, normal speech and thoughts       Results for orders placed or performed in visit on 09/05/22  T4, free  Result Value Ref Range   Free T4 1.5 0.8 - 1.8 ng/dL  TSH  Result Value Ref Range   TSH 0.80 0.40 - 4.50 mIU/L  Vitamin D (25 hydroxy)  Result Value Ref Range   Vit D, 25-Hydroxy 65 30 - 100 ng/mL  Comprehensive Metabolic Panel (CMET)  Result Value Ref Range   Glucose, Bld 86 65 - 99 mg/dL   BUN 15 7 - 25 mg/dL   Creat 0.99 0.60 - 1.00 mg/dL   BUN/Creatinine Ratio SEE NOTE: 6 - 22 (calc)   Sodium 139 135 - 146 mmol/L   Potassium 4.0 3.5 - 5.3 mmol/L   Chloride 100 98 - 110 mmol/L   CO2 28 20 - 32 mmol/L   Calcium 9.6 8.6 - 10.4 mg/dL   Total Protein 6.6 6.1 - 8.1 g/dL   Albumin 4.3 3.6 - 5.1 g/dL   Globulin 2.3 1.9 - 3.7 g/dL (calc)   AG Ratio 1.9 1.0 - 2.5 (calc)   Total Bilirubin 0.9 0.2 - 1.2 mg/dL   Alkaline phosphatase (APISO) 84 37 - 153 U/L   AST 21 10 - 35 U/L   ALT 18 6 - 29 U/L  Lipid panel  Result Value Ref Range   Cholesterol 201 (H) <200 mg/dL   HDL 71 > OR = 50 mg/dL   Triglycerides 94 <150 mg/dL   LDL Cholesterol (Calc) 110 (H) mg/dL (calc)   Total CHOL/HDL Ratio 2.8 <5.0 (calc)   Non-HDL Cholesterol (Calc) 130 (H) <130 mg/dL (calc)  CBC with Differential  Result Value Ref Range   WBC 6.1 3.8 - 10.8 Thousand/uL   RBC 4.26 3.80 - 5.10 Million/uL   Hemoglobin 12.7 11.7 - 15.5 g/dL   HCT 38.6 35.0 - 45.0 %   MCV 90.6 80.0 - 100.0 fL   MCH 29.8 27.0 - 33.0 pg   MCHC 32.9 32.0 - 36.0 g/dL   RDW 12.9 11.0 - 15.0 %   Platelets 233 140 - 400 Thousand/uL   MPV 12.1 7.5 - 12.5 fL   Neutro Abs 2,898 1,500 - 7,800 cells/uL   Lymphs Abs 2,117 850 - 3,900 cells/uL   Absolute Monocytes 647 200 - 950 cells/uL   Eosinophils Absolute 360 15 - 500 cells/uL   Basophils Absolute 79 0 - 200 cells/uL   Neutrophils Relative % 47.5 %   Total Lymphocyte 34.7 %   Monocytes Relative 10.6 %   Eosinophils Relative 5.9 %   Basophils Relative 1.3 %  HgB A1c  Result Value Ref Range   Hgb A1c  MFr Bld 5.5 <5.7 % of total Hgb   Mean Plasma Glucose 111 mg/dL   eAG (mmol/L) 6.2 mmol/L      Assessment & Plan:   Problem List Items Addressed This Visit     Benign hypertension with CKD (chronic kidney disease), stage II    Well-controlled HTN - Home BP readings improved control  Complication CKD-II    Plan:  1. Continue current BP regimen - Losartan '50mg'$  daily and HCTZ 12.'5mg'$  daily - refill 2.  Encourage improved lifestyle - low sodium diet, regular exercise 3. Continue monitor BP outside office, bring readings to next visit, if persistently >140/90 or new symptoms notify office sooner      Elevated hemoglobin A1c    Improved A1c to 5.5 Concern with obesity, HTN, HLD  Plan:  1. Not on any therapy currently  2. Encourage improved lifestyle - low carb, low sugar diet, reduce portion size, continue improving regular exercise      Hypothyroidism    Controlled hypothyroidism Last lab normal Continue Levothyroxine 49mg daily      Obesity (BMI 30.0-34.9)   Other Visit Diagnoses     Annual physical exam    -  Primary   Needs flu shot       Relevant Orders   Flu Vaccine QUAD High Dose(Fluad) (Completed)   Bilateral hearing loss, unspecified hearing loss type       Relevant Orders   Ambulatory referral to Audiology       Updated Health Maintenance information Reviewed recent lab results with patient Encouraged improvement to lifestyle with diet and exercise Goal of weight loss  Flu Shot today Upcoming Mammogram / Bone Density 09/17/22  Referral to Audiology given lack of some frequency hearing  Orders Placed This Encounter  Procedures   Flu Vaccine QUAD High Dose(Fluad)   Ambulatory referral to Audiology    Referral Priority:   Routine    Referral Type:   Audiology Exam    Referral Reason:   Specialty Services Required    Number of Visits Requested:   1     No orders of the defined types were placed in this encounter.     Follow up plan: Return  in about 1 year (around 09/14/2023) for 1 year fasting lab only then 1 week later Annual Physical.  Future labs 10/24  ANobie Putnam [employer] 09/13/2022, 8:34 AM

## 2022-09-13 NOTE — Assessment & Plan Note (Signed)
Improved A1c to 5.5 Concern with obesity, HTN, HLD  Plan:  1. Not on any therapy currently  2. Encourage improved lifestyle - low carb, low sugar diet, reduce portion size, continue improving regular exercise

## 2022-09-13 NOTE — Assessment & Plan Note (Signed)
Controlled hypothyroidism Last lab normal Continue Levothyroxine 31mg daily

## 2022-09-13 NOTE — Assessment & Plan Note (Signed)
Well-controlled HTN - Home BP readings improved control  Complication CKD-II    Plan:  1. Continue current BP regimen - Losartan '50mg'$  daily and HCTZ 12.'5mg'$  daily - refill 2. Encourage improved lifestyle - low sodium diet, regular exercise 3. Continue monitor BP outside office, bring readings to next visit, if persistently >140/90 or new symptoms notify office sooner

## 2022-09-17 ENCOUNTER — Ambulatory Visit
Admission: RE | Admit: 2022-09-17 | Discharge: 2022-09-17 | Disposition: A | Discharge status: Home / Self Care | Payer: Medicare Other | Source: Ambulatory Visit | Attending: Family Medicine | Admitting: Family Medicine

## 2022-09-17 DIAGNOSIS — Z780 Asymptomatic menopausal state: Secondary | ICD-10-CM | POA: Insufficient documentation

## 2022-09-17 DIAGNOSIS — Z1231 Encounter for screening mammogram for malignant neoplasm of breast: Secondary | ICD-10-CM | POA: Insufficient documentation

## 2022-09-17 DIAGNOSIS — M8589 Other specified disorders of bone density and structure, multiple sites: Secondary | ICD-10-CM | POA: Diagnosis not present

## 2022-10-08 DIAGNOSIS — H903 Sensorineural hearing loss, bilateral: Secondary | ICD-10-CM | POA: Diagnosis not present

## 2022-10-17 ENCOUNTER — Encounter: Payer: Self-pay | Admitting: Family Medicine

## 2022-10-17 DIAGNOSIS — H905 Unspecified sensorineural hearing loss: Secondary | ICD-10-CM | POA: Insufficient documentation

## 2022-10-22 ENCOUNTER — Other Ambulatory Visit: Payer: Self-pay | Admitting: Family Medicine

## 2022-10-22 DIAGNOSIS — E034 Atrophy of thyroid (acquired): Secondary | ICD-10-CM

## 2022-10-22 DIAGNOSIS — K219 Gastro-esophageal reflux disease without esophagitis: Secondary | ICD-10-CM

## 2022-10-22 DIAGNOSIS — E782 Mixed hyperlipidemia: Secondary | ICD-10-CM

## 2022-10-23 NOTE — Telephone Encounter (Signed)
Requested Prescriptions  Pending Prescriptions Disp Refills   levothyroxine (SYNTHROID) 88 MCG tablet [Pharmacy Med Name: Levothyroxine Sodium 88 MCG Oral Tablet] 100 tablet 0    Sig: TAKE 1 TABLET BY MOUTH DAILY  BEFORE BREAKFAST     Endocrinology:  Hypothyroid Agents Passed - 10/22/2022 10:18 PM      Passed - TSH in normal range and within 360 days    TSH  Date Value Ref Range Status  09/06/2022 0.80 0.40 - 4.50 mIU/L Final         Passed - Valid encounter within last 12 months    Recent Outpatient Visits           1 month ago Annual physical exam   [facility] Olin Hauser, DO   11 months ago Left medial knee pain   [place], DO   1 year ago Annual physical exam   [place], DO   2 years ago Annual physical exam   [facility] Olin Hauser, DO   3 years ago Annual physical exam   [facility] [place], Devonne Doughty, DO               ezetimibe (ZETIA) 10 MG tablet [Pharmacy Med Name: Ezetimibe 10 MG Oral Tablet] 100 tablet 0    Sig: TAKE 1 TABLET BY MOUTH DAILY     Cardiovascular:  Antilipid - Sterol Transport Inhibitors Failed - 10/22/2022 10:18 PM      Failed - Lipid Panel in normal range within the last 12 months    Cholesterol, Total  Date Value Ref Range Status  08/30/2021 179 100 - 199 mg/dL Final   Cholesterol  Date Value Ref Range Status  09/06/2022 201 (H) <200 mg/dL Final   LDL Cholesterol (Calc)  Date Value Ref Range Status  09/06/2022 110 (H) mg/dL (calc) Final    Comment:    Reference range: <100 . Desirable range <100 mg/dL for primary prevention;   <70 mg/dL for patients with CHD or diabetic patients  with > or = 2 CHD risk factors. Marland Kitchen LDL-C is now calculated using the Martin-Hopkins  calculation, which is a validated novel method providing  better accuracy than the Friedewald equation  in the  estimation of LDL-C.  Cresenciano Genre et al. Annamaria Helling. 5784;696(29): 2061-2068  (http://education.[facility].com/faq/FAQ164)    HDL  Date Value Ref Range Status  09/06/2022 71 > OR = 50 mg/dL Final  08/30/2021 70 >39 mg/dL Final   Triglycerides  Date Value Ref Range Status  09/06/2022 94 <150 mg/dL Final         Passed - AST in normal range and within 360 days    AST  Date Value Ref Range Status  09/06/2022 21 10 - 35 U/L Final         Passed - ALT in normal range and within 360 days    ALT  Date Value Ref Range Status  09/06/2022 18 6 - 29 U/L Final         Passed - Patient is not pregnant      Passed - Valid encounter within last 12 months    Recent Outpatient Visits           1 month ago Annual physical exam   [place], DO   11 months ago Left medial knee pain   [facility] [place], Devonne Doughty,  DO   1 year ago Annual physical exam   [place], Devonne Doughty, DO   2 years ago Annual physical exam   [facility] Olin Hauser, DO   3 years ago Annual physical exam   [place], DO               pantoprazole ([place]) 40 MG tablet [Pharmacy Med Name: Pantoprazole Sodium 40 MG Oral Tablet Delayed Release] 100 tablet 0    Sig: TAKE 1 TABLET BY [place]     Gastroenterology: Proton Pump Inhibitors Passed - 10/22/2022 10:18 PM      Passed - Valid encounter within last 12 months    Recent Outpatient Visits           1 month ago Annual physical exam   [place], DO   11 months ago Left medial knee pain   [place], DO   1 year ago Annual physical exam   [facility] Olin Hauser, DO   2 years ago Annual physical exam   [facility] Olin Hauser, DO   3 years ago Annual physical exam   [facility] [place], Devonne Doughty, DO

## 2022-11-17 ENCOUNTER — Other Ambulatory Visit: Payer: Self-pay | Admitting: Family Medicine

## 2022-11-17 DIAGNOSIS — E782 Mixed hyperlipidemia: Secondary | ICD-10-CM

## 2022-11-19 NOTE — Telephone Encounter (Signed)
Requested Prescriptions  Pending Prescriptions Disp Refills   rosuvastatin (CRESTOR) 5 MG tablet [Pharmacy Med Name: Rosuvastatin Calcium 5 MG Oral Tablet] 43 tablet 0    Sig: TAKE 1 TABLET BY MOUTH 3 TIMES  WEEKLY     Cardiovascular:  Antilipid - Statins 2 Failed - 11/17/2022 11:13 PM      Failed - Lipid Panel in normal range within the last 12 months    Cholesterol, Total  Date Value Ref Range Status  08/30/2021 179 100 - 199 mg/dL Final   Cholesterol  Date Value Ref Range Status  09/06/2022 201 (H) <200 mg/dL Final   LDL Cholesterol (Calc)  Date Value Ref Range Status  09/06/2022 110 (H) mg/dL (calc) Final    Comment:    Reference range: <100 . Desirable range <100 mg/dL for primary prevention;   <70 mg/dL for patients with CHD or diabetic patients  with > or = 2 CHD risk factors. Marland Kitchen LDL-C is now calculated using the Martin-Hopkins  calculation, which is a validated novel method providing  better accuracy than the Friedewald equation in the  estimation of LDL-C.  Cresenciano Genre et al. Annamaria Helling. 0960;454(09): 2061-2068  (http://education.[facility].com/faq/FAQ164)    HDL  Date Value Ref Range Status  09/06/2022 71 > OR = 50 mg/dL Final  08/30/2021 70 >39 mg/dL Final   Triglycerides  Date Value Ref Range Status  09/06/2022 94 <150 mg/dL Final         Passed - Cr in normal range and within 360 days    Creat  Date Value Ref Range Status  09/06/2022 0.99 0.60 - 1.00 mg/dL Final         Passed - Patient is not pregnant      Passed - Valid encounter within last 12 months    Recent Outpatient Visits           2 months ago Annual physical exam   [place], DO   1 year ago Left medial knee pain   [place], DO   1 year ago Annual physical exam   [facility] Olin Hauser, DO   2 years ago Annual physical exam   [facility] Olin Hauser, DO   3 years ago Annual physical exam   [facility] [address] [place], Devonne Doughty, DO

## 2022-12-31 ENCOUNTER — Other Ambulatory Visit: Payer: Self-pay | Admitting: Family Medicine

## 2022-12-31 DIAGNOSIS — E782 Mixed hyperlipidemia: Secondary | ICD-10-CM

## 2022-12-31 DIAGNOSIS — E034 Atrophy of thyroid (acquired): Secondary | ICD-10-CM

## 2022-12-31 DIAGNOSIS — K219 Gastro-esophageal reflux disease without esophagitis: Secondary | ICD-10-CM

## 2023-01-01 NOTE — Telephone Encounter (Signed)
Requested Prescriptions  Pending Prescriptions Disp Refills   pantoprazole (PROTONIX) 40 MG tablet [Pharmacy Med Name: Pantoprazole Sodium 40 MG Oral Tablet Delayed Release] 100 tablet 2    Sig: TAKE 1 TABLET BY MOUTH DAILY  BEFORE BREAKFAST     Gastroenterology: Proton Pump Inhibitors Passed - 12/31/2022 11:00 PM      Passed - Valid encounter within last 12 months    Recent Outpatient Visits           3 months ago Annual physical exam   [facility] Olin Hauser, DO   1 year ago Left medial knee pain   [place], DO   1 year ago Annual physical exam   [facility] Olin Hauser, DO   2 years ago Annual physical exam   [facility] Olin Hauser, DO   3 years ago Annual physical exam   [facility] [place], Devonne Doughty, DO               ezetimibe (ZETIA) 10 MG tablet [Pharmacy Med Name: Ezetimibe 10 MG Oral Tablet] 100 tablet 2    Sig: TAKE 1 TABLET BY MOUTH DAILY     Cardiovascular:  Antilipid - Sterol Transport Inhibitors Failed - 12/31/2022 11:00 PM      Failed - Lipid Panel in normal range within the last 12 months    Cholesterol, Total  Date Value Ref Range Status  08/30/2021 179 100 - 199 mg/dL Final   Cholesterol  Date Value Ref Range Status  09/06/2022 201 (H) <200 mg/dL Final   LDL Cholesterol (Calc)  Date Value Ref Range Status  09/06/2022 110 (H) mg/dL (calc) Final    Comment:    Reference range: <100 . Desirable range <100 mg/dL for primary prevention;   <70 mg/dL for patients with CHD or diabetic patients  with > or = 2 CHD risk factors. Marland Kitchen LDL-C is now calculated using the Martin-Hopkins  calculation, which is a validated novel method providing  better accuracy than the Friedewald equation in the  estimation of LDL-C.  Cresenciano Genre et al. Annamaria Helling.  0175;102(58): 2061-2068  (http://education.[facility].com/faq/FAQ164)    HDL  Date Value Ref Range Status  09/06/2022 71 > OR = 50 mg/dL Final  08/30/2021 70 >39 mg/dL Final   Triglycerides  Date Value Ref Range Status  09/06/2022 94 <150 mg/dL Final         Passed - AST in normal range and within 360 days    AST  Date Value Ref Range Status  09/06/2022 21 10 - 35 U/L Final         Passed - ALT in normal range and within 360 days    ALT  Date Value Ref Range Status  09/06/2022 18 6 - 29 U/L Final         Passed - Patient is not pregnant      Passed - Valid encounter within last 12 months    Recent Outpatient Visits           3 months ago Annual physical exam   [place], DO   1 year ago Left medial knee pain   [facility] Olin Hauser, DO   1 year ago Annual physical exam   [facility] [place], Devonne Doughty,  DO   2 years ago Annual physical exam   [facility] [place], Devonne Doughty, DO   3 years ago Annual physical exam   [facility] [place], Devonne Doughty, DO               levothyroxine (SYNTHROID) 88 MCG tablet [Pharmacy Med Name: Levothyroxine Sodium 88 MCG Oral Tablet] 100 tablet 2    Sig: TAKE 1 TABLET BY MOUTH DAILY  BEFORE BREAKFAST     Endocrinology:  Hypothyroid Agents Passed - 12/31/2022 11:00 PM      Passed - TSH in normal range and within 360 days    TSH  Date Value Ref Range Status  09/06/2022 0.80 0.40 - 4.50 mIU/L Final         Passed - Valid encounter within last 12 months    Recent Outpatient Visits           3 months ago Annual physical exam   [place], DO   1 year ago Left medial knee pain   [place], DO   1 year ago Annual physical  exam   [facility] Olin Hauser, DO   2 years ago Annual physical exam   [facility] Olin Hauser, DO   3 years ago Annual physical exam   [facility] [place], Devonne Doughty, [place]

## 2023-01-26 ENCOUNTER — Other Ambulatory Visit: Payer: Self-pay | Admitting: Family Medicine

## 2023-01-26 DIAGNOSIS — E782 Mixed hyperlipidemia: Secondary | ICD-10-CM

## 2023-01-28 NOTE — Telephone Encounter (Signed)
Requested Prescriptions  Pending Prescriptions Disp Refills   rosuvastatin (CRESTOR) 5 MG tablet [Pharmacy Med Name: Rosuvastatin Calcium 5 MG Oral Tablet] 36 tablet 2    Sig: TAKE 1 TABLET BY MOUTH 3 TIMES  WEEKLY     Cardiovascular:  Antilipid - Statins 2 Failed - 01/26/2023 10:35 PM      Failed - Lipid Panel in normal range within the last 12 months    Cholesterol, Total  Date Value Ref Range Status  08/30/2021 179 100 - 199 mg/dL Final   Cholesterol  Date Value Ref Range Status  09/06/2022 201 (H) <200 mg/dL Final   LDL Cholesterol (Calc)  Date Value Ref Range Status  09/06/2022 110 (H) mg/dL (calc) Final    Comment:    Reference range: <100 . Desirable range <100 mg/dL for primary prevention;   <70 mg/dL for patients with CHD or diabetic patients  with > or = 2 CHD risk factors. Marland Kitchen LDL-C is now calculated using the Martin-Hopkins  calculation, which is a validated novel method providing  better accuracy than the Friedewald equation in the  estimation of LDL-C.  Cresenciano Genre et al. Annamaria Helling. [id number]): 2061-2068  (http://education.[facility].com/faq/FAQ164)    HDL  Date Value Ref Range Status  09/06/2022 71 > OR = 50 mg/dL Final  08/30/2021 70 >39 mg/dL Final   Triglycerides  Date Value Ref Range Status  09/06/2022 94 <150 mg/dL Final         Passed - Cr in normal range and within 360 days    Creat  Date Value Ref Range Status  09/06/2022 0.99 0.60 - 1.00 mg/dL Final         Passed - Patient is not pregnant      Passed - Valid encounter within last 12 months    Recent Outpatient Visits           4 months ago Annual physical exam   [place], DO   1 year ago Left medial knee pain   [facility] Olin Hauser, DO   1 year ago Annual physical exam   [facility] Olin Hauser, DO   2 years ago Annual physical exam   [facility] Olin Hauser, DO   3 years ago Annual physical exam   [facility] [place], Devonne Doughty, [place]

## 2023-05-10 ENCOUNTER — Other Ambulatory Visit: Payer: Self-pay | Admitting: Family Medicine

## 2023-05-10 DIAGNOSIS — I129 Hypertensive chronic kidney disease with stage 1 through stage 4 chronic kidney disease, or unspecified chronic kidney disease: Secondary | ICD-10-CM

## 2023-05-13 NOTE — Telephone Encounter (Signed)
Courtesy refill. Patient will need an office visit for further refills. Requested Prescriptions  Pending Prescriptions Disp Refills   hydrochlorothiazide (HYDRODIURIL) 12.5 MG tablet [Pharmacy Med Name: hydroCHLOROthiazide 12.5 MG Oral Tablet] 30 tablet 0    Sig: TAKE 1 TABLET BY MOUTH DAILY     Cardiovascular: Diuretics - Thiazide Failed - 05/10/2023 10:15 PM      Failed - Cr in normal range and within 180 days    Creat  Date Value Ref Range Status  09/06/2022 0.99 0.60 - 1.00 mg/dL Final         Failed - K in normal range and within 180 days    Potassium  Date Value Ref Range Status  09/06/2022 4.0 3.5 - 5.3 mmol/L Final  10/29/2013 3.6 3.5 - 5.1 mmol/L Final         Failed - Na in normal range and within 180 days    Sodium  Date Value Ref Range Status  09/06/2022 139 135 - 146 mmol/L Final  08/30/2021 141 134 - 144 mmol/L Final  10/29/2013 140 136 - 145 mmol/L Final         Failed - Valid encounter within last 6 months    Recent Outpatient Visits           8 months ago Annual physical exam   [place] [facility] Smitty Cords, DO   1 year ago Left medial knee pain   [facility] Smitty Cords, DO   1 year ago Annual physical exam   [facility] Smitty Cords, DO   2 years ago Annual physical exam   [facility] Smitty Cords, DO   3 years ago Annual physical exam   [facility] Smitty Cords, DO              Passed - Last BP in normal range    BP Readings from Last 1 Encounters:  09/13/22 126/84

## 2023-05-13 NOTE — Telephone Encounter (Signed)
Called pt - left message on machine to call back and schedule OV. 

## 2023-05-14 ENCOUNTER — Ambulatory Visit (INDEPENDENT_AMBULATORY_CARE_PROVIDER_SITE_OTHER): Payer: Medicare Other | Admitting: Family Medicine

## 2023-05-14 ENCOUNTER — Encounter: Payer: Self-pay | Admitting: Family Medicine

## 2023-05-14 VITALS — BP 116/76 | HR 64 | Temp 97.1°F | Resp 18 | Ht 65.5 in | Wt 200.8 lb

## 2023-05-14 DIAGNOSIS — Z1211 Encounter for screening for malignant neoplasm of colon: Secondary | ICD-10-CM | POA: Diagnosis not present

## 2023-05-14 DIAGNOSIS — E669 Obesity, unspecified: Secondary | ICD-10-CM | POA: Diagnosis not present

## 2023-05-14 DIAGNOSIS — E034 Atrophy of thyroid (acquired): Secondary | ICD-10-CM

## 2023-05-14 DIAGNOSIS — I10 Essential (primary) hypertension: Secondary | ICD-10-CM | POA: Diagnosis not present

## 2023-05-14 DIAGNOSIS — K635 Polyp of colon: Secondary | ICD-10-CM

## 2023-05-14 DIAGNOSIS — E782 Mixed hyperlipidemia: Secondary | ICD-10-CM | POA: Diagnosis not present

## 2023-05-14 NOTE — Assessment & Plan Note (Signed)
Well-controlled HTN - Home BP readings improved control     Plan:  Discontinue Hydrochlorothiazide 12.5mg  daily, this fluid BP medication may no longer be needed. Continue current BP regimen - Losartan 50mg  daily Encourage improved lifestyle - low sodium diet, regular exercise Continue monitor BP outside office, bring readings to next visit, if persistently >140/90 or new symptoms notify office sooner

## 2023-05-14 NOTE — Assessment & Plan Note (Addendum)
Previously Controlled hypothyroidism Continue Levothyroxine daily

## 2023-05-14 NOTE — Progress Notes (Signed)
Subjective:    Patient ID: Jillian Fritz, female    DOB: 1951-02-12, 72 y.o.   MRN: [id number]  Jillian Fritz is a 72 y.o. female presenting on 05/14/2023 for Hypertension   HPI  CHRONIC HTN Creatinine improved - Today patient reports still overall doing well with home BP readings controlled Asking about adjusting med Current Meds - Losartan 50mg  daily, HCTZ 12.5mg  daily Tolerating well, w/o complaints. - Still improved edema Not taking NSAIDs   GERD Previously controlled. had esophageal dilatation in past   History of Heart Murmur   Elevated A1c Last lab A1c 5.5 (09/2022) Meds: not on meds Currently on ARB Limited regular exercise, plans to improve this in future when retire.   HYPERLIPIDEMIA: prior elevated LDL and total cholesterol, previously improved on meds Significant improvement now with improved diet lifestyle, wt loss and GoLow, walking more - taking Zetia 10 and Rosuva 5mg  intermittent - last lab showed elevated LDL still    Walking less after knee problem Not losing as much weight Not as active  Hypothyroidism: Last labs 09/2022 TSH and Free T4 normal Continues on Levothyroxine daily - No significant symptoms   Osteopenia, spine / Vitamin D Deficiency Vitamin D up to 65 On supplement   Health Maintenance:  Last Colonoscopy 04/15/20 Dr Servando Snare - polyps, repeat 3+ years   Past Medical History:  Diagnosis Date   Arthritis    knuckles   Asthma    childhood - exercise induced   Dental bridge present    flexible, removable, upper, fits snuggly   GERD (gastroesophageal reflux disease)    Heart murmur    states she has 2 murmurs/no issues except when pregnant   Hypertension    controlled on meds   Hypothyroidism    Migraines    2 or 3 years ago   Motion sickness    cars, planes   Obesity    Shortness of breath dyspnea    Wears contact lenses         05/14/2023   10:37 AM 09/13/2022   10:11 AM 08/10/2022   10:04 AM   Depression screen PHQ 2/9  Decreased Interest 0 0 0  Down, Depressed, Hopeless 0 0 0  PHQ - 2 Score 0 0 0  Altered sleeping 0 0 0  Tired, decreased energy 0 0 0  Change in appetite 0 0 0  Feeling bad or failure about yourself  0 0 0  Trouble concentrating 0 0 0  Moving slowly or fidgety/restless 0 0 0  Suicidal thoughts 0 0 0  PHQ-9 Score 0 0 0  Difficult doing work/chores Not difficult at all Not difficult at all Not difficult at all    Social History   Tobacco Use   Smoking status: Never   Smokeless tobacco: Never  Vaping Use   Vaping Use: Never used  Substance Use Topics   Alcohol use: No    Alcohol/week: 0.0 standard drinks of alcohol   Drug use: No    Review of Systems Per HPI unless specifically indicated above     Objective:    BP 116/76 (BP Location: Left Arm, Patient Position: Sitting, Cuff Size: Normal)   Pulse 64   Temp (!) 97.1 F (36.2 C) (Oral)   Resp 18   Ht 5' 5.5" (1.664 m)   Wt 200 lb 12.8 oz (91.1 kg)   SpO2 98%   BMI 32.91 kg/m   Wt Readings from Last 3 Encounters:  05/14/23 200 lb  12.8 oz (91.1 kg)  09/13/22 192 lb 6.4 oz (87.3 kg)  08/10/22 190 lb (86.2 kg)    Physical Exam Vitals and nursing note reviewed.  Constitutional:      General: She is not in acute distress.    Appearance: She is well-developed. She is not diaphoretic.     Comments: Well-appearing, comfortable, cooperative  HENT:     Head: Normocephalic and atraumatic.  Eyes:     General:        Right eye: No discharge.        Left eye: No discharge.     Conjunctiva/sclera: Conjunctivae normal.  Neck:     Thyroid: No thyromegaly.  Cardiovascular:     Rate and Rhythm: Normal rate and regular rhythm.     Heart sounds: Normal heart sounds. No murmur heard. Pulmonary:     Effort: Pulmonary effort is normal. No respiratory distress.     Breath sounds: Normal breath sounds. No wheezing or rales.  Musculoskeletal:        General: Normal range of motion.     Cervical  back: Normal range of motion and neck supple.  Lymphadenopathy:     Cervical: No cervical adenopathy.  Skin:    General: Skin is warm and dry.     Findings: No erythema or rash.  Neurological:     Mental Status: She is alert and oriented to person, place, and time.  Psychiatric:        Behavior: Behavior normal.     Comments: Well groomed, good eye contact, normal speech and thoughts      Results for orders placed or performed in visit on 09/05/22  T4, free  Result Value Ref Range   Free T4 1.5 0.8 - 1.8 ng/dL  TSH  Result Value Ref Range   TSH 0.80 0.40 - 4.50 mIU/L  Vitamin D (25 hydroxy)  Result Value Ref Range   Vit D, 25-Hydroxy 65 30 - 100 ng/mL  Comprehensive Metabolic Panel (CMET)  Result Value Ref Range   Glucose, Bld 86 65 - 99 mg/dL   BUN 15 7 - 25 mg/dL   Creat 1.61 0.96 - 0.45 mg/dL   BUN/Creatinine Ratio SEE NOTE: 6 - 22 (calc)   Sodium 139 135 - 146 mmol/L   Potassium 4.0 3.5 - 5.3 mmol/L   Chloride 100 98 - 110 mmol/L   CO2 28 20 - 32 mmol/L   Calcium 9.6 8.6 - 10.4 mg/dL   Total Protein 6.6 6.1 - 8.1 g/dL   Albumin 4.3 3.6 - 5.1 g/dL   Globulin 2.3 1.9 - 3.7 g/dL (calc)   AG Ratio 1.9 1.0 - 2.5 (calc)   Total Bilirubin 0.9 0.2 - 1.2 mg/dL   Alkaline phosphatase (APISO) 84 37 - 153 U/L   AST 21 10 - 35 U/L   ALT 18 6 - 29 U/L  Lipid panel  Result Value Ref Range   Cholesterol 201 (H) <200 mg/dL   HDL 71 > OR = 50 mg/dL   Triglycerides 94 <409 mg/dL   LDL Cholesterol (Calc) 110 (H) mg/dL (calc)   Total CHOL/HDL Ratio 2.8 <5.0 (calc)   Non-HDL Cholesterol (Calc) 130 (H) <130 mg/dL (calc)  CBC with Differential  Result Value Ref Range   WBC 6.1 3.8 - 10.8 Thousand/uL   RBC 4.26 3.80 - 5.10 Million/uL   Hemoglobin 12.7 11.7 - 15.5 g/dL   HCT 81.1 91.4 - 78.2 %   MCV 90.6 80.0 - 100.0 fL  MCH 29.8 27.0 - 33.0 pg   MCHC 32.9 32.0 - 36.0 g/dL   RDW 16.1 09.6 - 04.5 %   Platelets 233 140 - 400 Thousand/uL   MPV 12.1 7.5 - 12.5 fL   Neutro Abs  2,898 1,500 - 7,800 cells/uL   Lymphs Abs 2,117 850 - 3,900 cells/uL   Absolute Monocytes 647 200 - 950 cells/uL   Eosinophils Absolute 360 15 - 500 cells/uL   Basophils Absolute 79 0 - 200 cells/uL   Neutrophils Relative % 47.5 %   Total Lymphocyte 34.7 %   Monocytes Relative 10.6 %   Eosinophils Relative 5.9 %   Basophils Relative 1.3 %  HgB A1c  Result Value Ref Range   Hgb A1c MFr Bld 5.5 <5.7 % of total Hgb   Mean Plasma Glucose 111 mg/dL   eAG (mmol/L) 6.2 mmol/L      Assessment & Plan:   Problem List Items Addressed This Visit     Essential hypertension - Primary    Well-controlled HTN - Home BP readings improved control     Plan:  Discontinue Hydrochlorothiazide 12.5mg  daily, this fluid BP medication may no longer be needed. Continue current BP regimen - Losartan 50mg  daily Encourage improved lifestyle - low sodium diet, regular exercise Continue monitor BP outside office, bring readings to next visit, if persistently >140/90 or new symptoms notify office sooner      Hypothyroidism    Previously Controlled hypothyroidism Continue Levothyroxine daily      Mixed hyperlipidemia    Very well controlled lipids on med combination The 10-year ASCVD risk score (Arnett DK, et al., 2019) is: 11.2%  Plan: 1. Continue Rosuvastatin 5mg  TIW and Zetia I recommend keeping the Rosuvastatin intermittently and the Zetia + Aspirin.  2. Encourage improved lifestyle - low carb/cholesterol, reduce portion size, continue improving regular exercise      Obesity (BMI 30.0-34.9)   Polyp of ascending colon   Relevant Orders   Ambulatory referral to Gastroenterology   Other Visit Diagnoses     Screening for colon cancer       Relevant Orders   Ambulatory referral to Gastroenterology        All other meds reviewed, and have refills. If we missed any refills - let me know.  Referral sent back to Dr Servando Snare for repeat Colonoscopy, anytime. Last one done  04/15/20  [facility]) [address] [place], [place] [address] Main: [phone]  Orders Placed This Encounter  Procedures   Ambulatory referral to Gastroenterology    Referral Priority:   Routine    Referral Type:   Consultation    Referral Reason:   Specialty Services Required    Number of Visits Requested:   1     No orders of the defined types were placed in this encounter.     Follow up plan: Return in about 4 months (around 09/13/2023) for 4 month fasting lab only then 1 week later Annual Physical.  Future labs ordered for 09/2023   Saralyn Pilar, DO [facility] 05/14/2023, 10:29 AM

## 2023-05-14 NOTE — Assessment & Plan Note (Signed)
Very well controlled lipids on med combination The 10-year ASCVD risk score (Arnett DK, et al., 2019) is: 11.2%  Plan: 1. Continue Rosuvastatin 5mg  TIW and Zetia I recommend keeping the Rosuvastatin intermittently and the Zetia + Aspirin.  2. Encourage improved lifestyle - low carb/cholesterol, reduce portion size, continue improving regular exercise

## 2023-05-14 NOTE — Patient Instructions (Addendum)
Thank you for coming to the office today.  Discontinue Hydrochlorothiazide 12.5mg  daily, this fluid BP medication may no longer be needed.  All other meds reviewed, and have refills. If we missed any refills - let me know.  I recommend keeping the Rosuvastatin intermittently and the Zetia + Aspirin.  Updated diagnosis for Blood Pressure = Essential Hypertension (deleted the Chronic Kidney Disease Stage 2)  Referral sent back to Dr Servando Snare for repeat Colonoscopy, anytime. Last one done 04/15/20  [facility]) [address] [place], [place] [address] Main: [phone]  DUE for FASTING BLOOD WORK (no food or drink after midnight before the lab appointment, only water or coffee without cream/sugar on the morning of)  SCHEDULE "Lab Only" visit in the morning at the clinic for lab draw in 4 MONTHS   - Make sure Lab Only appointment is at about 1 week before your next appointment, so that results will be available  For Lab Results, once available within 2-3 days of blood draw, you can can log in to MyChart online to view your results and a brief explanation. Also, we can discuss results at next follow-up visit.    Please schedule a Follow-up Appointment to: Return in about 4 months (around 09/13/2023) for 4 month fasting lab only then 1 week later Annual Physical.  If you have any other questions or concerns, please feel free to call the office or send a message through MyChart. You may also schedule an earlier appointment if necessary.  Additionally, you may be receiving a survey about your experience at our office within a few days to 1 week by e-mail or mail. We value your feedback.  Saralyn Pilar, DO [facility], [place]

## 2023-05-20 ENCOUNTER — Other Ambulatory Visit: Payer: Self-pay

## 2023-05-20 ENCOUNTER — Telehealth: Payer: Self-pay

## 2023-05-20 DIAGNOSIS — Z86010 Personal history of colonic polyps: Secondary | ICD-10-CM

## 2023-05-20 MED ORDER — NA SULFATE-K SULFATE-MG SULF 17.5-3.13-1.6 GM/177ML PO SOLN: 1.0000 | Freq: Once | ORAL | 0 refills | Status: AC

## 2023-05-20 NOTE — Telephone Encounter (Signed)
Gastroenterology Pre-Procedure Review  Request Date: 07/15/23 Requesting Physician: Dr. Servando Snare  PATIENT REVIEW QUESTIONS: The patient responded to the following health history questions as indicated:    1. Are you having any GI issues? no 2. Do you have a personal history of Polyps? Yes last colonoscopy 04/15/20 performed by Dr. Servando Snare polyp was noted 3. Do you have a family history of Colon Cancer or Polyps? no 4. Diabetes Mellitus? no 5. Joint replacements in the past 12 months?no 6. Major health problems in the past 3 months?no 7. Any artificial heart valves, MVP, or defibrillator?no    MEDICATIONS & ALLERGIES:    Patient reports the following regarding taking any anticoagulation/antiplatelet therapy:   Plavix, Coumadin, Eliquis, Xarelto, Lovenox, Pradaxa, Brilinta, or Effient? no Aspirin? yes (81 mg daily)  Patient confirms/reports the following medications:  Current Outpatient Medications  Medication Sig Dispense Refill   aspirin EC 81 MG tablet Take 1 tablet (81 mg total) by mouth daily.     Cholecalciferol (VITAMIN D3) 50 MCG (2000 UT) TABS Take by mouth.     Coenzyme Q10 (COQ10) 100 MG CAPS Take by mouth.     ezetimibe (ZETIA) 10 MG tablet TAKE 1 TABLET BY MOUTH DAILY 100 tablet 2   ibuprofen (ADVIL) 800 MG tablet Take one tablet po tid.     levothyroxine (SYNTHROID) 88 MCG tablet TAKE 1 TABLET BY MOUTH DAILY  BEFORE BREAKFAST 100 tablet 2   losartan (COZAAR) 50 MG tablet TAKE 1 TABLET BY MOUTH  DAILY 100 tablet 3   pantoprazole (PROTONIX) 40 MG tablet TAKE 1 TABLET BY MOUTH DAILY  BEFORE BREAKFAST 100 tablet 2   Probiotic Product (PROBIOTIC DAILY PO) Take by mouth daily.     rosuvastatin (CRESTOR) 5 MG tablet TAKE 1 TABLET BY MOUTH 3 TIMES  WEEKLY 43 tablet 2   No current facility-administered medications for this visit.    Patient confirms/reports the following allergies:  Allergies  Allergen Reactions   Penicillins Anaphylaxis and Hives   Sulfa Antibiotics Anaphylaxis  and Hives   Statins Swelling   Dill Oil Nausea And Vomiting    Dill (spice)   Lisinopril Cough   Peach [Prunus Persica] Hives   Strawberry (Diagnostic) Hives   Propofol Itching and Rash    No orders of the defined types were placed in this encounter.   AUTHORIZATION INFORMATION Primary Insurance: 1D#: Group #:  Secondary Insurance: 1D#: Group #:  SCHEDULE INFORMATION: Date: 07/15/23 Time: Location: [facility]

## 2023-05-29 ENCOUNTER — Other Ambulatory Visit: Payer: Self-pay | Admitting: Family Medicine

## 2023-05-30 NOTE — Telephone Encounter (Signed)
Requested medication (s) are due for refill today:   Not sure  Requested medication (s) are on the active medication list:   Yes with a note that he is not taking it 05/20/2023 labeled as historic  Future visit scheduled:   Yes   Last ordered: 05/15/2023 by a historical provider    Requested Prescriptions  Pending Prescriptions Disp Refills   hydrochlorothiazide (HYDRODIURIL) 12.5 MG tablet [Pharmacy Med Name: hydroCHLOROthiazide 12.5 MG Oral Tablet] 30 tablet 11    Sig: TAKE 1 TABLET BY MOUTH DAILY     Cardiovascular: Diuretics - Thiazide Failed - 05/29/2023 10:29 PM      Failed - Cr in normal range and within 180 days    Creat  Date Value Ref Range Status  09/06/2022 0.99 0.60 - 1.00 mg/dL Final         Failed - K in normal range and within 180 days    Potassium  Date Value Ref Range Status  09/06/2022 4.0 3.5 - 5.3 mmol/L Final  10/29/2013 3.6 3.5 - 5.1 mmol/L Final         Failed - Na in normal range and within 180 days    Sodium  Date Value Ref Range Status  09/06/2022 139 135 - 146 mmol/L Final  08/30/2021 141 134 - 144 mmol/L Final  10/29/2013 140 136 - 145 mmol/L Final         Passed - Last BP in normal range    BP Readings from Last 1 Encounters:  05/14/23 116/76         Passed - Valid encounter within last 6 months    Recent Outpatient Visits           2 weeks ago Essential hypertension   [place] [facility] Smitty Cords, DO   8 months ago Annual physical exam   [place] [facility] Smitty Cords, DO   1 year ago Left medial knee pain   [place] [facility] Smitty Cords, DO   1 year ago Annual physical exam   [place] [facility] Smitty Cords, DO   2 years ago Annual physical exam   [place] [facility] Smitty Cords, DO       Future Appointments             In 3 months Althea Charon, Netta Neat,  DO [facility] [facility], [facility]

## 2023-05-30 NOTE — Telephone Encounter (Signed)
Please contact patient to confirm status.  Hydrochlorothiazide 12.5mg  daily was discontinued by me at our visit on 05/14/23. We determined she may no longer need it for BP  Did she have an issue with elevated readings and needs to restart med? Or is this an auto refill / incorrect request?  Saralyn Pilar, DO [facility] 05/30/2023, 5:40 PM

## 2023-07-02 ENCOUNTER — Encounter: Payer: Self-pay | Admitting: Gastroenterology

## 2023-07-05 ENCOUNTER — Encounter: Payer: Self-pay | Admitting: Gastroenterology

## 2023-07-15 ENCOUNTER — Ambulatory Visit: Payer: Medicare Other | Admitting: Anesthesiology

## 2023-07-15 ENCOUNTER — Encounter: Payer: Self-pay | Admitting: Gastroenterology

## 2023-07-15 ENCOUNTER — Encounter
Admission: RE | Disposition: A | Discharge status: Home / Self Care | Payer: Self-pay | Source: Home / Self Care | Attending: Gastroenterology

## 2023-07-15 ENCOUNTER — Other Ambulatory Visit: Payer: Self-pay

## 2023-07-15 ENCOUNTER — Ambulatory Visit
Admission: RE | Admit: 2023-07-15 | Discharge: 2023-07-15 | Disposition: A | Discharge status: Home / Self Care | Payer: Medicare Other | Attending: Gastroenterology | Admitting: Gastroenterology

## 2023-07-15 ENCOUNTER — Telehealth: Payer: Self-pay

## 2023-07-15 DIAGNOSIS — Z1211 Encounter for screening for malignant neoplasm of colon: Secondary | ICD-10-CM | POA: Diagnosis not present

## 2023-07-15 DIAGNOSIS — Z860100 Personal history of colon polyps, unspecified: Secondary | ICD-10-CM

## 2023-07-15 DIAGNOSIS — E034 Atrophy of thyroid (acquired): Secondary | ICD-10-CM

## 2023-07-15 DIAGNOSIS — K640 First degree hemorrhoids: Secondary | ICD-10-CM | POA: Diagnosis not present

## 2023-07-15 HISTORY — DX: Unspecified sensorineural hearing loss: H90.5

## 2023-07-15 HISTORY — PX: COLONOSCOPY WITH PROPOFOL: SHX5780

## 2023-07-15 SURGERY — COLONOSCOPY WITH PROPOFOL
Anesthesia: General | Site: Rectum

## 2023-07-15 MED ORDER — STERILE WATER FOR IRRIGATION IR SOLN
Status: DC | PRN
  Administered 2023-07-15: 100 mL

## 2023-07-15 MED ORDER — SODIUM CHLORIDE 0.9 % IV SOLN: INTRAVENOUS | Status: DC

## 2023-07-15 MED ORDER — PROPOFOL 10 MG/ML IV BOLUS
INTRAVENOUS | Status: DC | PRN
Start: 2023-07-15 — End: 2023-07-15
  Administered 2023-07-15: 20 mg via INTRAVENOUS
  Administered 2023-07-15: 10 mg via INTRAVENOUS
  Administered 2023-07-15 (×2): 20 mg via INTRAVENOUS
  Administered 2023-07-15: 90 mg via INTRAVENOUS
  Administered 2023-07-15 (×2): 20 mg via INTRAVENOUS

## 2023-07-15 MED ORDER — LIDOCAINE HCL (CARDIAC) PF 100 MG/5ML IV SOSY
PREFILLED_SYRINGE | INTRAVENOUS | Status: DC | PRN
  Administered 2023-07-15: 100 mg via INTRAVENOUS

## 2023-07-15 MED ORDER — LACTATED RINGERS IV SOLN: INTRAVENOUS | Status: DC

## 2023-07-15 SURGICAL SUPPLY — 13 items
CLIP HMST 235XBRD CATH ROT (MISCELLANEOUS) IMPLANT
CLIP RESOLUTION 360 11X235 (MISCELLANEOUS)
ELECT REM PT RETURN 9FT ADLT (ELECTROSURGICAL)
ELECTRODE REM PT RTRN 9FT ADLT (ELECTROSURGICAL) IMPLANT
FORCEPS BIOP RAD 4 LRG CAP 4 (CUTTING FORCEPS) IMPLANT
GOWN CVR UNV OPN BCK APRN NK (MISCELLANEOUS) ×2 IMPLANT
GOWN ISOL THUMB LOOP REG UNIV (MISCELLANEOUS) ×2
KIT PRC NS LF DISP ENDO (KITS) ×1 IMPLANT
KIT PROCEDURE OLYMPUS (KITS) ×1
MANIFOLD NEPTUNE II (INSTRUMENTS) ×1 IMPLANT
SNARE COLD EXACTO (MISCELLANEOUS) IMPLANT
TRAP ETRAP POLY (MISCELLANEOUS) IMPLANT
WATER STERILE IRR 250ML POUR (IV SOLUTION) ×1 IMPLANT

## 2023-07-15 NOTE — Anesthesia Preprocedure Evaluation (Addendum)
Anesthesia Evaluation  Patient identified by MRN, date of birth, ID band Patient awake    Reviewed: Allergy & Precautions, H&P , NPO status , Patient's Chart, lab work & pertinent test results  Airway Mallampati: IV  TM Distance: <3 FB Neck ROM: Full    Dental no notable dental hx. (+) Partial Lower   Pulmonary neg pulmonary ROS, shortness of breath, asthma    Pulmonary exam normal breath sounds clear to auscultation       Cardiovascular hypertension, Normal cardiovascular exam+ Valvular Problems/Murmurs  Rhythm:Regular Rate:Normal     Neuro/Psych  Headaches negative neurological ROS  negative psych ROS   GI/Hepatic negative GI ROS, Neg liver ROS,GERD  ,,  Endo/Other  negative endocrine ROSHypothyroidism    Renal/GU negative Renal ROS  negative genitourinary   Musculoskeletal negative musculoskeletal ROS (+) Arthritis ,    Abdominal   Peds negative pediatric ROS (+)  Hematology negative hematology ROS (+)   Anesthesia Other Findings Obesity  Hypothyroidism  GERD (gastroesophageal reflux disease) Dental bridge present  Wears contact lenses Motion sickness  Hypertension Heart murmur  Asthma  Patient denies propofol allergy and I noted that she had propofol IV last time she had a colonoscopy and no record of any issues Migraines  Arthritis Sensorineural hearing loss     Reproductive/Obstetrics negative OB ROS                             Anesthesia Physical Anesthesia Plan  ASA: 2  Anesthesia Plan: General   Post-op Pain Management:    Induction: Intravenous  PONV Risk Score and Plan:   Airway Management Planned: Natural Airway and Nasal Cannula  Additional Equipment:   Intra-op Plan:   Post-operative Plan:   Informed Consent: I have reviewed the patients History and Physical, chart, labs and discussed the procedure including the risks, benefits and alternatives for  the proposed anesthesia with the patient or authorized representative who has indicated his/her understanding and acceptance.     Dental Advisory Given  Plan Discussed with: Anesthesiologist, CRNA and Surgeon  Anesthesia Plan Comments: (Patient consented for risks of anesthesia including but not limited to:  - adverse reactions to medications - risk of airway placement if required - damage to eyes, teeth, lips or other oral mucosa - nerve damage due to positioning  - sore throat or hoarseness - Damage to heart, brain, nerves, lungs, other parts of body or loss of life  Patient voiced understanding.)       Anesthesia Quick Evaluation

## 2023-07-15 NOTE — Telephone Encounter (Signed)
Copied from CRM [id number]. Topic: General - Other >> Jul 15, 2023  3:55 PM Dominique A wrote: Reason for CRM: Pt states that [facility] called her regarding her medication: levothyroxine (SYNTHROID) 88 MCG tablet. Per [facility] they are changing manufactures and is needing approval from pt PCP and the pt  to change due to them going to a new manufacture the levels could change for the prescription. Pt would like a call back to discuss.

## 2023-07-15 NOTE — Anesthesia Postprocedure Evaluation (Signed)
Anesthesia Post Note  Patient: Jillian Fritz  Procedure(s) Performed: COLONOSCOPY WITH PROPOFOL (Rectum)  Patient location during evaluation: PACU Anesthesia Type: General Level of consciousness: awake and alert Pain management: pain level controlled Vital Signs Assessment: post-procedure vital signs reviewed and stable Respiratory status: spontaneous breathing, nonlabored ventilation, respiratory function stable and patient connected to nasal cannula oxygen Cardiovascular status: blood pressure returned to baseline and stable Postop Assessment: no apparent nausea or vomiting Anesthetic complications: no   No notable events documented.   Last Vitals:  Vitals:   07/15/23 0951 07/15/23 0957  BP: 120/60 118/67  Pulse: 73 72  Resp: 20 11  Temp: (!) 36.2 C (!) 36.2 C  SpO2: 95% 96%    Last Pain:  Vitals:   07/15/23 0957  TempSrc:   PainSc: 0-No pain                  C 

## 2023-07-15 NOTE — Op Note (Addendum)
[facility]astroenter[facility] Patient Name: Jillian Fritz Procedure Date: 07/15/2023 9:21 AM MRN: [id number] Account #: [id number] Date of Birth: Mar 10, 1951 Admit Type: Outpatient Age: 72 Room: [facility] OR ROOM 01 Gender: Female Note Status: Finalized Instrument Name: [id number] Procedure:             Colonoscopy Indications:           High risk colon cancer surveillance: Personal history                         of colonic polyps Providers:             Midge Minium MD, MD Referring MD:          Smitty Cords (Referring MD) Medicines:             Propofol per Anesthesia Complications:         No immediate complications. Procedure:             Pre-Anesthesia Assessment:                        - Prior to the procedure, a History and Physical was                         performed, and patient medications and allergies were                         reviewed. The patient's tolerance of previous                         anesthesia was also reviewed. The risks and benefits                         of the procedure and the sedation options and risks                         were discussed with the patient. All questions were                         answered, and informed consent was obtained. Prior                         Anticoagulants: The patient has taken no anticoagulant                         or antiplatelet agents. ASA Grade Assessment: II - A                         patient with mild systemic disease. After reviewing                         the risks and benefits, the patient was deemed in                         satisfactory condition to undergo the procedure.                        After obtaining informed consent, the colonoscope was  passed under direct vision. Throughout the procedure,                         the patient's blood pressure, pulse, and oxygen                         saturations were monitored continuously. The was                          introduced through the anus and advanced to the the                         cecum, identified by appendiceal orifice and ileocecal                         valve. The colonoscopy was performed without                         difficulty. The patient tolerated the procedure well.                         The quality of the bowel preparation was excellent. Findings:      The perianal and digital rectal examinations were normal.      Non-bleeding internal hemorrhoids were found during retroflexion. The       hemorrhoids were Grade I (internal hemorrhoids that do not prolapse). Impression:            - Non-bleeding internal hemorrhoids.                        - No specimens collected. Recommendation:        - Discharge patient to home.                        - Resume previous diet.                        - Continue present medications.                        - Repeat colonoscopy in 10 years for screening                         purposes. Procedure Code(s):     --- Professional ---                        [phone], Colonoscopy, flexible; diagnostic, including                         collection of specimen(s) by brushing or washing, when                         performed (separate procedure) Diagnosis Code(s):     --- Professional ---                        Z86.010, Personal history of colonic polyps CPT copyright 2022 American Medical Association. All rights reserved. The codes documented in this report are preliminary and upon coder review may  be revised to meet current compliance requirements.    MD, MD 07/15/2023 9:49:10 AM This report has been signed electronically. Number of Addenda: 0 Note Initiated On: 07/15/2023 9:21 AM Scope Withdrawal Time: 0 hours 10 minutes 10 seconds  Total Procedure Duration: 0 hours 14 minutes 45 seconds  Estimated Blood Loss:  Estimated blood loss: none.      [facility]

## 2023-07-15 NOTE — Transfer of Care (Signed)
Immediate Anesthesia Transfer of Care Note  Patient: Jillian Fritz  Procedure(s) Performed: COLONOSCOPY WITH PROPOFOL (Rectum)  Patient Location: PACU  Anesthesia Type: General  Level of Consciousness: awake, alert  and patient cooperative  Airway and Oxygen Therapy: Patient Spontanous Breathing and Patient connected to supplemental oxygen  Post-op Assessment: Post-op Vital signs reviewed, Patient's Cardiovascular Status Stable, Respiratory Function Stable, Patent Airway and No signs of Nausea or vomiting  Post-op Vital Signs: Reviewed and stable  Complications: No notable events documented.

## 2023-07-15 NOTE — H&P (Signed)
Midge Minium, MD [facility] [address], [address] [place], [place] [address] Phone:[phone] Fax : [phone]  Primary Care Physician:  Smitty Cords, DO Primary Gastroenterologist:  Dr. Servando Snare  Pre-Procedure History & Physical: HPI:  Jillian Fritz is a 72 y.o. female is here for an colonoscopy.   Past Medical History:  Diagnosis Date   Arthritis    knuckles   Asthma    childhood - exercise induced   Dental bridge present    flexible, removable, upper, fits snuggly   GERD (gastroesophageal reflux disease)    Heart murmur    states she has 2 murmurs/no issues except when pregnant   Hypertension    controlled on meds   Hypothyroidism    Migraines    2 or 3 years ago   Motion sickness    cars, planes   Obesity    Sensorineural hearing loss    Shortness of breath dyspnea    Wears contact lenses     Past Surgical History:  Procedure Laterality Date   CERVICAL CONE BIOPSY     COLONOSCOPY WITH PROPOFOL N/A 04/14/2020   Procedure: COLONOSCOPY WITH BIOPSY;  Surgeon: Midge Minium, MD;  Location: [facility];  Service: Endoscopy;  Laterality: N/A;  priority 3   ESOPHAGOGASTRODUODENOSCOPY (EGD) WITH PROPOFOL N/A 03/23/2016   Procedure: ESOPHAGOGASTRODUODENOSCOPY (EGD) WITH esophageal dilation. ;  Surgeon: Midge Minium, MD;  Location: [facility];  Service: Endoscopy;  Laterality: N/A;   POLYPECTOMY N/A 04/14/2020   Procedure: POLYPECTOMY;  Surgeon: Midge Minium, MD;  Location: [facility];  Service: Endoscopy;  Laterality: N/A;  2 Clips placed at [facility] Colon Polyp site   THROAT SURGERY  04/2014    Prior to Admission medications   Medication Sig Start Date End Date Taking? Authorizing Provider  aspirin EC 81 MG tablet Take 1 tablet (81 mg total) by mouth daily. 05/31/17  Yes Karamalegos, Netta Neat, DO  Cholecalciferol (VITAMIN D3) 50 MCG (2000 UT) TABS Take by mouth.   Yes [provider]  Coenzyme Q10 (COQ10) 100 MG CAPS Take by  mouth.   Yes [provider]  ezetimibe (ZETIA) 10 MG tablet TAKE 1 TABLET BY MOUTH DAILY 01/01/23  Yes Karamalegos, Netta Neat, DO  hydrochlorothiazide (HYDRODIURIL) 12.5 MG tablet TAKE 1 TABLET BY MOUTH DAILY 05/31/23  Yes Karamalegos, Netta Neat, DO  levothyroxine (SYNTHROID) 88 MCG tablet TAKE 1 TABLET BY MOUTH DAILY  BEFORE BREAKFAST 01/01/23  Yes Karamalegos, Netta Neat, DO  losartan (COZAAR) 50 MG tablet TAKE 1 TABLET BY MOUTH  DAILY 09/11/22  Yes Karamalegos, Netta Neat, DO  pantoprazole (PROTONIX) 40 MG tablet TAKE 1 TABLET BY MOUTH DAILY  BEFORE BREAKFAST 01/01/23  Yes Karamalegos, Netta Neat, DO  Probiotic Product (PROBIOTIC DAILY PO) Take by mouth daily.   Yes [provider]  rosuvastatin (CRESTOR) 5 MG tablet TAKE 1 TABLET BY MOUTH 3 TIMES  WEEKLY 01/28/23  Yes Karamalegos, Netta Neat, DO  ibuprofen (ADVIL) 800 MG tablet Take one tablet po tid. Patient not taking: Reported on 07/05/2023    [provider]    Allergies as of 05/20/2023 - Review Complete 05/20/2023  Allergen Reaction Noted   Penicillins Anaphylaxis and Hives 12/05/2015   Sulfa antibiotics Anaphylaxis and Hives 12/05/2015   Statins Swelling 01/17/2016   Dill oil Nausea And Vomiting 09/30/2019   Lisinopril Cough 05/22/2016   Peach [prunus persica] Hives 09/30/2019   Strawberry (diagnostic) Hives 09/30/2019   Propofol Itching and Rash 04/19/2020    Family  History  Problem Relation Age of Onset   Alzheimer's disease Father    Hypertension Mother    Breast cancer Paternal Aunt        3 pat aunts   Thyroid disease Sister     Social History   Socioeconomic History   Marital status: Single    Spouse name: Not on file   Number of children: Not on file   Years of education: Not on file   Highest education level: Not on file  Occupational History   Occupation: [employer]  Tobacco Use   Smoking status: Never   Smokeless tobacco: Never  Vaping Use   Vaping status:  Never Used  Substance and Sexual Activity   Alcohol use: No    Alcohol/week: 0.0 standard drinks of alcohol   Drug use: No   Sexual activity: Not Currently  Other Topics Concern   Not on file  Social History Narrative   Not on file   Social Determinants of Health   Financial Resource Strain: Low Risk  (08/10/2022)   Overall Financial Resource Strain (CARDIA)    Difficulty of Paying Living Expenses: Not hard at all  Food Insecurity: No Food Insecurity (08/10/2022)   Hunger Vital Sign    Worried About Running Out of Food in the Last Year: Never true    Ran Out of Food in the Last Year: Never true  Transportation Needs: No Transportation Needs (08/10/2022)   PRAPARE - Administrator, Civil Service (Medical): No    Lack of Transportation (Non-Medical): No  Physical Activity: Insufficiently Active (08/10/2022)   Exercise Vital Sign    Days of Exercise per Week: 3 days    Minutes of Exercise per Session: 30 min  Stress: No Stress Concern Present (08/10/2022)   [employer] of Occupational Health - Occupational Stress Questionnaire    Feeling of Stress : Not at all  Social Connections: Moderately Isolated (08/10/2022)   Social Connection and Isolation Panel [NHANES]    Frequency of Communication with Friends and Family: More than three times a week    Frequency of Social Gatherings with Friends and Family: More than three times a week    Attends Religious Services: More than 4 times per year    Active Member of [employer] or Organizations: No    Attends Banker Meetings: Never    Marital Status: Widowed  Intimate Partner Violence: Not At Risk (08/10/2022)   Humiliation, Afraid, Rape, and Kick questionnaire    Fear of Current or Ex-Partner: No    Emotionally Abused: No    Physically Abused: No    Sexually Abused: No    Review of Systems: See HPI, otherwise negative ROS  Physical Exam: BP (!) 143/91   Temp 98.2 F (36.8 C) (Temporal)   Resp 17   Ht 5\' 5"  (1.651  m)   Wt 89.2 kg   SpO2 97%   BMI 32.72 kg/m  General:   Alert,  pleasant and cooperative in NAD Head:  Normocephalic and atraumatic. Neck:  Supple; no masses or thyromegaly. Lungs:  Clear throughout to auscultation.    Heart:  Regular rate and rhythm. Abdomen:  Soft, nontender and nondistended. Normal bowel sounds, without guarding, and without rebound.   Neurologic:  Alert and  oriented x4;  grossly normal neurologically.  Impression/Plan: Jillian Fritz is here for an colonoscopy to be performed for a history of adenomatous polyps on 2021   Risks, benefits, limitations, and alternatives regarding  colonoscopy  have been reviewed with the patient.  Questions have been answered.  All parties agreeable.   Midge Minium, MD  07/15/2023, 9:20 AM

## 2023-07-16 ENCOUNTER — Encounter: Payer: Self-pay | Admitting: Gastroenterology

## 2023-07-16 MED ORDER — LEVOTHYROXINE SODIUM 88 MCG PO TABS
88.0000 ug | ORAL_TABLET | Freq: Every day | ORAL | 1 refills | Status: AC
Start: 2023-07-16 — End: ?

## 2023-07-16 NOTE — Telephone Encounter (Signed)
Spoke with pharmacy and updated manufacturer.

## 2023-07-16 NOTE — Telephone Encounter (Signed)
Okay to change manufacturer. This is a very common situation. We can monitor the blood levels after the change usually 3-6 months  Jillian Pilar, DO [facility] 07/16/2023, 10:15 AM

## 2023-07-16 NOTE — Addendum Note (Signed)
Addended by: Judd Gaudier on: 07/16/2023 01:29 PM   Modules accepted: Orders

## 2023-08-09 ENCOUNTER — Other Ambulatory Visit: Payer: Self-pay | Admitting: Family Medicine

## 2023-08-12 NOTE — Telephone Encounter (Signed)
Requested Prescriptions  Refused Prescriptions Disp Refills   hydrochlorothiazide (HYDRODIURIL) 12.5 MG tablet [Pharmacy Med Name: hydroCHLOROthiazide 12.5 MG Oral Tablet] 100 tablet 2    Sig: TAKE 1 TABLET BY MOUTH DAILY     Cardiovascular: Diuretics - Thiazide Failed - 08/09/2023 10:31 PM      Failed - Cr in normal range and within 180 days    Creat  Date Value Ref Range Status  09/06/2022 0.99 0.60 - 1.00 mg/dL Final         Failed - K in normal range and within 180 days    Potassium  Date Value Ref Range Status  09/06/2022 4.0 3.5 - 5.3 mmol/L Final  10/29/2013 3.6 3.5 - 5.1 mmol/L Final         Failed - Na in normal range and within 180 days    Sodium  Date Value Ref Range Status  09/06/2022 139 135 - 146 mmol/L Final  08/30/2021 141 134 - 144 mmol/L Final  10/29/2013 140 136 - 145 mmol/L Final         Passed - Last BP in normal range    BP Readings from Last 1 Encounters:  07/15/23 118/67         Passed - Valid encounter within last 6 months    Recent Outpatient Visits           3 months ago Essential hypertension   [facility] Smitty Cords, DO   11 months ago Annual physical exam   [facility] Smitty Cords, DO   1 year ago Left medial knee pain   [facility] Smitty Cords, DO   1 year ago Annual physical exam   [facility] Smitty Cords, DO   3 years ago Annual physical exam   [facility] Smitty Cords, DO       Future Appointments             In 1 month Althea Charon, Netta Neat, DO  [facility], [facility]

## 2023-08-16 ENCOUNTER — Ambulatory Visit (INDEPENDENT_AMBULATORY_CARE_PROVIDER_SITE_OTHER): Payer: Medicare Other

## 2023-08-16 VITALS — BP 128/60 | Ht 65.5 in | Wt 196.8 lb

## 2023-08-16 DIAGNOSIS — Z0000 Encounter for general adult medical examination without abnormal findings: Secondary | ICD-10-CM | POA: Diagnosis not present

## 2023-08-16 DIAGNOSIS — Z23 Encounter for immunization: Secondary | ICD-10-CM | POA: Diagnosis not present

## 2023-08-16 NOTE — Progress Notes (Signed)
Subjective:   Jillian Fritz is a 72 y.o. female who presents for Medicare Annual (Subsequent) preventive examination.  Visit Complete: In person   Cardiac Risk Factors include: advanced age (>67men, >47 women);dyslipidemia;hypertension;obesity (BMI >30kg/m2)     Objective:    Today's Vitals   08/16/23 1000  BP: 128/60  Weight: 196 lb 12.8 oz (89.3 kg)  Height: 5' 5.5" (1.664 m)   Body mass index is 32.25 kg/m.     08/16/2023   10:07 AM 07/15/2023    8:28 AM 08/10/2022   10:06 AM 08/08/2021   10:23 AM 04/14/2020    7:34 AM 10/14/2018    1:54 PM 10/12/2018   12:52 PM  Advanced Directives  Does Patient Have a Medical Advance Directive? No Yes No Yes Yes Yes Yes  Type of Advance Directive  Living will  Healthcare Power of [place];Living will Healthcare Power of [place];Living will Healthcare Power of [place];Living will Healthcare Power of [place];Living will  Does patient want to make changes to medical advance directive?     No - Patient declined    Copy of Healthcare Power of Attorney in Chart?    No - copy requested No - copy requested    Would patient like information on creating a medical advance directive? No - Patient declined  No - Patient declined        Current Medications (verified) Outpatient Encounter Medications as of 08/16/2023  Medication Sig   aspirin EC 81 MG tablet Take 1 tablet (81 mg total) by mouth daily.   Cholecalciferol (VITAMIN D3) 50 MCG (2000 UT) TABS Take by mouth.   Coenzyme Q10 (COQ10) 100 MG CAPS Take by mouth.   ezetimibe (ZETIA) 10 MG tablet TAKE 1 TABLET BY MOUTH DAILY   hydrochlorothiazide (HYDRODIURIL) 12.5 MG tablet TAKE 1 TABLET BY MOUTH DAILY   ibuprofen (ADVIL) 800 MG tablet    levothyroxine (SYNTHROID) 88 MCG tablet Take 1 tablet (88 mcg total) by mouth daily before breakfast.   losartan (COZAAR) 50 MG tablet TAKE 1 TABLET BY MOUTH  DAILY   pantoprazole (PROTONIX) 40 MG tablet TAKE 1 TABLET BY MOUTH DAILY  BEFORE BREAKFAST    Probiotic Product (PROBIOTIC DAILY PO) Take by mouth daily.   rosuvastatin (CRESTOR) 5 MG tablet TAKE 1 TABLET BY MOUTH 3 TIMES  WEEKLY   No facility-administered encounter medications on file as of 08/16/2023.    Allergies (verified) Penicillins, Sulfa antibiotics, Statins, Dill oil, Lisinopril, Peach [prunus persica], Strawberry (diagnostic), and Propofol   History: Past Medical History:  Diagnosis Date   Arthritis    knuckles   Asthma    childhood - exercise induced   Dental bridge present    flexible, removable, upper, fits snuggly   GERD (gastroesophageal reflux disease)    Heart murmur    states she has 2 murmurs/no issues except when pregnant   Hypertension    controlled on meds   Hypothyroidism    Migraines    2 or 3 years ago   Motion sickness    cars, planes   Obesity    Sensorineural hearing loss    Shortness of breath dyspnea    Wears contact lenses    Past Surgical History:  Procedure Laterality Date   CERVICAL CONE BIOPSY     COLONOSCOPY WITH PROPOFOL N/A 04/14/2020   Procedure: COLONOSCOPY WITH BIOPSY;  Surgeon: Midge Minium, MD;  [facility];  Service: Endoscopy;  Laterality: N/A;  priority 3   COLONOSCOPY WITH PROPOFOL N/A  07/15/2023   Procedure: COLONOSCOPY WITH PROPOFOL;  Surgeon: Midge Minium, MD;  Location: [facility];  Service: Endoscopy;  Laterality: N/A;   ESOPHAGOGASTRODUODENOSCOPY (EGD) WITH PROPOFOL N/A 03/23/2016   Procedure: ESOPHAGOGASTRODUODENOSCOPY (EGD) WITH esophageal dilation. ;  Surgeon: Midge Minium, MD;  Location: [facility];  Service: Endoscopy;  Laterality: N/A;   POLYPECTOMY N/A 04/14/2020   Procedure: POLYPECTOMY;  Surgeon: Midge Minium, MD;  Location: [facility];  Service: Endoscopy;  Laterality: N/A;  2 Clips placed at [facility] site   THROAT SURGERY  04/2014   Family History  Problem Relation Age of Onset   Alzheimer's disease Father    Hypertension Mother    Breast cancer  Paternal Aunt        3 pat aunts   Thyroid disease Sister    Social History   Socioeconomic History   Marital status: Single    Spouse name: Not on file   Number of children: Not on file   Years of education: Not on file   Highest education level: Not on file  Occupational History   Occupation: [employer]  Tobacco Use   Smoking status: Never   Smokeless tobacco: Never  Vaping Use   Vaping status: Never Used  Substance and Sexual Activity   Alcohol use: No    Alcohol/week: 0.0 standard drinks of alcohol   Drug use: No   Sexual activity: Not Currently  Other Topics Concern   Not on file  Social History Narrative   Not on file   Social Determinants of Health   Financial Resource Strain: Low Risk  (08/16/2023)   Overall Financial Resource Strain (CARDIA)    Difficulty of Paying Living Expenses: Not hard at all  Food Insecurity: No Food Insecurity (08/16/2023)   Hunger Vital Sign    Worried About Running Out of Food in the Last Year: Never true    Ran Out of Food in the Last Year: Never true  Transportation Needs: No Transportation Needs (08/16/2023)   PRAPARE - Administrator, Civil Service (Medical): No    Lack of Transportation (Non-Medical): No  Physical Activity: Sufficiently Active (08/16/2023)   Exercise Vital Sign    Days of Exercise per Week: 3 days    Minutes of Exercise per Session: 50 min  Stress: No Stress Concern Present (08/16/2023)   [employer] of Occupational Health - Occupational Stress Questionnaire    Feeling of Stress : Only a little  Social Connections: Moderately Isolated (08/16/2023)   Social Connection and Isolation Panel [NHANES]    Frequency of Communication with Friends and Family: More than three times a week    Frequency of Social Gatherings with Friends and Family: More than three times a week    Attends Religious Services: More than 4 times per year    Active Member of [employer] or Organizations: No    Attends Occupational hygienist Meetings: Never    Marital Status: Widowed    Tobacco Counseling Counseling given: Not Answered   Clinical Intake:  Pre-visit preparation completed: Yes  Pain : No/denies pain     Nutritional Status: BMI > 30  Obese Nutritional Risks: None Diabetes: No  How often do you need to have someone help you when you read instructions, pamphlets, or other written materials from your doctor or pharmacy?: 1 - Never  Interpreter Needed?: No  Information entered by :: Kennedy Bucker, LPN   Activities of Daily Living    08/16/2023  10:07 AM 07/15/2023    8:28 AM  In your present state of health, do you have any difficulty performing the following activities:  Hearing? 0 0  Vision? 0 0  Difficulty concentrating or making decisions? 0 0  Walking or climbing stairs? 1 0  Comment knee pain   Dressing or bathing? 0 0  Doing errands, shopping? 0   Preparing Food and eating ? N   Using the Toilet? N   In the past six months, have you accidently leaked urine? N   Do you have problems with loss of bowel control? N   Managing your Medications? N   Managing your Finances? N   Housekeeping or managing your Housekeeping? N     Patient Care Team: Smitty Cords, DO as PCP - General (Family Medicine)  Indicate any recent Medical Services you may have received from other than [facility] providers in the past year (date may be approximate).     Assessment:   This is a routine wellness examination for [place].  Hearing/Vision screen Hearing Screening - Comments:: No aids Vision Screening - Comments:: Wears contacts- Dr.Woodard   Goals Addressed             This Visit's Progress    DIET - INCREASE WATER INTAKE         Depression Screen    08/16/2023   10:06 AM 05/14/2023   10:37 AM 09/13/2022   10:11 AM 08/10/2022   10:04 AM 11/10/2021    2:19 PM 08/08/2021   10:24 AM 08/11/2020   10:04 AM  PHQ 2/9 Scores  PHQ - 2 Score 0 0 0 0 0 0 0  PHQ- 9 Score 0 0 0 0 0       Fall Risk    08/16/2023   10:07 AM 05/14/2023   10:37 AM 09/13/2022   10:11 AM 08/10/2022   10:07 AM 11/10/2021    2:19 PM  Fall Risk   Falls in the past year? 0 0 1 0 0  Number falls in past yr: 0 0 0 0 0  Injury with Fall? 0 0 1 0 0  Risk for fall due to : No Fall Risks No Fall Risks No Fall Risks No Fall Risks   Follow up Falls prevention discussed;Falls evaluation completed Falls evaluation completed Falls evaluation completed Falls evaluation completed Falls evaluation completed    MEDICARE RISK AT HOME: Medicare Risk at Home Any stairs in or around the home?: Yes If so, are there any without handrails?: No Home free of loose throw rugs in walkways, pet beds, electrical cords, etc?: Yes Adequate lighting in your home to reduce risk of falls?: Yes Life alert?: No Use of a cane, walker or w/c?: No Grab bars in the bathroom?: No Shower chair or bench in shower?: Yes Elevated toilet seat or a handicapped toilet?: No  TIMED UP AND GO:  Was the test performed?  Yes  Length of time to ambulate 10 feet: 4 sec Gait steady and fast without use of assistive device    Cognitive Function:        08/16/2023   10:08 AM 08/10/2022   10:13 AM 08/08/2021   10:25 AM  6CIT Screen  What Year? 0 points 0 points 0 points  What month? 0 points 0 points 0 points  What time? 0 points 0 points 0 points  Count back from 20 0 points 0 points 0 points  Months in reverse 0 points 0 points 0 points  Repeat phrase 0 points 0 points 2 points  Total Score 0 points 0 points 2 points    Immunizations Immunization History  Administered Date(s) Administered   Fluad Quad(high Dose 65+) 08/07/2019, 08/11/2020, 09/08/2021, 09/13/2022   Influenza-Unspecified 08/13/2018   PFIZER(Purple Top)SARS-COV-2 Vaccination 02/03/2020, 02/24/2020, 11/10/2020   Pneumococcal Conjugate-13 04/26/2017   Pneumococcal Polysaccharide-23 07/30/2018   Zoster Recombinant(Shingrix) 08/30/2017, 12/20/2017    TDAP  status: Due, Education has been provided regarding the importance of this vaccine. Advised may receive this vaccine at local pharmacy or Health Dept. Aware to provide a copy of the vaccination record if obtained from local pharmacy or Health Dept. Verbalized acceptance and understanding.  Flu Vaccine status: Completed at today's visit  Pneumococcal vaccine status: Up to date  Covid-19 vaccine status: Completed vaccines  Qualifies for Shingles Vaccine? Yes   Zostavax completed No   Shingrix Completed?: No.    Education has been provided regarding the importance of this vaccine. Patient has been advised to call insurance company to determine out of pocket expense if they have not yet received this vaccine. Advised may also receive vaccine at local pharmacy or Health Dept. Verbalized acceptance and understanding.  Screening Tests Health Maintenance  Topic Date Due   DTaP/Tdap/Td (1 - Tdap) Never done   INFLUENZA VACCINE  07/04/2023   COVID-19 Vaccine (4 - 2023-24 season) 08/04/2023   MAMMOGRAM  09/18/2023   Medicare Annual Wellness (AWV)  08/15/2024   Colonoscopy  07/14/2026   Pneumonia Vaccine 67+ Years old  Completed   DEXA SCAN  Completed   Hepatitis C Screening  Completed   Zoster Vaccines- Shingrix  Completed   HPV VACCINES  Aged Out   Fecal DNA (Cologuard)  Discontinued    Health Maintenance  Health Maintenance Due  Topic Date Due   DTaP/Tdap/Td (1 - Tdap) Never done   INFLUENZA VACCINE  07/04/2023   COVID-19 Vaccine (4 - 2023-24 season) 08/04/2023    Colorectal cancer screening: Type of screening: Colonoscopy. Completed 07/15/23. Repeat every 3 years  Mammogram status: Completed 09/17/22. Repeat every year- wants to skip a year for mammogram  Bone Density status: Completed 09/17/22. Results reflect: Bone density results: OSTEOPENIA. Repeat every 5 years.  Lung Cancer Screening: (Low Dose CT Chest recommended if Age 64-80 years, 20 pack-year currently smoking OR have  quit w/in 15years.) does not qualify.   Additional Screening:  Hepatitis C Screening: does qualify; Completed 02/13/16  Vision Screening: Recommended annual ophthalmology exams for early detection of glaucoma and other disorders of the eye. Is the patient up to date with their annual eye exam?  Yes  Who is the provider or what is the name of the office in which the patient attends annual eye exams? Dr.King in [place] If pt is not established with a provider, would they like to be referred to a provider to establish care? No .   Dental Screening: Recommended annual dental exams for proper oral hygiene   Community Resource Referral / Chronic Care Management: CRR required this visit?  No   CCM required this visit?  No     Plan:     I have personally reviewed and noted the following in the patient's chart:   Medical and social history Use of alcohol, tobacco or illicit drugs  Current medications and supplements including opioid prescriptions. Patient is not currently taking opioid prescriptions. Functional ability and status Nutritional status Physical activity Advanced directives List of other physicians Hospitalizations, surgeries, and ER visits in previous 12 months Vitals  Screenings to include cognitive, depression, and falls Referrals and appointments  In addition, I have reviewed and discussed with patient certain preventive protocols, quality metrics, and best practice recommendations. A written personalized care plan for preventive services as well as general preventive health recommendations were provided to patient.     Hal Hope, LPN   1/61/0960   After Visit Summary: my chart  Nurse Notes: none

## 2023-08-16 NOTE — Patient Instructions (Addendum)
Jillian Fritz , Thank you for taking time to come for your Medicare Wellness Visit. I appreciate your ongoing commitment to your health goals. Please review the following plan we discussed and let me know if I can assist you in the future.   Referrals/Orders/Follow-Ups/Clinician Recommendations: none  This is a list of the screening recommended for you and due dates:  Health Maintenance  Topic Date Due   DTaP/Tdap/Td vaccine (1 - Tdap) Never done   Flu Shot  07/04/2023   COVID-19 Vaccine (4 - 2023-24 season) 08/04/2023   Mammogram  09/18/2023   Medicare Annual Wellness Visit  08/15/2024   Colon Cancer Screening  07/14/2026   Pneumonia Vaccine  Completed   DEXA scan (bone density measurement)  Completed   Hepatitis C Screening  Completed   Zoster (Shingles) Vaccine  Completed   HPV Vaccine  Aged Out   Cologuard (Stool DNA test)  Discontinued    Advanced directives: (ACP Link)Information on Advanced Care Planning can be found at [facility] Advance Health Care Directives Advance Health Care Directives ([email or web address]   Next Medicare Annual Wellness Visit scheduled for next year: Yes   08/21/24 @ 9:15 am in person

## 2023-08-25 ENCOUNTER — Other Ambulatory Visit: Payer: Self-pay | Admitting: Family Medicine

## 2023-08-25 DIAGNOSIS — I129 Hypertensive chronic kidney disease with stage 1 through stage 4 chronic kidney disease, or unspecified chronic kidney disease: Secondary | ICD-10-CM

## 2023-08-27 NOTE — Telephone Encounter (Signed)
Appointment 10/10/23 Requested Prescriptions  Pending Prescriptions Disp Refills   losartan (COZAAR) 50 MG tablet [Pharmacy Med Name: Losartan Potassium 50 MG Oral Tablet] 100 tablet 2    Sig: TAKE 1 TABLET BY MOUTH DAILY     Cardiovascular:  Angiotensin Receptor Blockers Failed - 08/25/2023 10:44 PM      Failed - Cr in normal range and within 180 days    Creat  Date Value Ref Range Status  09/06/2022 0.99 0.60 - 1.00 mg/dL Final         Failed - K in normal range and within 180 days    Potassium  Date Value Ref Range Status  09/06/2022 4.0 3.5 - 5.3 mmol/L Final  10/29/2013 3.6 3.5 - 5.1 mmol/L Final         Passed - Patient is not pregnant      Passed - Last BP in normal range    BP Readings from Last 1 Encounters:  08/16/23 128/60         Passed - Valid encounter within last 6 months    Recent Outpatient Visits           3 months ago Essential hypertension   [place] [facility] Smitty Cords, DO   11 months ago Annual physical exam   [place] [facility] Smitty Cords, DO   1 year ago Left medial knee pain   [place] [facility] Smitty Cords, DO   1 year ago Annual physical exam   [place] [facility] Smitty Cords, DO   3 years ago Annual physical exam   [place] [facility] Smitty Cords, DO       Future Appointments             In 1 month Althea Charon, Netta Neat, DO [place] [facility], [facility]

## 2023-09-05 ENCOUNTER — Other Ambulatory Visit: Payer: Medicare Other

## 2023-09-13 ENCOUNTER — Encounter: Payer: Medicare Other | Admitting: Family Medicine

## 2023-09-16 ENCOUNTER — Other Ambulatory Visit: Payer: Medicare Other

## 2023-09-19 ENCOUNTER — Other Ambulatory Visit: Payer: Self-pay | Admitting: Family Medicine

## 2023-09-19 DIAGNOSIS — K222 Esophageal obstruction: Secondary | ICD-10-CM

## 2023-09-19 DIAGNOSIS — L03012 Cellulitis of left finger: Secondary | ICD-10-CM | POA: Diagnosis not present

## 2023-09-19 DIAGNOSIS — E782 Mixed hyperlipidemia: Secondary | ICD-10-CM

## 2023-09-19 NOTE — Telephone Encounter (Signed)
Requested medication (s) are due for refill today: yes  Requested medication (s) are on the active medication list: yes  Last refill:  01/28/23 #43 2 RF  Future visit scheduled:yes  Notes to clinic:  Lab work out of date   Requested Prescriptions  Pending Prescriptions Disp Refills   rosuvastatin (CRESTOR) 5 MG tablet [Pharmacy Med Name: Rosuvastatin Calcium 5 MG Oral Tablet] 43 tablet     Sig: TAKE 1 TABLET BY MOUTH 3 TIMES  WEEKLY     Cardiovascular:  Antilipid - Statins 2 Failed - 09/19/2023  4:58 AM      Failed - Cr in normal range and within 360 days    Creat  Date Value Ref Range Status  09/06/2022 0.99 0.60 - 1.00 mg/dL Final         Failed - Lipid Panel in normal range within the last 12 months    Cholesterol, Total  Date Value Ref Range Status  08/30/2021 179 100 - 199 mg/dL Final   Cholesterol  Date Value Ref Range Status  09/06/2022 201 (H) <200 mg/dL Final   LDL Cholesterol (Calc)  Date Value Ref Range Status  09/06/2022 110 (H) mg/dL (calc) Final    Comment:    Reference range: <100 . Desirable range <100 mg/dL for primary prevention;   <70 mg/dL for patients with CHD or diabetic patients  with > or = 2 CHD risk factors. Marland Kitchen LDL-C is now calculated using the Martin-Hopkins  calculation, which is a validated novel method providing  better accuracy than the Friedewald equation in the  estimation of LDL-C.  Horald Pollen et al. Lenox Ahr. 6045;409(81): 2061-2068  (http://education.Quest[employer].com/faq/FAQ164)    HDL  Date Value Ref Range Status  09/06/2022 71 > OR = 50 mg/dL Final  19/14/7829 70 >56 mg/dL Final   Triglycerides  Date Value Ref Range Status  09/06/2022 94 <150 mg/dL Final         Passed - Patient is not pregnant      Passed - Valid encounter within last 12 months    Recent Outpatient Visits           4 months ago Essential hypertension   [place] [facility] Althea Charon, Netta Neat, DO   1 year ago Annual  physical exam   [place] [facility] Smitty Cords, DO   1 year ago Left medial knee pain   [place] [facility] Smitty Cords, DO   2 years ago Annual physical exam   [place] [facility] Smitty Cords, DO   3 years ago Annual physical exam   [place] [facility] Smitty Cords, DO       Future Appointments             In 3 weeks Althea Charon, Netta Neat, DO [place] [facility], [place]            Signed Prescriptions Disp Refills   pantoprazole (PROTONIX) 40 MG tablet 100 tablet 2    Sig: TAKE 1 TABLET BY MOUTH DAILY  BEFORE BREAKFAST     Gastroenterology: Proton Pump Inhibitors Passed - 09/19/2023  4:58 AM      Passed - Valid encounter within last 12 months    Recent Outpatient Visits           4 months ago Essential hypertension   [place] [facility] [place], Netta Neat, DO   1  year ago Annual physical exam   [facility] Smitty Cords, DO   1 year ago Left medial knee pain   [place] [facility] Smitty Cords, DO   2 years ago Annual physical exam   [place] [facility] Smitty Cords, DO   3 years ago Annual physical exam   [place] [facility] Smitty Cords, DO       Future Appointments             In 3 weeks Althea Charon, Netta Neat, DO [place] [facility], [facility]

## 2023-09-19 NOTE — Telephone Encounter (Signed)
Requested Prescriptions  Pending Prescriptions Disp Refills   rosuvastatin (CRESTOR) 5 MG tablet [Pharmacy Med Name: Rosuvastatin Calcium 5 MG Oral Tablet] 43 tablet     Sig: TAKE 1 TABLET BY MOUTH 3 TIMES  WEEKLY     Cardiovascular:  Antilipid - Statins 2 Failed - 09/19/2023  4:58 AM      Failed - Cr in normal range and within 360 days    Creat  Date Value Ref Range Status  09/06/2022 0.99 0.60 - 1.00 mg/dL Final         Failed - Lipid Panel in normal range within the last 12 months    Cholesterol, Total  Date Value Ref Range Status  08/30/2021 179 100 - 199 mg/dL Final   Cholesterol  Date Value Ref Range Status  09/06/2022 201 (H) <200 mg/dL Final   LDL Cholesterol (Calc)  Date Value Ref Range Status  09/06/2022 110 (H) mg/dL (calc) Final    Comment:    Reference range: <100 . Desirable range <100 mg/dL for primary prevention;   <70 mg/dL for patients with CHD or diabetic patients  with > or = 2 CHD risk factors. Marland Kitchen LDL-C is now calculated using the Martin-Hopkins  calculation, which is a validated novel method providing  better accuracy than the Friedewald equation in the  estimation of LDL-C.  Horald Pollen et al. Lenox Ahr. 1610;960(45): 2061-2068  (http://education.[facility].com/faq/FAQ164)    HDL  Date Value Ref Range Status  09/06/2022 71 > OR = 50 mg/dL Final  40/98/1191 70 >47 mg/dL Final   Triglycerides  Date Value Ref Range Status  09/06/2022 94 <150 mg/dL Final         Passed - Patient is not pregnant      Passed - Valid encounter within last 12 months    Recent Outpatient Visits           4 months ago Essential hypertension   [place] [facility] Althea Charon, Netta Neat, DO   1 year ago Annual physical exam   [place] [facility] Smitty Cords, DO   1 year ago Left medial knee pain   [place] [facility] Smitty Cords, DO   2 years ago Annual physical exam   [facility] Smitty Cords, DO   3 years ago Annual physical exam   [place] [facility] Althea Charon, Netta Neat, DO       Future Appointments             In 3 weeks Althea Charon, Netta Neat, DO [place] [facility]             pantoprazole (PROTONIX) 40 MG tablet [Pharmacy Med Name: Pantoprazole Sodium 40 MG Oral Tablet Delayed Release] 100 tablet 2    Sig: TAKE 1 TABLET BY MOUTH DAILY  BEFORE BREAKFAST     Gastroenterology: Proton Pump Inhibitors Passed - 09/19/2023  4:58 AM      Passed - Valid encounter within last 12 months    Recent Outpatient Visits           4 months ago Essential hypertension   [place] [facility] Smitty Cords, DO   1 year ago Annual physical exam   [place] [facility] Smitty Cords, DO   1 year ago Left medial knee pain    [facility] [place], Netta Neat, [place]  2 years ago Annual physical exam   [place] [facility] Smitty Cords, DO   3 years ago Annual physical exam   [place] [facility] Althea Charon, Netta Neat, DO       Future Appointments             In 3 weeks Althea Charon, Netta Neat, DO [place] [facility]

## 2023-09-23 ENCOUNTER — Encounter: Payer: Medicare Other | Admitting: Family Medicine

## 2023-10-01 DIAGNOSIS — R2232 Localized swelling, mass and lump, left upper limb: Secondary | ICD-10-CM | POA: Diagnosis not present

## 2023-10-01 DIAGNOSIS — M13842 Other specified arthritis, left hand: Secondary | ICD-10-CM | POA: Diagnosis not present

## 2023-10-01 DIAGNOSIS — Z7982 Long term (current) use of aspirin: Secondary | ICD-10-CM | POA: Diagnosis not present

## 2023-10-01 DIAGNOSIS — M79645 Pain in left finger(s): Secondary | ICD-10-CM | POA: Diagnosis not present

## 2023-10-01 DIAGNOSIS — L03012 Cellulitis of left finger: Secondary | ICD-10-CM | POA: Diagnosis not present

## 2023-10-10 ENCOUNTER — Ambulatory Visit (INDEPENDENT_AMBULATORY_CARE_PROVIDER_SITE_OTHER): Payer: Medicare Other | Admitting: Family Medicine

## 2023-10-10 ENCOUNTER — Encounter: Payer: Self-pay | Admitting: Family Medicine

## 2023-10-10 VITALS — BP 134/60 | HR 66 | Ht 63.5 in | Wt 189.0 lb

## 2023-10-10 DIAGNOSIS — I129 Hypertensive chronic kidney disease with stage 1 through stage 4 chronic kidney disease, or unspecified chronic kidney disease: Secondary | ICD-10-CM

## 2023-10-10 DIAGNOSIS — K080 Exfoliation of teeth due to systemic causes: Secondary | ICD-10-CM | POA: Diagnosis not present

## 2023-10-10 DIAGNOSIS — R7309 Other abnormal glucose: Secondary | ICD-10-CM

## 2023-10-10 DIAGNOSIS — E034 Atrophy of thyroid (acquired): Secondary | ICD-10-CM | POA: Diagnosis not present

## 2023-10-10 DIAGNOSIS — E782 Mixed hyperlipidemia: Secondary | ICD-10-CM

## 2023-10-10 DIAGNOSIS — E66811 Obesity, class 1: Secondary | ICD-10-CM

## 2023-10-10 DIAGNOSIS — I10 Essential (primary) hypertension: Secondary | ICD-10-CM

## 2023-10-10 DIAGNOSIS — N182 Chronic kidney disease, stage 2 (mild): Secondary | ICD-10-CM

## 2023-10-10 DIAGNOSIS — E559 Vitamin D deficiency, unspecified: Secondary | ICD-10-CM | POA: Diagnosis not present

## 2023-10-10 DIAGNOSIS — Z0000 Encounter for general adult medical examination without abnormal findings: Secondary | ICD-10-CM

## 2023-10-10 DIAGNOSIS — Z1231 Encounter for screening mammogram for malignant neoplasm of breast: Secondary | ICD-10-CM

## 2023-10-10 NOTE — Progress Notes (Signed)
Subjective:    Patient ID: Jillian Fritz, female    DOB: January 02, 1951, 72 y.o.   MRN: [id number]  Jillian Fritz is a 72 y.o. female presenting on 10/10/2023 for Annual Exam  Note she has relocated to [place]. She will look for new PCP in that area.  HPI  Here for Annual Physical and Lab Orders (Non fasting)  Discussed the use of AI scribe software for clinical note transcription with the patient, who gave verbal consent to proceed.     CHRONIC HTN - Today patient reports still overall doing well with home BP readings controlled Asking about adjusting med Current Meds - Losartan 50mg  daily, HCTZ 12.5mg  daily Tolerating well, w/o complaints. - Still improved edema Not taking NSAIDs   GERD Previously controlled. had esophageal dilatation in past  Left Middle Finger - distal cyst Already had antibiotics without success. Seems to be gradual improved but not resolving. Ortho advise may require drainage    History of Heart Murmur   Elevated A1c Last lab A1c 5.5 (09/2022) - due for repeat lab Meds: not on meds Currently on ARB Limited regular exercise, plans to improve this in future when retire.   HYPERLIPIDEMIA: prior elevated LDL and total cholesterol, previously improved on meds Significant improvement now with improved diet lifestyle, wt loss and GoLow, walking more - taking Zetia 10 and Rosuva 5mg  intermittent Due for lab   Hypothyroidism: Last labs 09/2022 TSH and Free T4 normal Continues on Levothyroxine daily - No significant symptoms   Osteopenia, spine / Vitamin D Deficiency Vitamin D up to 65 On supplement   Health Maintenance:   Last Colonoscopy 07/15/23  Dr Servando Snare - no polyps found, repeat 10 years, optional age 10+       10/10/2023    9:42 AM 08/16/2023   10:06 AM 05/14/2023   10:37 AM  Depression screen PHQ 2/9  Decreased Interest 0 0 0  Down, Depressed, Hopeless 0 0 0  PHQ - 2 Score 0 0 0  Altered sleeping 0 0 0  Tired,  decreased energy 0 0 0  Change in appetite 0 0 0  Feeling bad or failure about yourself  0 0 0  Trouble concentrating 0 0 0  Moving slowly or fidgety/restless 0 0 0  Suicidal thoughts 0 0 0  PHQ-9 Score 0 0 0  Difficult doing work/chores Not difficult at all Not difficult at all Not difficult at all    Past Medical History:  Diagnosis Date   Arthritis    knuckles   Asthma    childhood - exercise induced   Dental bridge present    flexible, removable, upper, fits snuggly   GERD (gastroesophageal reflux disease)    Heart murmur    states she has 2 murmurs/no issues except when pregnant   Hypertension    controlled on meds   Hypothyroidism    Migraines    2 or 3 years ago   Motion sickness    cars, planes   Obesity    Sensorineural hearing loss    Shortness of breath dyspnea    Wears contact lenses    Past Surgical History:  Procedure Laterality Date   CERVICAL CONE BIOPSY     COLONOSCOPY WITH PROPOFOL N/A 04/14/2020   Procedure: COLONOSCOPY WITH BIOPSY;  Surgeon: Midge Minium, MD;  Location: [facility];  Service: Endoscopy;  Laterality: N/A;  priority 3   COLONOSCOPY WITH PROPOFOL N/A 07/15/2023   Procedure: COLONOSCOPY WITH PROPOFOL;  Surgeon:  Midge Minium, MD;  Location: [facility];  Service: Endoscopy;  Laterality: N/A;   ESOPHAGOGASTRODUODENOSCOPY (EGD) WITH PROPOFOL N/A 03/23/2016   Procedure: ESOPHAGOGASTRODUODENOSCOPY (EGD) WITH esophageal dilation. ;  Surgeon: Midge Minium, MD;  Location: [facility];  Service: Endoscopy;  Laterality: N/A;   POLYPECTOMY N/A 04/14/2020   Procedure: POLYPECTOMY;  Surgeon: Midge Minium, MD;  Location: [facility];  Service: Endoscopy;  Laterality: N/A;  2 Clips placed at [facility] Polyp site   THROAT SURGERY  04/2014   Social History   Socioeconomic History   Marital status: Single    Spouse name: Not on file   Number of children: Not on file   Years of education: Not on file   Highest  education level: Not on file  Occupational History   Occupation: [employer]  Tobacco Use   Smoking status: Never   Smokeless tobacco: Never  Vaping Use   Vaping status: Never Used  Substance and Sexual Activity   Alcohol use: No    Alcohol/week: 0.0 standard drinks of alcohol   Drug use: No   Sexual activity: Not Currently  Other Topics Concern   Not on file  Social History Narrative   Not on file   Social Determinants of Health   Financial Resource Strain: Low Risk  (08/16/2023)   Overall Financial Resource Strain (CARDIA)    Difficulty of Paying Living Expenses: Not hard at all  Food Insecurity: No Food Insecurity (08/16/2023)   Hunger Vital Sign    Worried About Running Out of Food in the Last Year: Never true    Ran Out of Food in the Last Year: Never true  Transportation Needs: No Transportation Needs (08/16/2023)   PRAPARE - Administrator, Civil Service (Medical): No    Lack of Transportation (Non-Medical): No  Physical Activity: Sufficiently Active (08/16/2023)   Exercise Vital Sign    Days of Exercise per Week: 3 days    Minutes of Exercise per Session: 50 min  Stress: No Stress Concern Present (08/16/2023)   [employer] of Occupational Health - Occupational Stress Questionnaire    Feeling of Stress : Only a little  Social Connections: Moderately Isolated (08/16/2023)   Social Connection and Isolation Panel [NHANES]    Frequency of Communication with Friends and Family: More than three times a week    Frequency of Social Gatherings with Friends and Family: More than three times a week    Attends Religious Services: More than 4 times per year    Active Member of [employer] or Organizations: No    Attends Banker Meetings: Never    Marital Status: Widowed  Intimate Partner Violence: Not At Risk (08/16/2023)   Humiliation, Afraid, Rape, and Kick questionnaire    Fear of Current or Ex-Partner: No    Emotionally Abused: No     Physically Abused: No    Sexually Abused: No   Family History  Problem Relation Age of Onset   Alzheimer's disease Father    Hypertension Mother    Breast cancer Paternal Aunt        3 pat aunts   Thyroid disease Sister    Current Outpatient Medications on File Prior to Visit  Medication Sig   aspirin EC 81 MG tablet Take 1 tablet (81 mg total) by mouth daily.   Cholecalciferol (VITAMIN D3) 50 MCG (2000 UT) TABS Take by mouth.   Coenzyme Q10 (COQ10) 100 MG CAPS Take by mouth.   ezetimibe (ZETIA)  10 MG tablet TAKE 1 TABLET BY MOUTH DAILY   hydrochlorothiazide (HYDRODIURIL) 12.5 MG tablet TAKE 1 TABLET BY MOUTH DAILY   ibuprofen (ADVIL) 800 MG tablet    levothyroxine (SYNTHROID) 88 MCG tablet Take 1 tablet (88 mcg total) by mouth daily before breakfast.   losartan (COZAAR) 50 MG tablet TAKE 1 TABLET BY MOUTH DAILY   pantoprazole (PROTONIX) 40 MG tablet TAKE 1 TABLET BY MOUTH DAILY  BEFORE BREAKFAST   Probiotic Product (PROBIOTIC DAILY PO) Take by mouth daily.   rosuvastatin (CRESTOR) 5 MG tablet TAKE 1 TABLET BY MOUTH 3 TIMES  WEEKLY   No current facility-administered medications on file prior to visit.    Review of Systems  Constitutional:  Negative for activity change, appetite change, chills, diaphoresis, fatigue and fever.  HENT:  Negative for congestion and hearing loss.   Eyes:  Negative for visual disturbance.  Respiratory:  Negative for cough, chest tightness, shortness of breath and wheezing.   Cardiovascular:  Negative for chest pain, palpitations and leg swelling.  Gastrointestinal:  Negative for abdominal pain, constipation, diarrhea, nausea and vomiting.  Genitourinary:  Negative for dysuria, frequency and hematuria.  Musculoskeletal:  Negative for arthralgias and neck pain.  Skin:  Negative for rash.  Neurological:  Negative for dizziness, weakness, light-headedness, numbness and headaches.  Hematological:  Negative for adenopathy.  Psychiatric/Behavioral:   Negative for behavioral problems, dysphoric mood and sleep disturbance.    Per HPI unless specifically indicated above     Objective:    BP 134/60   Pulse 66   Ht 5' 3.5" (1.613 m)   Wt 189 lb (85.7 kg)   SpO2 96%   BMI 32.95 kg/m   Wt Readings from Last 3 Encounters:  10/10/23 189 lb (85.7 kg)  08/16/23 196 lb 12.8 oz (89.3 kg)  07/15/23 196 lb 9.6 oz (89.2 kg)    Physical Exam Vitals and nursing note reviewed.  Constitutional:      General: She is not in acute distress.    Appearance: She is well-developed. She is not diaphoretic.     Comments: Well-appearing, comfortable, cooperative  HENT:     Head: Normocephalic and atraumatic.  Eyes:     General:        Right eye: No discharge.        Left eye: No discharge.     Conjunctiva/sclera: Conjunctivae normal.     Pupils: Pupils are equal, round, and reactive to light.  Neck:     Thyroid: No thyromegaly.     Vascular: No carotid bruit.  Cardiovascular:     Rate and Rhythm: Normal rate and regular rhythm.     Pulses: Normal pulses.     Heart sounds: Normal heart sounds. No murmur heard. Pulmonary:     Effort: Pulmonary effort is normal. No respiratory distress.     Breath sounds: Normal breath sounds. No wheezing or rales.  Abdominal:     General: Bowel sounds are normal. There is no distension.     Palpations: Abdomen is soft. There is no mass.     Tenderness: There is no abdominal tenderness.  Musculoskeletal:        General: No tenderness. Normal range of motion.     Cervical back: Normal range of motion and neck supple.     Right lower leg: Edema (trace) present.     Left lower leg: Edema (varicose veins) present.     Comments: Upper / Lower Extremities: - Normal muscle tone, strength bilateral upper  extremities 5/5, lower extremities 5/5  Lymphadenopathy:     Cervical: No cervical adenopathy.  Skin:    General: Skin is warm and dry.     Findings: No erythema or rash.     Comments: L middle finger distal  phalanx nailbed appears like healing paronychia. Seems resolving cyst but still present  Neurological:     Mental Status: She is alert and oriented to person, place, and time.     Comments: Distal sensation intact to light touch all extremities  Psychiatric:        Mood and Affect: Mood normal.        Behavior: Behavior normal.        Thought Content: Thought content normal.     Comments: Well groomed, good eye contact, normal speech and thoughts      Results for orders placed or performed in visit on 09/05/22  T4, free  Result Value Ref Range   Free T4 1.5 0.8 - 1.8 ng/dL  TSH  Result Value Ref Range   TSH 0.80 0.40 - 4.50 mIU/L  Vitamin D (25 hydroxy)  Result Value Ref Range   Vit D, 25-Hydroxy 65 30 - 100 ng/mL  Comprehensive Metabolic Panel (CMET)  Result Value Ref Range   Glucose, Bld 86 65 - 99 mg/dL   BUN 15 7 - 25 mg/dL   Creat 9.56 2.13 - 0.86 mg/dL   BUN/Creatinine Ratio SEE NOTE: 6 - 22 (calc)   Sodium 139 135 - 146 mmol/L   Potassium 4.0 3.5 - 5.3 mmol/L   Chloride 100 98 - 110 mmol/L   CO2 28 20 - 32 mmol/L   Calcium 9.6 8.6 - 10.4 mg/dL   Total Protein 6.6 6.1 - 8.1 g/dL   Albumin 4.3 3.6 - 5.1 g/dL   Globulin 2.3 1.9 - 3.7 g/dL (calc)   AG Ratio 1.9 1.0 - 2.5 (calc)   Total Bilirubin 0.9 0.2 - 1.2 mg/dL   Alkaline phosphatase (APISO) 84 37 - 153 U/L   AST 21 10 - 35 U/L   ALT 18 6 - 29 U/L  Lipid panel  Result Value Ref Range   Cholesterol 201 (H) <200 mg/dL   HDL 71 > OR = 50 mg/dL   Triglycerides 94 <578 mg/dL   LDL Cholesterol (Calc) 110 (H) mg/dL (calc)   Total CHOL/HDL Ratio 2.8 <5.0 (calc)   Non-HDL Cholesterol (Calc) 130 (H) <130 mg/dL (calc)  CBC with Differential  Result Value Ref Range   WBC 6.1 3.8 - 10.8 Thousand/uL   RBC 4.26 3.80 - 5.10 Million/uL   Hemoglobin 12.7 11.7 - 15.5 g/dL   HCT 46.9 62.9 - 52.8 %   MCV 90.6 80.0 - 100.0 fL   MCH 29.8 27.0 - 33.0 pg   MCHC 32.9 32.0 - 36.0 g/dL   RDW 41.3 24.4 - 01.0 %   Platelets 233  140 - 400 Thousand/uL   MPV 12.1 7.5 - 12.5 fL   Neutro Abs 2,898 1,500 - 7,800 cells/uL   Lymphs Abs 2,117 850 - 3,900 cells/uL   Absolute Monocytes 647 200 - 950 cells/uL   Eosinophils Absolute 360 15 - 500 cells/uL   Basophils Absolute 79 0 - 200 cells/uL   Neutrophils Relative % 47.5 %   Total Lymphocyte 34.7 %   Monocytes Relative 10.6 %   Eosinophils Relative 5.9 %   Basophils Relative 1.3 %  HgB A1c  Result Value Ref Range   Hgb A1c MFr Bld 5.5 <5.7 %  of total Hgb   Mean Plasma Glucose 111 mg/dL   eAG (mmol/L) 6.2 mmol/L      Assessment & Plan:   Problem List Items Addressed This Visit     Elevated hemoglobin A1c   Relevant Orders   Hemoglobin A1c   Essential hypertension   Relevant Orders   COMPLETE METABOLIC PANEL WITH GFR   CBC with Differential/Platelet   Hypothyroidism   Relevant Orders   TSH   T4, free   Mixed hyperlipidemia   Relevant Orders   TSH   T4, free   Obesity (BMI 30.0-34.9)   Vitamin D deficiency   Relevant Orders   VITAMIN D 25 Hydroxy (Vit-D Deficiency, Fractures)   Other Visit Diagnoses     Annual physical exam    -  Primary   Relevant Orders   Hemoglobin A1c   Lipid panel   COMPLETE METABOLIC PANEL WITH GFR   CBC with Differential/Platelet   TSH   Encounter for screening mammogram for malignant neoplasm of breast       Relevant Orders   MM 3D SCREENING MAMMOGRAM BILATERAL BREAST   Benign hypertension with CKD (chronic kidney disease), stage II       Relevant Orders   COMPLETE METABOLIC PANEL WITH GFR   CBC with Differential/Platelet       Updated Health Maintenance information Reviewed recent lab results with patient Encouraged improvement to lifestyle with diet and exercise Goal of weight loss  Hypertension Controlled on current medications  Elevated A1c Check lab today    Cyst on Left Middle Finger Followed by Ortho Likely secondary to arthritis, currently being managed conservatively. Discussed potential  surgical intervention and risks associated with it. -Continue conservative management. -Consider surgical intervention if symptoms persist or worsen.  Vitamin D Deficiency -Check Vitamin D levels today.  Varicose Veins / Edema Advise patient to continue with elevation and consider compression stockings.  General Health Maintenance -Consider Prevnar 20 updated pneumonia shot. -Schedule mammogram when convenient [facility] -Check labs today.        Orders Placed This Encounter  Procedures   MM 3D SCREENING MAMMOGRAM BILATERAL BREAST    Standing Status:   Future    Standing Expiration Date:   10/09/2024    Order Specific Question:   Reason for Exam (SYMPTOM  OR DIAGNOSIS REQUIRED)    Answer:   Screening bilateral 3D Mammogram [place]    Order Specific Question:   Preferred imaging location?    Answer:   [facility]   Hemoglobin A1c   Lipid panel    Order Specific Question:   Has the patient fasted?    Answer:   Yes   COMPLETE METABOLIC PANEL WITH GFR   CBC with Differential/Platelet   TSH   T4, free   VITAMIN D 25 Hydroxy (Vit-D Deficiency, Fractures)     No orders of the defined types were placed in this encounter.    Follow up plan: Return in 1 year (on 10/09/2024), or if symptoms worsen or fail to improve.  Saralyn Pilar, DO [facility] 10/10/2023, 10:13 AM

## 2023-10-10 NOTE — Patient Instructions (Addendum)
   Thank you for coming to the office today.  Future consider Prevnar-20 updated pneumonia shot. Covered, at any pharmacy or here.  For Mammogram screening for breast cancer   Call the Imaging Center below anytime to schedule your own appointment now that order has been placed.  [facility] [address], [address] [place], [place] [address] Phone: [phone]  Keep up with conservative treatment on the finger cyst. I agree - I would avoid fusion.  Let me know when ready for refills to local [facility] [employer] [employer] or [employer].  Labs today, stay tuned for results.  Please schedule a Follow-up Appointment to: Return if symptoms worsen or fail to improve.  If you have any other questions or concerns, please feel free to call the office or send a message through MyChart. You may also schedule an earlier appointment if necessary.  Additionally, you may be receiving a survey about your experience at our office within a few days to 1 week by e-mail or mail. We value your feedback.  Saralyn Pilar, DO [facility] [place], [place]

## 2023-10-11 LAB — CBC WITH DIFFERENTIAL/PLATELET
Absolute Lymphocytes: 2022 {cells}/uL (ref 850–3900)
Absolute Monocytes: 491 {cells}/uL (ref 200–950)
Basophils Absolute: 82 {cells}/uL (ref 0–200)
Basophils Relative: 1.3 %
Eosinophils Absolute: 258 {cells}/uL (ref 15–500)
Eosinophils Relative: 4.1 %
HCT: 38.4 % (ref 35.0–45.0)
Hemoglobin: 12.4 g/dL (ref 11.7–15.5)
MCH: 29.7 pg (ref 27.0–33.0)
MCHC: 32.3 g/dL (ref 32.0–36.0)
MCV: 92.1 fL (ref 80.0–100.0)
MPV: 13 fL — ABNORMAL HIGH (ref 7.5–12.5)
Monocytes Relative: 7.8 %
Neutro Abs: 3446 {cells}/uL (ref 1500–7800)
Neutrophils Relative %: 54.7 %
Platelets: 192 10*3/uL (ref 140–400)
RBC: 4.17 10*6/uL (ref 3.80–5.10)
RDW: 12.6 % (ref 11.0–15.0)
Total Lymphocyte: 32.1 %
WBC: 6.3 10*3/uL (ref 3.8–10.8)

## 2023-10-11 LAB — VITAMIN D 25 HYDROXY (VIT D DEFICIENCY, FRACTURES): Vit D, 25-Hydroxy: 38 ng/mL (ref 30–100)

## 2023-10-11 LAB — COMPLETE METABOLIC PANEL WITH GFR
AG Ratio: 2.2 (calc) (ref 1.0–2.5)
ALT: 15 U/L (ref 6–29)
AST: 14 U/L (ref 10–35)
Albumin: 4.2 g/dL (ref 3.6–5.1)
Alkaline phosphatase (APISO): 99 U/L (ref 37–153)
BUN: 16 mg/dL (ref 7–25)
CO2: 27 mmol/L (ref 20–32)
Calcium: 9.2 mg/dL (ref 8.6–10.4)
Chloride: 106 mmol/L (ref 98–110)
Creat: 0.95 mg/dL (ref 0.60–1.00)
Globulin: 1.9 g/dL (ref 1.9–3.7)
Glucose, Bld: 101 mg/dL (ref 65–139)
Potassium: 4.1 mmol/L (ref 3.5–5.3)
Sodium: 142 mmol/L (ref 135–146)
Total Bilirubin: 0.7 mg/dL (ref 0.2–1.2)
Total Protein: 6.1 g/dL (ref 6.1–8.1)
eGFR: 64 mL/min/{1.73_m2} (ref >=60)

## 2023-10-11 LAB — HEMOGLOBIN A1C
Hgb A1c MFr Bld: 5.8 %{Hb} — ABNORMAL HIGH (ref <5.7)
Mean Plasma Glucose: 120 mg/dL
eAG (mmol/L): 6.6 mmol/L

## 2023-10-11 LAB — LIPID PANEL
Cholesterol: 250 mg/dL — ABNORMAL HIGH (ref <200)
HDL: 68 mg/dL (ref >=50)
LDL Cholesterol (Calc): 158 mg/dL — ABNORMAL HIGH
Non-HDL Cholesterol (Calc): 182 mg/dL — ABNORMAL HIGH (ref <130)
Total CHOL/HDL Ratio: 3.7 (calc) (ref <5.0)
Triglycerides: 120 mg/dL (ref <150)

## 2023-10-11 LAB — T4, FREE: Free T4: 1.7 ng/dL (ref 0.8–1.8)

## 2023-10-11 LAB — TSH: TSH: 0.5 m[IU]/L (ref 0.40–4.50)

## 2023-10-21 DIAGNOSIS — K080 Exfoliation of teeth due to systemic causes: Secondary | ICD-10-CM | POA: Diagnosis not present

## 2023-10-24 ENCOUNTER — Other Ambulatory Visit: Payer: Self-pay | Admitting: Family Medicine

## 2023-10-25 NOTE — Telephone Encounter (Signed)
Requested Prescriptions  Pending Prescriptions Disp Refills   hydrochlorothiazide (HYDRODIURIL) 12.5 MG tablet [Pharmacy Med Name: hydroCHLOROthiazide 12.5 MG Oral Tablet] 90 tablet 1    Sig: TAKE 1 TABLET BY MOUTH DAILY     Cardiovascular: Diuretics - Thiazide Passed - 10/24/2023 10:26 PM      Passed - Cr in normal range and within 180 days    Creat  Date Value Ref Range Status  10/10/2023 0.95 0.60 - 1.00 mg/dL Final         Passed - K in normal range and within 180 days    Potassium  Date Value Ref Range Status  10/10/2023 4.1 3.5 - 5.3 mmol/L Final  10/29/2013 3.6 3.5 - 5.1 mmol/L Final         Passed - Na in normal range and within 180 days    Sodium  Date Value Ref Range Status  10/10/2023 142 135 - 146 mmol/L Final  08/30/2021 141 134 - 144 mmol/L Final  10/29/2013 140 136 - 145 mmol/L Final         Passed - Last BP in normal range    BP Readings from Last 1 Encounters:  10/10/23 134/60         Passed - Valid encounter within last 6 months    Recent Outpatient Visits           2 weeks ago Annual physical exam   [place] [facility] Smitty Cords, DO   5 months ago Essential hypertension   [place] [facility] Smitty Cords, DO   1 year ago Annual physical exam   [place] [facility] Smitty Cords, DO   1 year ago Left medial knee pain   [place] [facility] Smitty Cords, DO   2 years ago Annual physical exam   [facility] [place], [place], [place]

## 2023-12-16 DIAGNOSIS — R2232 Localized swelling, mass and lump, left upper limb: Secondary | ICD-10-CM | POA: Diagnosis not present

## 2023-12-16 DIAGNOSIS — M71342 Other bursal cyst, left hand: Secondary | ICD-10-CM | POA: Diagnosis not present

## 2023-12-16 DIAGNOSIS — M67442 Ganglion, left hand: Secondary | ICD-10-CM | POA: Diagnosis not present

## 2024-01-28 DIAGNOSIS — L02512 Cutaneous abscess of left hand: Secondary | ICD-10-CM | POA: Diagnosis not present

## 2024-01-28 DIAGNOSIS — Z6831 Body mass index (BMI) 31.0-31.9, adult: Secondary | ICD-10-CM | POA: Diagnosis not present

## 2024-08-21 ENCOUNTER — Ambulatory Visit: Payer: Medicare Other
